# Patient Record
Sex: Male | Born: 1935 | Race: White | Hispanic: No | Smoking: Former smoker
Health system: Southern US, Community
[De-identification: ages and names within clinical notes are randomized; demographics above are authoritative.]

## PROBLEM LIST (undated history)

## (undated) DIAGNOSIS — N138 Other obstructive and reflux uropathy: Secondary | ICD-10-CM

## (undated) DIAGNOSIS — J310 Chronic rhinitis: Secondary | ICD-10-CM

## (undated) DIAGNOSIS — I8312 Varicose veins of left lower extremity with inflammation: Secondary | ICD-10-CM

## (undated) DIAGNOSIS — M5136 Other intervertebral disc degeneration, lumbar region: Secondary | ICD-10-CM

## (undated) DIAGNOSIS — K625 Hemorrhage of anus and rectum: Secondary | ICD-10-CM

## (undated) DIAGNOSIS — Z862 Personal history of diseases of the blood and blood-forming organs and certain disorders involving the immune mechanism: Secondary | ICD-10-CM

## (undated) DIAGNOSIS — M129 Arthropathy, unspecified: Secondary | ICD-10-CM

## (undated) DIAGNOSIS — Z872 Personal history of diseases of the skin and subcutaneous tissue: Secondary | ICD-10-CM

## (undated) DIAGNOSIS — R413 Other amnesia: Secondary | ICD-10-CM

## (undated) DIAGNOSIS — H9193 Unspecified hearing loss, bilateral: Secondary | ICD-10-CM

## (undated) DIAGNOSIS — H269 Unspecified cataract: Secondary | ICD-10-CM

## (undated) DIAGNOSIS — M1990 Unspecified osteoarthritis, unspecified site: Secondary | ICD-10-CM

## (undated) DIAGNOSIS — F329 Major depressive disorder, single episode, unspecified: Secondary | ICD-10-CM

## (undated) DIAGNOSIS — I679 Cerebrovascular disease, unspecified: Secondary | ICD-10-CM

## (undated) DIAGNOSIS — Z8679 Personal history of other diseases of the circulatory system: Secondary | ICD-10-CM

## (undated) DIAGNOSIS — N401 Enlarged prostate with lower urinary tract symptoms: Secondary | ICD-10-CM

## (undated) DIAGNOSIS — E785 Hyperlipidemia, unspecified: Secondary | ICD-10-CM

## (undated) DIAGNOSIS — N182 Chronic kidney disease, stage 2 (mild): Secondary | ICD-10-CM

## (undated) DIAGNOSIS — L280 Lichen simplex chronicus: Secondary | ICD-10-CM

## (undated) DIAGNOSIS — I2510 Atherosclerotic heart disease of native coronary artery without angina pectoris: Secondary | ICD-10-CM

## (undated) DIAGNOSIS — L509 Urticaria, unspecified: Secondary | ICD-10-CM

## (undated) HISTORY — DX: Chronic rhinitis: J31.0

## (undated) HISTORY — DX: Varicose veins of left lower extremity with inflammation: I83.12

## (undated) HISTORY — DX: Cerebrovascular disease, unspecified: I67.9

## (undated) HISTORY — DX: Other obstructive and reflux uropathy: N40.1

## (undated) HISTORY — PX: CORONARY ANGIOPLASTY WITH STENT PLACEMENT: SHX49

## (undated) HISTORY — PX: APPENDECTOMY: SHX54

## (undated) HISTORY — DX: Lichen simplex chronicus: L28.0

## (undated) HISTORY — DX: Other intervertebral disc degeneration, lumbar region: M51.36

## (undated) HISTORY — PX: TONSILLECTOMY: SUR1361

## (undated) HISTORY — DX: Benign prostatic hyperplasia with lower urinary tract symptoms: N13.8

## (undated) HISTORY — DX: Chronic kidney disease, stage 2 (mild): N18.2

## (undated) HISTORY — DX: Personal history of diseases of the skin and subcutaneous tissue: Z87.2

## (undated) HISTORY — DX: Personal history of diseases of the blood and blood-forming organs and certain disorders involving the immune mechanism: Z86.2

## (undated) HISTORY — DX: Unspecified hearing loss, bilateral: H91.93

## (undated) HISTORY — DX: Major depressive disorder, single episode, unspecified: F32.9

## (undated) HISTORY — DX: Unspecified osteoarthritis, unspecified site: M19.90

## (undated) HISTORY — DX: Urticaria, unspecified: L50.9

## (undated) HISTORY — DX: Unspecified cataract: H26.9

## (undated) HISTORY — DX: Hyperlipidemia, unspecified: E78.5

## (undated) HISTORY — DX: Other amnesia: R41.3

## (undated) HISTORY — DX: Atherosclerotic heart disease of native coronary artery without angina pectoris: I25.10

## (undated) HISTORY — DX: Hemorrhage of anus and rectum: K62.5

## (undated) HISTORY — DX: Personal history of other diseases of the circulatory system: Z86.79

## (undated) HISTORY — DX: Arthropathy, unspecified: M12.9

---

## 2001-12-23 ENCOUNTER — Emergency Department (HOSPITAL_COMMUNITY): Admission: EM | Admit: 2001-12-23 | Discharge: 2001-12-23 | Payer: Self-pay

## 2002-04-20 ENCOUNTER — Ambulatory Visit (HOSPITAL_COMMUNITY): Admission: RE | Admit: 2002-04-20 | Discharge: 2002-04-20 | Payer: Self-pay | Admitting: Internal Medicine

## 2002-04-20 ENCOUNTER — Encounter: Payer: Self-pay | Admitting: Internal Medicine

## 2003-12-04 ENCOUNTER — Ambulatory Visit: Payer: Self-pay | Admitting: Internal Medicine

## 2004-01-17 ENCOUNTER — Ambulatory Visit: Payer: Self-pay | Admitting: Internal Medicine

## 2004-05-23 ENCOUNTER — Ambulatory Visit: Payer: Self-pay | Admitting: Internal Medicine

## 2004-06-06 ENCOUNTER — Ambulatory Visit: Payer: Self-pay | Admitting: Internal Medicine

## 2004-06-27 ENCOUNTER — Ambulatory Visit: Payer: Self-pay | Admitting: Internal Medicine

## 2004-07-28 ENCOUNTER — Ambulatory Visit: Payer: Self-pay | Admitting: Internal Medicine

## 2004-07-29 ENCOUNTER — Ambulatory Visit: Payer: Self-pay | Admitting: Psychiatry

## 2004-07-29 ENCOUNTER — Other Ambulatory Visit (HOSPITAL_COMMUNITY): Admission: RE | Admit: 2004-07-29 | Discharge: 2004-08-18 | Payer: Self-pay | Admitting: Psychiatry

## 2005-04-22 ENCOUNTER — Ambulatory Visit: Payer: Self-pay | Admitting: Internal Medicine

## 2005-05-01 ENCOUNTER — Ambulatory Visit: Payer: Self-pay | Admitting: Internal Medicine

## 2005-05-07 ENCOUNTER — Encounter: Admission: RE | Admit: 2005-05-07 | Discharge: 2005-05-07 | Payer: Self-pay | Admitting: Internal Medicine

## 2005-07-29 ENCOUNTER — Ambulatory Visit: Payer: Self-pay | Admitting: Internal Medicine

## 2006-05-14 ENCOUNTER — Ambulatory Visit: Payer: Self-pay | Admitting: Internal Medicine

## 2006-05-14 LAB — CONVERTED CEMR LAB
CO2: 30 meq/L (ref 19–32)
GFR calc Af Amer: 120 mL/min
Glucose, Bld: 108 mg/dL — ABNORMAL HIGH (ref 70–99)
HDL: 39.3 mg/dL (ref >39.0)
LDL Cholesterol: 93 mg/dL (ref 0–99)
PSA: 0.25 ng/mL (ref 0.10–4.00)
Sodium: 143 meq/L (ref 135–145)
Total CHOL/HDL Ratio: 3.7

## 2006-12-06 ENCOUNTER — Encounter: Payer: Self-pay | Admitting: Internal Medicine

## 2006-12-06 ENCOUNTER — Ambulatory Visit: Payer: Self-pay | Admitting: Internal Medicine

## 2006-12-06 DIAGNOSIS — I2510 Atherosclerotic heart disease of native coronary artery without angina pectoris: Secondary | ICD-10-CM | POA: Insufficient documentation

## 2006-12-06 DIAGNOSIS — M129 Arthropathy, unspecified: Secondary | ICD-10-CM | POA: Insufficient documentation

## 2006-12-06 DIAGNOSIS — Z8679 Personal history of other diseases of the circulatory system: Secondary | ICD-10-CM

## 2006-12-06 DIAGNOSIS — F329 Major depressive disorder, single episode, unspecified: Secondary | ICD-10-CM

## 2006-12-06 DIAGNOSIS — E785 Hyperlipidemia, unspecified: Secondary | ICD-10-CM

## 2006-12-06 DIAGNOSIS — N400 Enlarged prostate without lower urinary tract symptoms: Secondary | ICD-10-CM | POA: Insufficient documentation

## 2006-12-06 DIAGNOSIS — F3289 Other specified depressive episodes: Secondary | ICD-10-CM | POA: Insufficient documentation

## 2006-12-06 DIAGNOSIS — I679 Cerebrovascular disease, unspecified: Secondary | ICD-10-CM

## 2006-12-06 DIAGNOSIS — I2583 Coronary atherosclerosis due to lipid rich plaque: Secondary | ICD-10-CM

## 2006-12-06 HISTORY — DX: Personal history of other diseases of the circulatory system: Z86.79

## 2006-12-06 HISTORY — DX: Other specified depressive episodes: F32.89

## 2006-12-06 HISTORY — DX: Major depressive disorder, single episode, unspecified: F32.9

## 2007-01-31 ENCOUNTER — Telehealth: Payer: Self-pay | Admitting: Internal Medicine

## 2007-06-17 ENCOUNTER — Encounter (INDEPENDENT_AMBULATORY_CARE_PROVIDER_SITE_OTHER): Payer: Self-pay | Admitting: Emergency Medicine

## 2007-06-17 ENCOUNTER — Ambulatory Visit: Payer: Self-pay | Admitting: Vascular Surgery

## 2007-06-17 ENCOUNTER — Emergency Department (HOSPITAL_COMMUNITY): Admission: EM | Admit: 2007-06-17 | Discharge: 2007-06-17 | Payer: Self-pay | Admitting: Emergency Medicine

## 2007-06-17 ENCOUNTER — Telehealth: Payer: Self-pay | Admitting: Internal Medicine

## 2007-06-20 ENCOUNTER — Telehealth (INDEPENDENT_AMBULATORY_CARE_PROVIDER_SITE_OTHER): Payer: Self-pay | Admitting: *Deleted

## 2007-07-06 DIAGNOSIS — I8312 Varicose veins of left lower extremity with inflammation: Secondary | ICD-10-CM

## 2007-07-06 HISTORY — DX: Varicose veins of left lower extremity with inflammation: I83.12

## 2007-07-07 ENCOUNTER — Ambulatory Visit: Payer: Self-pay | Admitting: Internal Medicine

## 2007-07-07 DIAGNOSIS — L02619 Cutaneous abscess of unspecified foot: Secondary | ICD-10-CM

## 2007-07-07 DIAGNOSIS — L03119 Cellulitis of unspecified part of limb: Secondary | ICD-10-CM

## 2007-07-18 ENCOUNTER — Ambulatory Visit: Payer: Self-pay | Admitting: Internal Medicine

## 2007-07-26 ENCOUNTER — Telehealth: Payer: Self-pay | Admitting: Internal Medicine

## 2007-08-01 ENCOUNTER — Ambulatory Visit: Payer: Self-pay | Admitting: Infectious Disease

## 2007-08-01 ENCOUNTER — Encounter: Payer: Self-pay | Admitting: Infectious Disease

## 2007-08-01 ENCOUNTER — Ambulatory Visit: Payer: Self-pay | Admitting: Vascular Surgery

## 2007-08-01 ENCOUNTER — Ambulatory Visit (HOSPITAL_COMMUNITY): Admission: RE | Admit: 2007-08-01 | Discharge: 2007-08-01 | Payer: Self-pay | Admitting: Infectious Disease

## 2007-08-01 DIAGNOSIS — H25019 Cortical age-related cataract, unspecified eye: Secondary | ICD-10-CM

## 2007-08-01 DIAGNOSIS — R413 Other amnesia: Secondary | ICD-10-CM

## 2007-08-01 LAB — CONVERTED CEMR LAB
Basophils Absolute: 0 10*3/uL (ref 0.0–0.1)
Basophils Relative: 0 % (ref 0–1)
CO2: 25 meq/L (ref 19–32)
Chloride: 104 meq/L (ref 96–112)
Eosinophils Absolute: 0.3 10*3/uL (ref 0.0–0.7)
Eosinophils Relative: 6 % — ABNORMAL HIGH (ref 0–5)
Glucose, Bld: 99 mg/dL (ref 70–99)
Hemoglobin: 14.3 g/dL (ref 13.0–17.0)
Lymphocytes Relative: 22 % (ref 12–46)
Lymphs Abs: 1.2 10*3/uL (ref 0.7–4.0)
MCV: 94.1 fL (ref 78.0–100.0)
Monocytes Absolute: 0.6 10*3/uL (ref 0.1–1.0)
Monocytes Relative: 12 % (ref 3–12)
Platelets: 226 10*3/uL (ref 150–400)
RBC: 4.76 M/uL (ref 4.22–5.81)
RDW: 12.9 % (ref 11.5–15.5)

## 2007-08-09 ENCOUNTER — Ambulatory Visit: Payer: Self-pay | Admitting: Infectious Disease

## 2007-08-09 DIAGNOSIS — B351 Tinea unguium: Secondary | ICD-10-CM

## 2007-08-09 DIAGNOSIS — I8310 Varicose veins of unspecified lower extremity with inflammation: Secondary | ICD-10-CM

## 2007-08-10 ENCOUNTER — Telehealth: Payer: Self-pay | Admitting: Internal Medicine

## 2007-08-25 ENCOUNTER — Encounter: Payer: Self-pay | Admitting: Internal Medicine

## 2007-08-30 ENCOUNTER — Encounter: Payer: Self-pay | Admitting: Internal Medicine

## 2007-08-30 ENCOUNTER — Telehealth: Payer: Self-pay | Admitting: Internal Medicine

## 2007-10-24 HISTORY — PX: CATARACT EXTRACTION: SUR2

## 2007-11-07 HISTORY — PX: CATARACT EXTRACTION: SUR2

## 2008-02-28 ENCOUNTER — Telehealth: Payer: Self-pay | Admitting: Internal Medicine

## 2008-03-19 ENCOUNTER — Ambulatory Visit: Payer: Self-pay | Admitting: Internal Medicine

## 2008-03-19 LAB — CONVERTED CEMR LAB
Albumin: 3.7 g/dL (ref 3.5–5.2)
Alkaline Phosphatase: 59 units/L (ref 39–117)
BUN: 28 mg/dL — ABNORMAL HIGH (ref 6–23)
Bilirubin, Direct: 0.1 mg/dL (ref 0.0–0.3)
GFR calc non Af Amer: 99 mL/min
HDL: 33.9 mg/dL — ABNORMAL LOW (ref >39.0)
LDL Cholesterol: 71 mg/dL (ref 0–99)
MCV: 91.4 fL (ref 78.0–100.0)
Monocytes Absolute: 0.5 10*3/uL (ref 0.1–1.0)
Monocytes Relative: 8.3 % (ref 3.0–12.0)
Neutro Abs: 3.6 10*3/uL (ref 1.4–7.7)
Platelets: 183 10*3/uL (ref 150–400)
Sodium: 143 meq/L (ref 135–145)
Total Bilirubin: 0.6 mg/dL (ref 0.3–1.2)
Total Protein: 6.6 g/dL (ref 6.0–8.3)
Vitamin B-12: 375 pg/mL (ref 211–911)
WBC: 5.5 10*3/uL (ref 4.5–10.5)

## 2008-06-12 ENCOUNTER — Telehealth: Payer: Self-pay | Admitting: Internal Medicine

## 2008-12-06 ENCOUNTER — Ambulatory Visit: Payer: Self-pay | Admitting: Internal Medicine

## 2008-12-06 DIAGNOSIS — R310 Gross hematuria: Secondary | ICD-10-CM

## 2008-12-06 DIAGNOSIS — R05 Cough: Secondary | ICD-10-CM

## 2008-12-06 DIAGNOSIS — K921 Melena: Secondary | ICD-10-CM

## 2009-01-29 ENCOUNTER — Telehealth: Payer: Self-pay | Admitting: Internal Medicine

## 2009-06-05 DIAGNOSIS — M5136 Other intervertebral disc degeneration, lumbar region: Secondary | ICD-10-CM

## 2009-06-05 DIAGNOSIS — M51369 Other intervertebral disc degeneration, lumbar region without mention of lumbar back pain or lower extremity pain: Secondary | ICD-10-CM

## 2009-06-05 DIAGNOSIS — M1990 Unspecified osteoarthritis, unspecified site: Secondary | ICD-10-CM

## 2009-06-05 HISTORY — DX: Other intervertebral disc degeneration, lumbar region without mention of lumbar back pain or lower extremity pain: M51.369

## 2009-06-05 HISTORY — DX: Unspecified osteoarthritis, unspecified site: M19.90

## 2009-06-05 HISTORY — DX: Other intervertebral disc degeneration, lumbar region: M51.36

## 2009-06-14 ENCOUNTER — Ambulatory Visit: Payer: Self-pay | Admitting: Internal Medicine

## 2009-06-14 DIAGNOSIS — I872 Venous insufficiency (chronic) (peripheral): Secondary | ICD-10-CM | POA: Insufficient documentation

## 2009-06-25 ENCOUNTER — Encounter: Admission: RE | Admit: 2009-06-25 | Discharge: 2009-06-25 | Payer: Self-pay | Admitting: Chiropractic Medicine

## 2009-10-11 ENCOUNTER — Telehealth: Payer: Self-pay | Admitting: Internal Medicine

## 2009-12-03 ENCOUNTER — Ambulatory Visit: Payer: Self-pay | Admitting: Internal Medicine

## 2009-12-03 DIAGNOSIS — R609 Edema, unspecified: Secondary | ICD-10-CM

## 2009-12-03 DIAGNOSIS — M79609 Pain in unspecified limb: Secondary | ICD-10-CM

## 2009-12-06 ENCOUNTER — Encounter: Payer: Self-pay | Admitting: Internal Medicine

## 2009-12-10 ENCOUNTER — Ambulatory Visit: Payer: Self-pay

## 2009-12-10 ENCOUNTER — Telehealth: Payer: Self-pay | Admitting: Internal Medicine

## 2009-12-10 ENCOUNTER — Encounter: Payer: Self-pay | Admitting: Internal Medicine

## 2009-12-18 ENCOUNTER — Ambulatory Visit: Payer: Self-pay | Admitting: Internal Medicine

## 2009-12-18 DIAGNOSIS — L989 Disorder of the skin and subcutaneous tissue, unspecified: Secondary | ICD-10-CM | POA: Insufficient documentation

## 2010-01-03 ENCOUNTER — Telehealth: Payer: Self-pay | Admitting: Internal Medicine

## 2010-02-04 NOTE — Progress Notes (Signed)
  Phone Note Refill Request Message from:  Fax from Pharmacy on October 11, 2009 11:58 AM  Refills Requested: Medication #1:  MELOXICAM 15 MG  TABS 1 by mouth once daily for arthiritic pain Please Advise refill for pt  Initial call taken by: Ami Bullins CMA,  October 11, 2009 11:58 AM  Follow-up for Phone Call        OK to refill as needed  Follow-up by: Jacques Navy MD,  October 11, 2009 1:29 PM    Prescriptions: MELOXICAM 15 MG  TABS (MELOXICAM) 1 by mouth once daily for arthiritic pain  #30 Each x 2   Entered by:   Bill Salinas CMA   Authorized by:   Jacques Navy MD   Signed by:   Bill Salinas CMA on 10/11/2009   Method used:   Electronically to        [facility]. [address] [phone]* (retail)       [address]       [place], [place]  [address]       Ph: [phone] or [phone]       Fax: [phone]   RxID:   [id number]

## 2010-02-04 NOTE — Assessment & Plan Note (Signed)
Summary: bruised,twisted r leg/men/cd   Vital Signs:  Patient profile:   77 year old male Height:      72 inches Weight:      261 pounds BMI:     35.53 Temp:     98.0 degrees F oral Pulse rate:   80 / minute Pulse rhythm:   regular Resp:     16 per minute BP sitting:   120 / 68  (left arm) Cuff size:   large  Vitals Entered By: Lanier Prude, CMA(AAMA) (December 03, 2009 2:57 PM) CC: Rt leg edema/pain/bruised.chair scooted out from under him he fell 1 wk ago Is Patient Diabetic? No   Primary Care Provider:  Norins  CC:  Rt leg edema/pain/bruised.chair scooted out from under him he fell 1 wk ago.  History of Present Illness: C/o R foot/leg pain and swelling after he fell off his chair (it scooted from underneath him); he hit his head too.  The pain and swelling got worse over past 2 dDenies HA, n/v. He has been active walking etc  Current Medications (verified): 1)  Plavix 75 Mg Tabs (Clopidogrel Bisulfate) .Marland Kitchen.. 1 Once Daily 2)  Simvastatin 80 Mg  Tabs (Simvastatin) .... Once Daily 3)  Adult Aspirin Low Strength 81 Mg  Tbdp (Aspirin) .... Once Daily 4)  Multivitamins   Tabs (Multiple Vitamin) .... Once Daily 5)  Effexor 75 Mg  Tabs (Venlafaxine Hcl) .... Take 1 Tablet By Mouth Two Times A Day 6)  Aspercreme 10 %  Lotn (Trolamine Salicylate) .... As Directed As Needed 7)  Xanax 0.5 Mg Tabs (Alprazolam) .... Take 1 Tablet By Mouth Once A Day 8)  Meloxicam 15 Mg  Tabs (Meloxicam) .Marland Kitchen.. 1 By Mouth Once Daily For Arthiritic Pain 9)  Lasix 20 Mg  Tabs (Furosemide) .... Take 1 Tablet By Mouth Once A Day 10)  Cialis 20 Mg Tabs (Tadalafil) .Marland Kitchen.. 1 Tab Several Times Per Week 11)  Tramadol Hcl 50 Mg Tabs (Tramadol Hcl) .Marland Kitchen.. 1 By Mouth Q8 Hours As Needed For Pain 12)  Metoprolol Succinate 25 Mg Xr24h-Tab (Metoprolol Succinate) .Marland Kitchen.. 1 By Mouth Once Daily For Blood Pressure 13)  Temazepam 15 Mg Caps (Temazepam) .Marland Kitchen.. 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 [place] married 31- divorced, married 9 years-divorce, married 10-divorce.  Had a companion who died. 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  Review of Systems       The patient complains of dyspnea on exertion.  The patient denies fever, chest pain, syncope, abdominal pain, and melena.    Physical Exam  General:  overwight white male in no distress Lungs:  normal respiratory effort and normal breath sounds.   Heart:  normal rate, regular rhythm, no murmur, and no JVD.   Abdomen:  soft, non-tender, normal bowel sounds, no distention, no masses, and no hepatomegaly.   Msk:  see below Extremities:  L ankle is with trace edema R distal 1/2 of his R shin and ankle with 2+ edema with pain and tenderness R anterior ankle is  bruised Neurologic:  Very hard of hearing   Impression & Recommendations:  Problem # 1:  EDEMA (ICD-782.3) R leg - contusion Assessment Deteriorated  His updated medication list for this problem includes:    Lasix 20 Mg Tabs (Furosemide) .Marland Kitchen... Take 1 tablet by mouth once a day Ultram prn Orders: T-Ankle Comp Right (73610TC) to r/o fracture  Doppler Referral (Doppler) to r/o DVT Ace Wraps 3-5 in/yard  (Z6109)  Problem # 2:  LEG PAIN (ICD-729.5) R Assessment: New See "Patient Instructions".  Orders: T-Ankle Comp Right (73610TC) Doppler  Referral (Doppler) Ace Wraps 3-5 in/yard  ([id number])  Problem # 3:  VENOUS INSUFFICIENCY, LEGS (ICD-459.81) Assessment: Deteriorated as above  Complete Medication List: 1)  Plavix 75 Mg Tabs (Clopidogrel bisulfate) .Marland Kitchen.. 1 once daily 2)  Simvastatin 80 Mg Tabs (Simvastatin) .... Once daily 3)  Adult Aspirin Low Strength 81 Mg Tbdp (Aspirin) .... Once daily 4)  Multivitamins Tabs (Multiple vitamin) .... Once daily 5)  Effexor 75 Mg Tabs (Venlafaxine hcl) .... Take 1 tablet by mouth two times a day 6)  Aspercreme 10 % Lotn (Trolamine salicylate) .... As directed as needed 7)  Xanax 0.5 Mg Tabs (Alprazolam) .... Take 1 tablet by mouth once a day 8)  Meloxicam 15 Mg Tabs (Meloxicam) .Marland Kitchen.. 1 by mouth once daily for arthiritic pain 9)  Lasix 20 Mg Tabs (Furosemide) .... Take 1 tablet by mouth once a day 10)  Cialis 20 Mg Tabs (Tadalafil) .Marland Kitchen.. 1 tab several times per week 11)  Tramadol Hcl 50 Mg Tabs (Tramadol hcl) .Marland Kitchen.. 1 by mouth q8 hours as needed for pain 12)  Metoprolol Succinate 25 Mg Xr24h-tab (Metoprolol succinate) .Marland Kitchen.. 1 by mouth once daily for blood pressure 13)  Temazepam 15 Mg Caps (Temazepam) .Marland Kitchen.. 1 by mouth at bedtime  Patient Instructions: 1)  Elevate L leg  2)  Please schedule a follow-up appointment in 1-2 weeks w/Dr Norins. 3)  Call if you are not better in a reasonable amount of time or if worse. Go to ER if feeling really bad!    Orders Added: 1)  T-Ankle Comp Right [73610TC] 2)  Doppler Referral [Doppler] 3)  Ace Wraps 3-5 in/yard  [A6449] 4)  Est. Patient Level IV [09811]

## 2010-02-04 NOTE — Progress Notes (Signed)
  Phone Note Refill Request Message from:  Fax from Pharmacy on January 29, 2009 1:47 PM  Refills Requested: Medication #1:  MELOXICAM 15 MG  TABS 1 by mouth once daily for arthiritic pain   Last Refilled: 12/31/2008 please Advise refill  Initial call taken by: Ami Bullins CMA,  January 29, 2009 1:47 PM  Follow-up for Phone Call        OK for as needed refills Follow-up by: Jacques Navy MD,  January 29, 2009 5:27 PM    Prescriptions: MELOXICAM 15 MG  TABS (MELOXICAM) 1 by mouth once daily for arthiritic pain  #30.0 Each x 0   Entered by:   Bill Salinas CMA   Authorized by:   Jacques Navy MD   Signed by:   Bill Salinas CMA on 01/30/2009   Method used:   Electronically to        [facility]. [address] [phone]* (retail)       [address]       [place], [place]  [address]       Ph: [phone] or [phone]       Fax: [phone]   RxID:   [id number]

## 2010-02-04 NOTE — Progress Notes (Signed)
  Phone Note Other Incoming   Caller: Harriet w/ vascular department Summary of Call: Patient there for study and it is neg for blood clot but look like cellulitis. Per pt he is currently being treated for L leg fungal infection w/ ampicillian ( given by Dr Okey Dupre w/ dermatology).  Per Dr AP, ok to pt to go home until futher advisment. Initial call taken by: Rock Nephew CMA,  December 10, 2009 9:43 AM  Follow-up for Phone Call        Pls ask him to take Lasix 2 a day  Stop Anoxicillin Start Doxy Keep return office visit Dr Debby Bud Follow-up by: Tresa Garter MD,  December 10, 2009 9:46 AM  Additional Follow-up for Phone Call Additional follow up Details #1::        Called pt no ansew [facility] RTC Additional Follow-up by: Orlan Leavens RMA,  December 10, 2009 12:38 PM    Additional Follow-up for Phone Call Additional follow up Details #2::    Pt informed, he is scheduled for f/u wednesday.  Follow-up by: Lamar Sprinkles, CMA,  December 11, 2009 10:15 AM  New/Updated Medications: DOXYCYCLINE HYCLATE 100 MG CAPS (DOXYCYCLINE HYCLATE) 1 by mouth two times a day with a glass of water Prescriptions: DOXYCYCLINE HYCLATE 100 MG CAPS (DOXYCYCLINE HYCLATE) 1 by mouth two times a day with a glass of water  #20 x 0   Entered and Authorized by:   Tresa Garter MD   Signed by:   Rock Nephew CMA on 12/10/2009   Method used:   Electronically to        [facility]. [address] [phone]* (retail)       [address]       [place], [place]  [address]       Ph: [phone] or [phone]       Fax: [phone]   RxID:   [id number]

## 2010-02-04 NOTE — Assessment & Plan Note (Signed)
Summary: swelling in legs and ankles-lb   Vital Signs:  Patient profile:   77 year old male Height:      72 inches Weight:      266 pounds BMI:     36.21 O2 Sat:      95 % on Room air Temp:     97.6 degrees F oral Pulse rate:   98 / minute BP sitting:   150 / 72  (left arm) Cuff size:   large  Vitals Entered By: Lucious Groves (June 14, 2009 5:14 PM)  O2 Flow:  Room air CC: C/O increased swelling in legs and akles./kb Comments Patient also notes that he has bleeding sores./kb   Primary Care Provider:  Davontay Watlington  CC:  C/O increased swelling in legs and akles./kb.  History of Present Illness: Preents for peripheral edema with some weepong of fluid but no ulcers or skin breakdown. He does not have active heart or renal failure. He has been feeling OK. His companion verifies that he has been doing OK. He does take furosemide 20mg  daily for BP management.   Current Medications (verified): 1)  Plavix 75 Mg Tabs (Clopidogrel Bisulfate) .Marland Kitchen.. 1 Once Daily 2)  Simvastatin 80 Mg  Tabs (Simvastatin) .... Once Daily 3)  Adult Aspirin Low Strength 81 Mg  Tbdp (Aspirin) .... Once Daily 4)  Multivitamins   Tabs (Multiple Vitamin) .... Once Daily 5)  Effexor 75 Mg  Tabs (Venlafaxine Hcl) .... Take 1 Tablet By Mouth Two Times A Day 6)  Aspercreme 10 %  Lotn (Trolamine Salicylate) .... As Directed As Needed 7)  Xanax 0.5 Mg Tabs (Alprazolam) .... Take 1 Tablet By Mouth Once A Day 8)  Meloxicam 15 Mg  Tabs (Meloxicam) .Marland Kitchen.. 1 By Mouth Once Daily For Arthiritic Pain 9)  Lasix 20 Mg  Tabs (Furosemide) .... Take 1 Tablet By Mouth Once A Day 10)  Cialis 20 Mg Tabs (Tadalafil) .Marland Kitchen.. 1 Tab Several Times Per Week 11)  Rybix Odt 50 Mg Tbso (Tramadol Hcl) .Marland Kitchen.. 1 By Mouth As Needed For Pain  Allergies (verified): No Known Drug Allergies  Past History:  Past Medical History: Last updated: 03/19/2008 ONYCHOMYCOSIS, TOENAILS (ICD-110.1) DERMATITIS, STASIS (ICD-454.1) MEMORY LOSS (ICD-780.93) CORTICAL  SENILE CATARACT (ICD-366.15) CELLULITIS AND ABSCESS OF FOOT EXCEPT TOES (ICD-682.7) BENIGN PROSTATIC HYPERTROPHY (ICD-600.00) ARTHRITIS (ICD-716.90) CEREBROVASCULAR DISEASE (ICD-437.9) HYPERLIPIDEMIA (ICD-272.4) DEPRESSION (ICD-311) TRANSIENT ISCHEMIC ATTACK, HX OF (ICD-V12.50) CORONARY ARTERY DISEASE (ICD-414.00)  Past Surgical History: Last updated: 03/19/2008 Appendectomy PTCA/stent Tonsillectomy Cataract extraction (10/24/2007)-right Cataract extraction (11/07/2007)-left  Family History: Last updated: Jan 03, 2007 father- died 88, CAD/MI, CVa mother- died 46, HTN sister- mild dementia Neg - colon, prostate cancer, DM  Social History: Last updated: 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  Had a companion who died. 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  Risk Factors: Caffeine Use: 2 (08/09/2007) Exercise: no (2007/01/03)  Risk Factors: Smoking Status: quit (01/03/07) Passive Smoke Exposure: no (08/09/2007)  Review of Systems  The patient denies anorexia, fever, chest pain, dyspnea on exertion, and abdominal pain.    Physical Exam  General:  overwight white male in no distress Head:  normocephalic and atraumatic.   Lungs:  normal respiratory effort and normal breath sounds.   Heart:  normal rate, regular rhythm, no murmur, and no JVD.   Pulses:  2+ radial pulse Extremities:  2+ LE edema to below the knee.  Neurologic:  Very hard of hearing Skin:  No ulceration. Distal left leg with erythematous stiff skin change but no open wound. There is serosanguinous fluid from the leg to touch. Psych:  Oriented X3, memory intact for recent and remote, and good eye contact.     Impression & Recommendations:  Problem # 1:  VENOUS INSUFFICIENCY, LEGS (ICD-459.81) Explained mechanism of disease. Reviewed chart - no evidence to support anasarce,  biventricular failure or renal failure.  Plan - continue BP meds           elevate legs           knee high support hose - otc men's stockings as first step.   Complete Medication List: 1)  Plavix 75 Mg Tabs (Clopidogrel bisulfate) .Marland Kitchen.. 1 once daily 2)  Simvastatin 80 Mg Tabs (Simvastatin) .... Once daily 3)  Adult Aspirin Low Strength 81 Mg Tbdp (Aspirin) .... Once daily 4)  Multivitamins Tabs (Multiple vitamin) .... Once daily 5)  Effexor 75 Mg Tabs (Venlafaxine hcl) .... Take 1 tablet by mouth two times a day 6)  Aspercreme 10 % Lotn (Trolamine salicylate) .... As directed as needed 7)  Xanax 0.5 Mg Tabs (Alprazolam) .... Take 1 tablet by mouth once a day 8)  Meloxicam 15 Mg Tabs (Meloxicam) .Marland Kitchen.. 1 by mouth once daily for arthiritic pain 9)  Lasix 20 Mg Tabs (Furosemide) .... Take 1 tablet by mouth once a day 10)  Cialis 20 Mg Tabs (Tadalafil) .Marland Kitchen.. 1 tab several times per week 11)  Tramadol Hcl 50 Mg Tabs (Tramadol hcl) .Marland Kitchen.. 1 by mouth q8 hours as needed for pain 12)  Metoprolol Succinate 25 Mg Xr24h-tab (Metoprolol succinate) .Marland Kitchen.. 1 by mouth once daily for blood pressure  Patient Instructions: 1)  Leg swelling - most likely venous insufficiency = a problem with the venous circulation. Treatment is mechanical: elevate your legs higher than your heart for every 3 hrs; knee high support stockings (over the counter) that you put on first thing in the morning. 2)  Blood pressure - 150/72 too high, especially for a man with known coronary heart disease. Plan - continue lasix 20mg  daily (for blood pressure not swelling), add  metoprolol xr 25 mg once a day for control and also cardiac benefit. 3)  For arthritis pain - DO NOT take any supplemental anti-inflammatory drugs, e.g. naproxen, ibuprofen in addition to the meloxicam. OK to take tylenol up to 1000mg  three times a day as needed. OK for tramadol 50mg  every 8 hours as needed. Prescriptions: TRAMADOL HCL 50 MG TABS (TRAMADOL HCL) 1 by  mouth q8 hours as needed for pain  #30 x 5   Entered and Authorized by:   Jacques Navy MD   Signed by:   Jacques Navy MD on 06/14/2009   Method used:   Electronically to        [facility] [address] [phone]* (retail)       [address]       [place], [place]  [address]       Ph: [phone] or [phone]       Fax: [phone]   RxID:   [id number] METOPROLOL SUCCINATE 25 MG XR24H-TAB (METOPROLOL SUCCINATE) 1 by mouth once daily for blood pressure  #30 x 12   Entered and Authorized by:   Jacques Navy MD   Signed by:   Jacques Navy MD on 06/14/2009   Method used:  Electronically to        [facility]. [address] [phone]* (retail)       [address]       [place], [place]  [address]       Ph: [phone] or [phone]       Fax: [phone]   RxID:   [id number]

## 2010-02-04 NOTE — Miscellaneous (Signed)
Summary: Orders Update  Clinical Lists Changes  Orders: Added new Test order of Venous Duplex Lower Extremity (Venous Duplex Lower) - Signed 

## 2010-02-06 NOTE — Progress Notes (Signed)
  Phone Note Other Incoming   Caller: Pharm Summary of Call: Pharm called to get clarification on Furosemide 20 mg. We had filled it for 20mg  once a day and the pt states he takes 20 mg twice a day. I spoke with Dr Debby Bud and he advise ok for Furosemide 20mg  two times a day . Informed pharmacist. Initial call taken by: Ami Bullins CMA,  January 03, 2010 5:00 PM

## 2010-02-06 NOTE — Assessment & Plan Note (Signed)
Summary: 1-2 WK ROV /NWS   Vital Signs:  Patient profile:   77 year old male Height:      72 inches Weight:      259 pounds BMI:     35.25 O2 Sat:      98 % on Room air Temp:     98.0 degrees F oral Pulse rate:   70 / minute BP sitting:   132 / 72  (left arm) Cuff size:   large  Vitals Entered By: Bill Salinas CMA (December 18, 2009 10:49 AM)  O2 Flow:  Room air CC: pt here for f/u from fall brusing right leg. Pt had lower venous examination  12/10/2009 with no evidence of right lower extremity deep or superficial venous thrombus or incompetence. Pt was given ampicillin by Dr Okey Dupre for cellulitis and Dr Macario Golds changed that to doxycycline, pt has also been taking 2 furosemides instead of 1 / ab   Primary Care Provider:  Norins  CC:  pt here for f/u from fall brusing right leg. Pt had lower venous examination  12/10/2009 with no evidence of right lower extremity deep or superficial venous thrombus or incompetence. Pt was given ampicillin by Dr Okey Dupre for cellulitis and Dr Macario Golds changed that to doxycycline and pt has also been taking 2 furosemides instead of 1 / ab.  History of Present Illness: Patient presents for follow-up of cellulitis. He was seen by Dr. AVP a bout 1 week ago. He had x-ray right ankle - reveiwed report - negative. He had lower extremity venous doppler study - report reviewed- negative. He was started on doxycycline for cellulitis of the right leg . His legs feel  better. The bruising from his fall are resolved (resolveing). He has had no fever or chills or significant leg pain.   Current Medications (verified): 1)  Plavix 75 Mg Tabs (Clopidogrel Bisulfate) .Marland Kitchen.. 1 Once Daily 2)  Simvastatin 80 Mg  Tabs (Simvastatin) .... Once Daily 3)  Adult Aspirin Low Strength 81 Mg  Tbdp (Aspirin) .... Once Daily 4)  Multivitamins   Tabs (Multiple Vitamin) .... Once Daily 5)  Effexor 75 Mg  Tabs (Venlafaxine Hcl) .... Take 1 Tablet By Mouth Two Times A Day 6)  Aspercreme 10 %  Lotn  (Trolamine Salicylate) .... As Directed As Needed 7)  Xanax 0.5 Mg Tabs (Alprazolam) .... Take 1 Tablet By Mouth Once A Day 8)  Meloxicam 15 Mg  Tabs (Meloxicam) .Marland Kitchen.. 1 By Mouth Once Daily For Arthiritic Pain 9)  Lasix 20 Mg  Tabs (Furosemide) .... Take 1 Tablet By Mouth Once A Day 10)  Cialis 20 Mg Tabs (Tadalafil) .Marland Kitchen.. 1 Tab Several Times Per Week 11)  Tramadol Hcl 50 Mg Tabs (Tramadol Hcl) .Marland Kitchen.. 1 By Mouth Q8 Hours As Needed For Pain 12)  Metoprolol Succinate 25 Mg Xr24h-Tab (Metoprolol Succinate) .Marland Kitchen.. 1 By Mouth Once Daily For Blood Pressure 13)  Temazepam 15 Mg Caps (Temazepam) .Marland Kitchen.. 1 By Mouth At Bedtime 14)  Doxycycline Hyclate 100 Mg Caps (Doxycycline Hyclate) .Marland Kitchen.. 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) CELLULITIS AND ABSCESS OF FOOT EXCEPT TOES (ICD-682.7) BENIGN PROSTATIC HYPERTROPHY (ICD-600.00) ARTHRITIS (ICD-716.90) CEREBROVASCULAR DISEASE (ICD-437.9) HYPERLIPIDEMIA (ICD-272.4) DEPRESSION (ICD-311) TRANSIENT ISCHEMIC ATTACK, HX OF (ICD-V12.50) CORONARY ARTERY DISEASE (ICD-414.00)  Past Surgical History: Last updated: 03/19/2008 Appendectomy  PTCA/stent Tonsillectomy Cataract extraction (10/24/2007)-right Cataract extraction (11/07/2007)-left FH reviewed for relevance, SH/Risk Factors reviewed for relevance  Review of Systems       The patient complains of decreased hearing and peripheral edema.  The patient denies anorexia, fever, weight loss, weight gain, chest pain, dyspnea on exertion, prolonged cough, abdominal pain, muscle weakness, difficulty walking, enlarged lymph nodes, and angioedema.    Physical Exam  General:  heavy-set white male in no distress Head:  normocephalic and atraumatic.   Eyes:  vision  grossly intact, pupils equal, and pupils round.   Neck:  supple.   Lungs:  normal respiratory effort.   Heart:  normal rate and regular rhythm.   Extremities:  1-2 + LE edema Neurologic:  alert & oriented X3 and cranial nerves II-XII intact.   Skin:  area of hyperemia on the shin of both legs with minimal warmth.  Several erythematous lesions on the face withou signs of obvious secondary infection.  Psych:  Oriented X3, memory intact for recent and remote, normally interactive, and good eye contact.     Impression & Recommendations:  Problem # 1:  LEG PAIN (ICD-729.5) Infection appears to have resolved.  No further antibiotics for cellulitis is needed.   To prevent further problems and swelling - continue to use knee high support hose.  Problem # 2:  DERMATITIS, STASIS (ICD-454.1) see #1  Problem # 3:  SKIN LESIONS, MULTIPLE (ICD-709.9) IN regard to the continued outbreak of lesions on the face and back best to stick with "one cook" in this case the dermatologist. They can decide if you need chronic antibiotics and if so which one.   Complete Medication List: 1)  Plavix 75 Mg Tabs (Clopidogrel bisulfate) .Marland Kitchen.. 1 once daily 2)  Simvastatin 80 Mg Tabs (Simvastatin) .... Once daily 3)  Adult Aspirin Low Strength 81 Mg Tbdp (Aspirin) .... Once daily 4)  Multivitamins Tabs (Multiple vitamin) .... Once daily 5)  Effexor 75 Mg Tabs (Venlafaxine hcl) .... Take 1 tablet by mouth two times a day 6)  Aspercreme 10 % Lotn (Trolamine salicylate) .... As directed as needed 7)  Xanax 0.5 Mg Tabs (Alprazolam) .... Take 1 tablet by mouth once a day 8)  Meloxicam 15 Mg Tabs (Meloxicam) .Marland Kitchen.. 1 by mouth once daily for arthiritic pain 9)  Lasix 20 Mg Tabs (Furosemide) .... Take 1 tablet by mouth once a day 10)  Cialis 20 Mg Tabs (Tadalafil) .Marland Kitchen.. 1 tab several times per week 11)  Tramadol Hcl 50 Mg Tabs (Tramadol hcl) .Marland Kitchen.. 1 by mouth q8 hours as needed for pain 12)  Metoprolol Succinate 25 Mg Xr24h-tab  (Metoprolol succinate) .Marland Kitchen.. 1 by mouth once daily for blood pressure 13)  Temazepam 15 Mg Caps (Temazepam) .Marland Kitchen.. 1 by mouth at bedtime   Orders Added: 1)  Est. Patient Level III [27253]

## 2010-04-10 ENCOUNTER — Other Ambulatory Visit: Payer: Self-pay | Admitting: Internal Medicine

## 2010-05-02 IMAGING — CR DG FOOT COMPLETE 3+V*L*
3 series · 3 of 3 positions shown · non-contrast
Comparison: None available.

CLINICAL DATA: Fall.  Pain.

LEFT FOOT - COMPLETE 3+ VIEW

[view not recorded (1 of 3)]
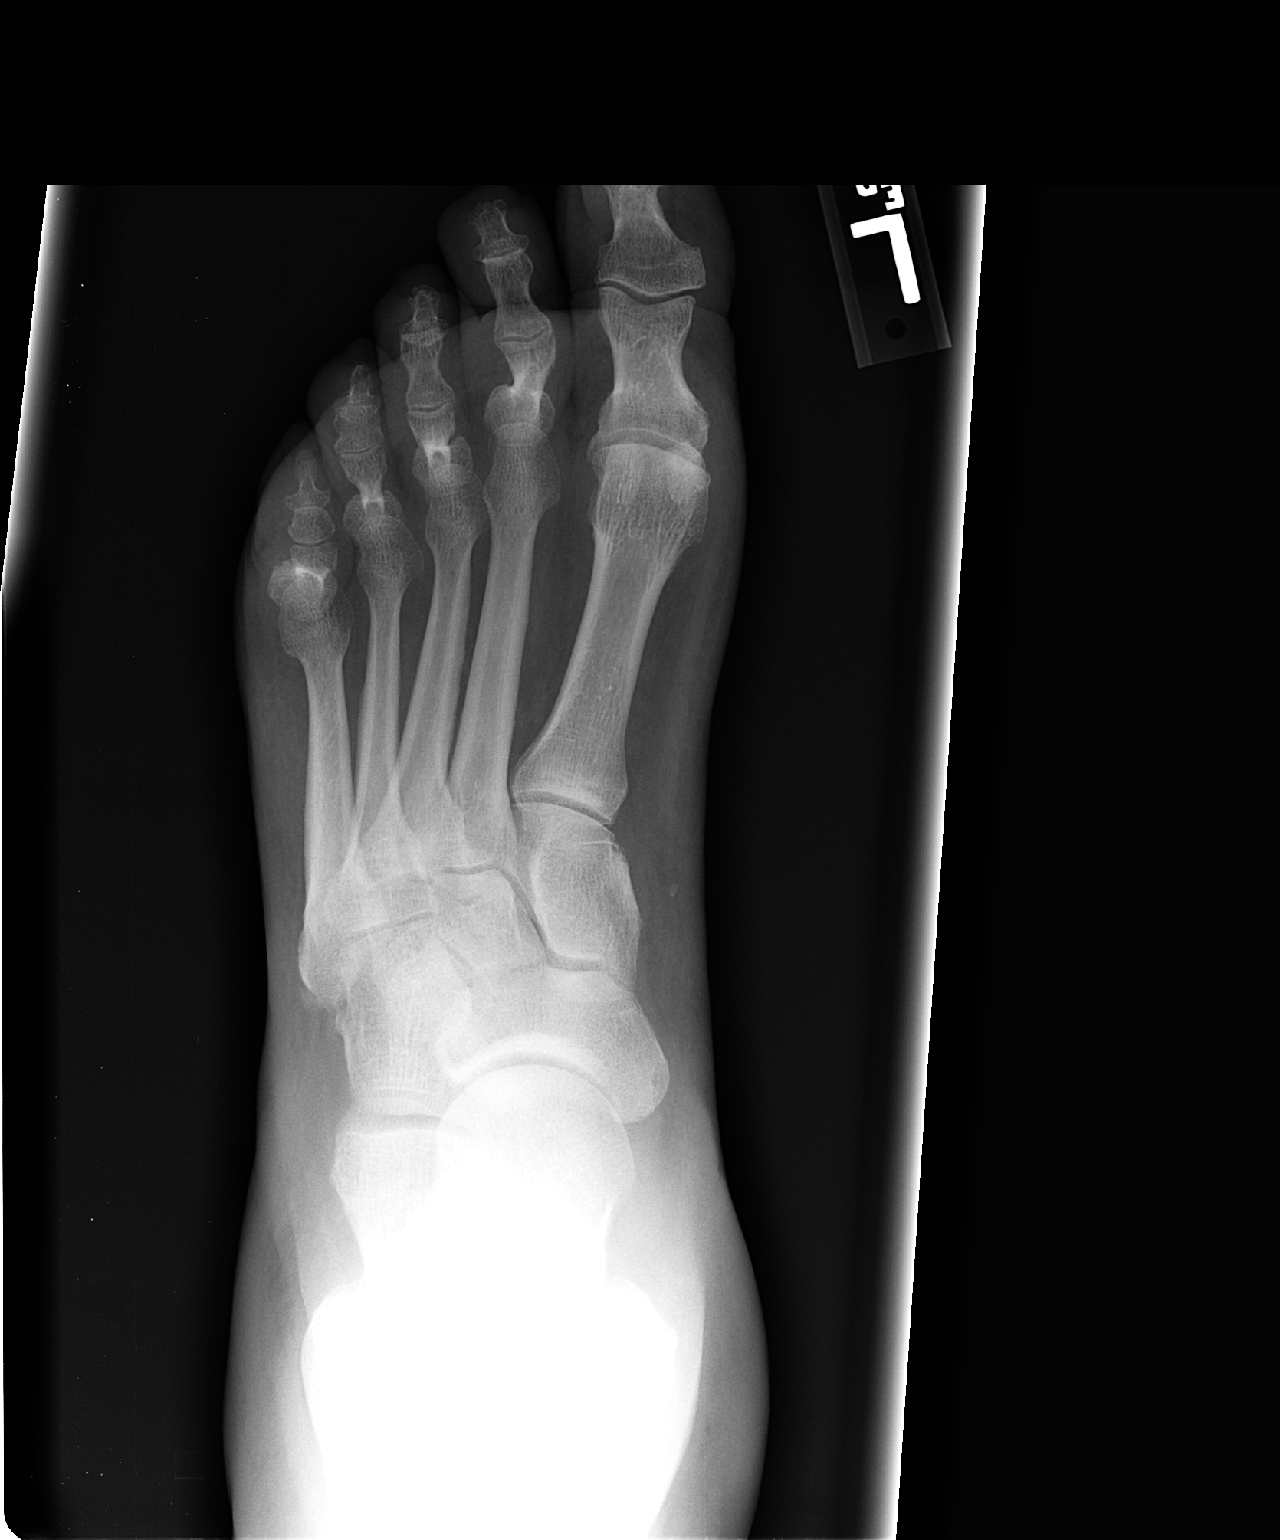

[view not recorded (2 of 3)]
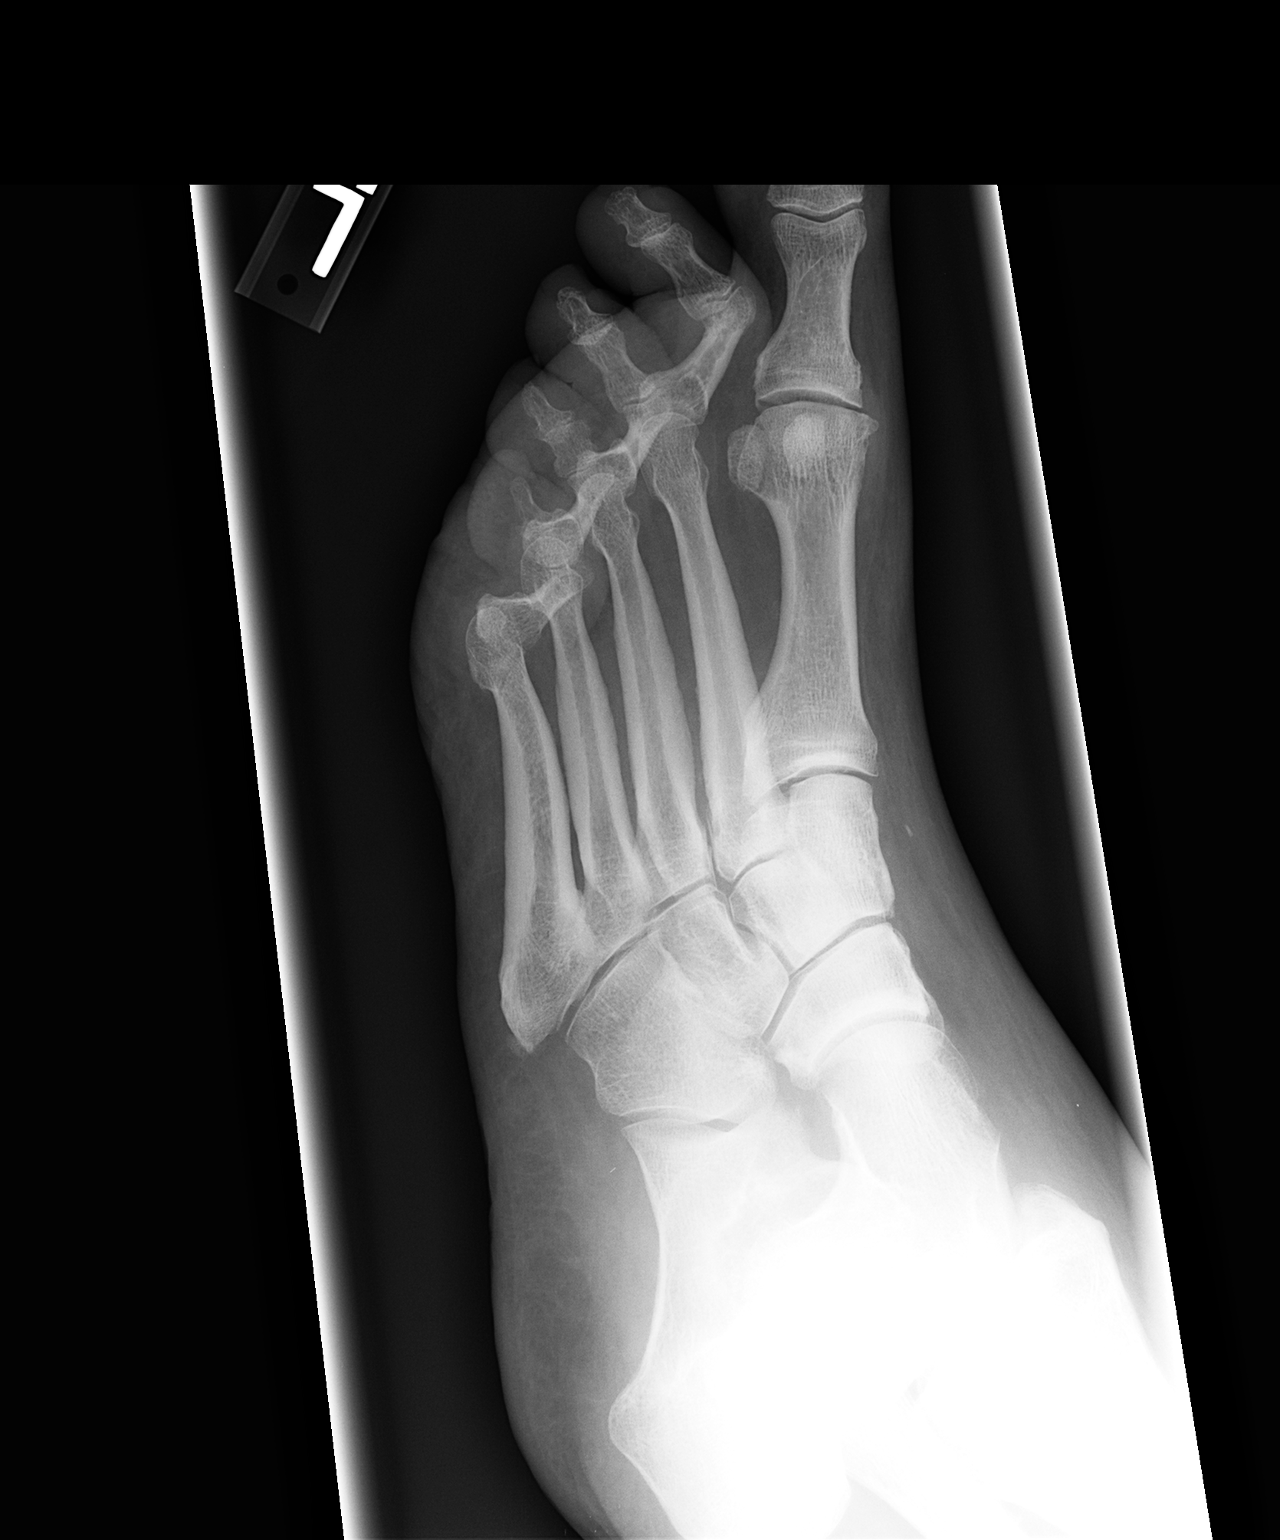

[view not recorded (3 of 3)]
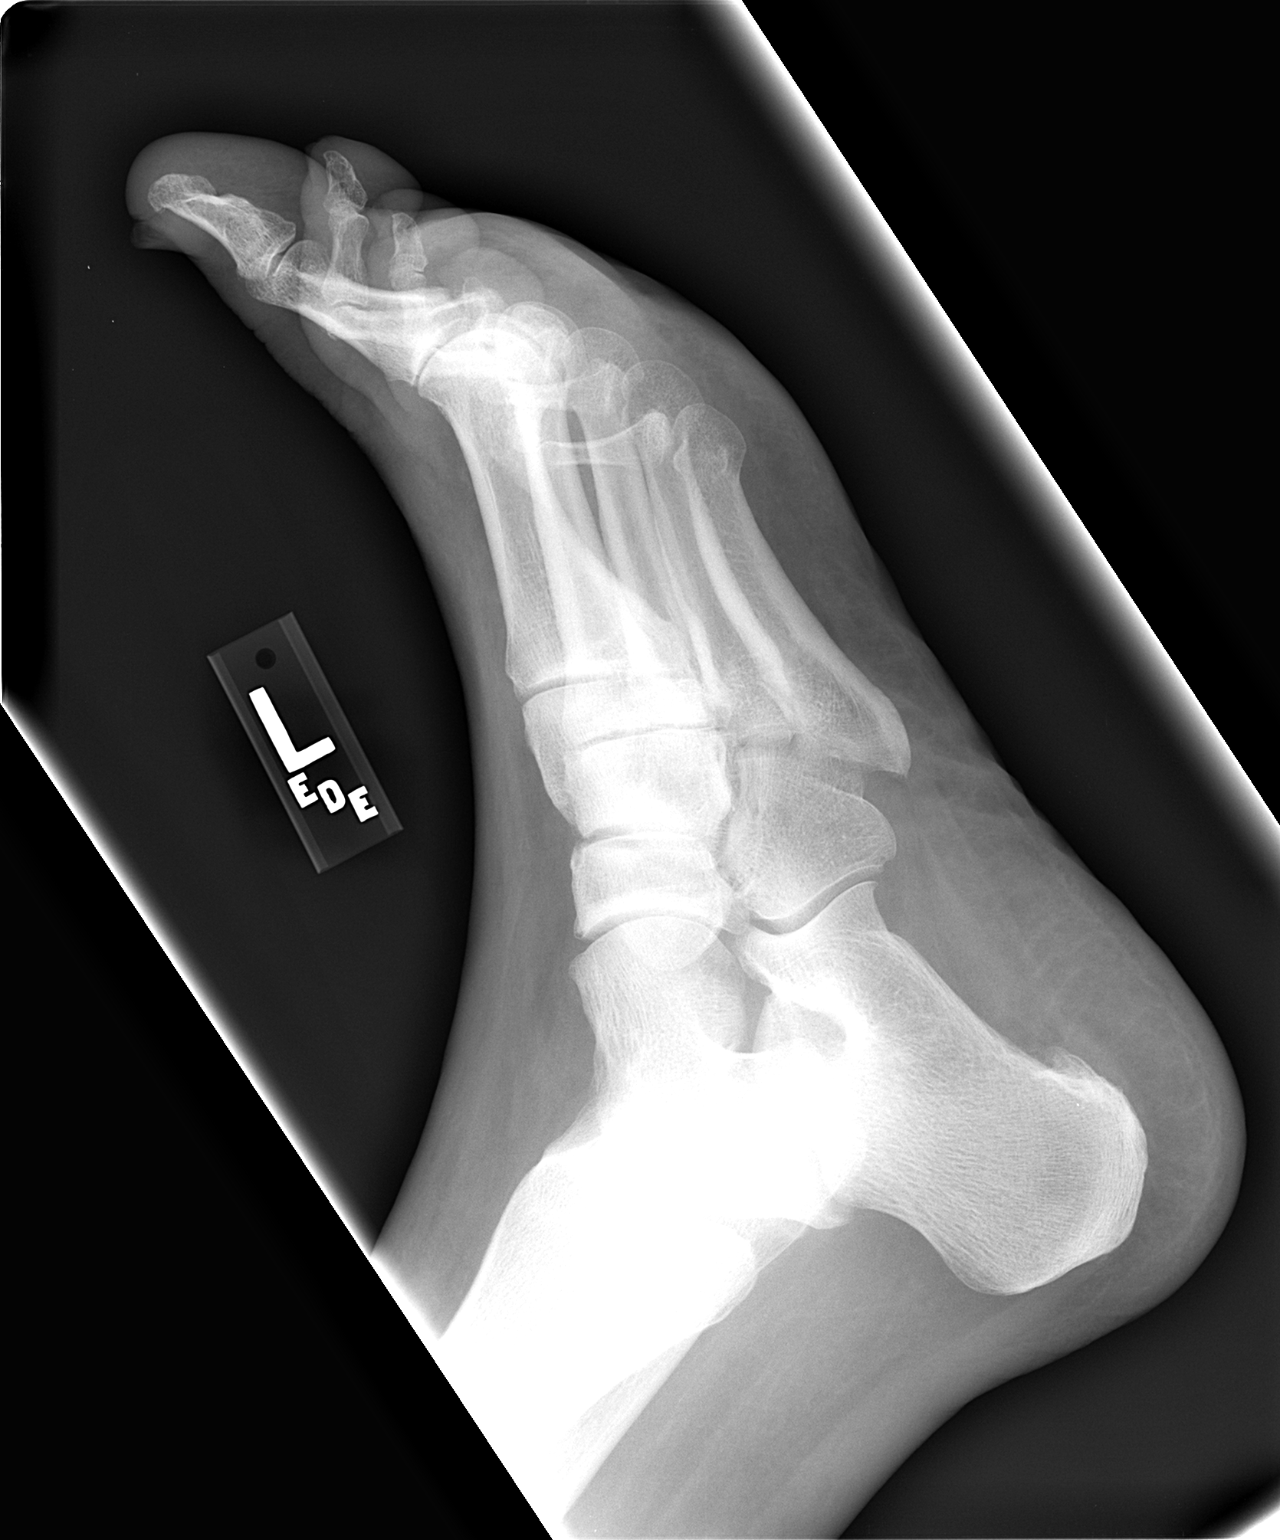

[3 of 3 positions shown; findings below may reference images not displayed]

FINDINGS: There is no acute bony or joint abnormality.  The patient
has mild first MTP degenerative change.  There is some soft tissue
swelling about the foot.
IMPRESSION: No acute abnormality.

## 2010-05-20 NOTE — Assessment & Plan Note (Signed)
[facility]                           PRIMARY CARE OFFICE NOTE   GEARLD, KERSTEIN                    MRN:          [id number]  DATE:05/14/2006                            DOB:          Aug 12, 1925    Jeff Hunt is a 77 year old gentleman followed for CAD, history of  TIAs with known cerebrovascular disease, arthritis, BPH, ED, and  significant depression.  He was last seen in the office July 29, 2005.  Please see that dictation.   CHIEF COMPLAINT:  1. Bladder incontinence.  The patient has what sounds like overflow      incontinence secondary to BPH.  2. Fecal soilage.  The patient complains of what he initially called      fecal incontinence, which actually sounds like fecal soilage with a      small stain on his underwear with no frank bowel movement.  3. Hemorrhoids.  The patient reports he has noted frank blood with      bowel movements.  He has had colonoscopy July 26, 2002, which      demonstrated significant internal hemorrhoids.  4. Intermittent urticaria, which is minor, requiring no medications.   PAST MEDICAL HISTORY:  Please see my note of May 01, 2005 for a  complete outline of the patient's past medical history, family history,  and social history.   CURRENT MEDICATIONS:  1. Simvastatin 80 mg daily.  2. Plavix 75 mg daily.  3. Aspirin 81 mg daily.  4. Multivitamin daily.  5. Effexor 150 mg daily.  6. Lorazepam 1 mg p.r.n.  7. Astelin nasal spray.  8. Viagra p.r.n.  9. Ambien 10 mg nightly.   PHYSICIAN ROSTER:  Remains the same with Dr. Donell Beers for psychiatry, Dr.  Arlyce Dice for GI.   REVIEW OF SYSTEMS:  Negative for any constructional problems.  The  patient has not had an eye exam for 24 months or more.  No ENT,  cardiovascular, or respiratory problems.  Occasional heartburn treated  with OTC antacids.  No GU complaints.  The patient does have ongoing  left knee and right hip pain.  No dermatologic, neurologic  problems.  Psychiatric is stable.   EXAMINATION:  Temperature is 97.6, blood pressure 154/84, pulse 78,  weight 238.  GENERAL APPEARANCE:  This is a pleasant gentleman in no acute distress.  HEENT:  Normocephalic, atraumatic.  EACs were occluded by cerumen.  Following irrigation, the patient had irrigation and inflammation of the  ear canal.  TMs normal.  Oropharynx with no buccal or palatal lesions  were noted.  Posterior pharynx was clear.  Conjunctivae, sclerae clear.  Pupils are equal, round, and reactive to light and accommodation.  NECK:  Supple without thyromegaly.  NODES:  No adenopathy was noted in the cervical or supraclavicular  regions.  CHEST:  No CVA tenderness.  LUNGS:  Clear to auscultation and percussion.  CARDIOVASCULAR:  2+ radial pulse.  No JVD.  No carotid bruits.  He had a  quiet precordium with regular rate and rhythm without murmurs, rubs, or  gallops.  ABDOMEN:  Obese, soft, no guarding or rebound.  Palpation  was hindered  by his girth with no frank organo-splenomegaly or masses noted.  RECTAL:  Normal sphincter tone was noted.  Prostate was generous in  size, but no nodules.  No internal palpable hemorrhoids were noted.  EXTREMITIES:  Without cyanosis, clubbing, or edema, deformity.  NEUROLOGIC:  Nonfocal.   LABORATORY:  Basic metabolic panel was unremarkable with a glucose of  108, creatinine 0.8.  Electrolytes were normal.  Liver function tests  normal.  Lipid profile with a cholesterol of 144, triglycerides 58, HDL  39.3, LDL 93.  PSA was normal at 0.25.   ASSESSMENT AND PLAN:  1. Hyperlipidemia, stable and well controlled.  The patient will      continue his present dose of simvastatin.  2. Cardiovascular.  The patient with no cardiovascular complaints and      a normal exam.  He is mildly hypertensive at today's visit.  Would      encourage the patient to have blood pressure checks out of the      office and if they continue to run with systolic  greater than 140,      would need to consider the addition of antihypertensive      medications.  3. Gastrointestinal.  The patient seems to be stable with no frank      bleeding.  He does seem to have hemorrhoidal bleeding by history.      Plan:  Anusol HC 2.5% suppositories to be used b.i.d. whenever he      has a flare of hemorrhoids, and I would recommend at least a 6-day      course at this time.  Would not pursue further evaluation for fecal      soilage.  4. Genitourinary.  The patient with what sounds like mild benign      prostatic hypertrophy with bladder overflow incontinence.  Plan:      No medications are recommended at this time.  Will have the patient      have a regular toilet protocol to avoid overfilling of the bladder.  5. Cerumen impaction, resolved with irrigation.  However, there was      significant irritation of the EAC bilaterally.  Plan:  Use Ciprodex      drops to each ear 4 times a day for 5 days.  I have asked him to      leave his hearing aids out for at least 2 days to allow for      adequate healing.  6. Psychiatric.  The patient is stable on Effexor and lorazepam.  7. Health maintenance.  Last colonoscopy was 2004 with the patient due      for followup in 2009.  Last cardiac stress testing from 2004 with      no need for followup studies, unless the patient is symptomatic.   SUMMARY:  He is a pleasant gentleman with problems as outlined above.  He does seem overall medically stable.     Rosalyn Gess Norins, MD  Electronically Signed    MEN/MedQ  DD: 05/15/2006  DT: 05/15/2006  Job #: [id number]   cc:   Gurney Maxin

## 2010-05-23 NOTE — Assessment & Plan Note (Signed)
[facility]                             PRIMARY CARE OFFICE NOTE   Jeff, Hunt                    MRN:          [id number]  DATE:07/29/2005                            DOB:          10/28/1925    Jeff Hunt is a 77 year old, Caucasian gentleman known to me followed for  known CAD, history of TIA and BPH.  He was last seen in the office May 01, 2005.  Please see that complete dictation.   The patient called at approximately 4 p.m. or 4:15 p.m. today to report that  he had been having episodes of chest pain or discomfort and also flushing  and heavy sweating with minimal exertion.  He reported the chest pain was  not related to his exertion.  The patient was asked to report to the office  immediately to evaluate him for acute cardiac disease.   The patient did present to the office and complaint was verified.  The  patient does get relief with antacids, by his report.   Chart was reviewed with last Cardiolite study in 2004, and was negative.  Last cardiology evaluation was in May 2005, and the patient was thought to  be cardiac stable with no active issues.   CURRENT MEDICATIONS:  1.  Simvastatin 80 mg daily.  2.  Plavix 75 mg daily.  3.  Aspirin 81 mg daily.  4.  Multivitamin.  5.  Effexor XR 150 mg daily.  6.  Lorazepam 1 mg p.r.n.  7.  Astelin.  8.  Viagra.  9.  Alprazolam.  10. Diazepam.  11. Ambien p.r.n.   REVIEW OF SYSTEMS:  CONSTITUTIONAL:  The patient has had no fevers, sweats  or chills.  HEENT:  No ENT or dental problems.  RESPIRATORY:  No complaints.   PHYSICAL EXAMINATION:  VITAL SIGNS:  Temperature 99.2, blood pressure  137/75, pulse 87, weight 223.  GENERAL:  A heavy-set, Caucasian male in no acute distress.  CHEST:  Clear with no rales, wheezes or rhonchi.  CARDIAC:  2+ radial pulses.  Regular rate and rhythm.  Split S1.  No other  murmurs were noted.  ABDOMEN:  Positive bowel sounds.  Abdomen nontender  to deep palpation.   A 12-lead electrocardiogram revealed the patient to have a sinus rhythm with  premature atrial complexes, right bundle branch block with SR prime, most  notable in V1.  No sign of old injury or acute injury.  Comparison to old  EKG showed no change.   ASSESSMENT/PLAN:  1.  Cardiovascular.  The patient with no acute EKG changes, longstanding,      right bundle branch block and a negative Cardiolite study in the past.      Discomfort his atypical.  The patient has not taken any nitroglycerin      for this discomfort.  No acute cardiac disease.  Plan is for followup      with Dr. Myrtis Ser to be scheduled.  The patient to take sublingual      nitroglycerin on a p.r.n. basis and seek immediate attention for      recurrent  chest pain or discomfort.  2.  Gastrointestinal.  The patient has what sounds like significant      dyspepsia.  It is relieved with liquid antacids.  I have recommended      using either antacids, H2 blockers or proton pump inhibitor.  3.  Sweats.  The patient with exertional sweats disproportionate to      exertion.  The patient does not appear acutely ill and stays dizzy.  I      asked him to schedule a follow-up appointment for more thorough      evaluation for this latter problem.                                   Rosalyn Gess Norins, MD   MEN/MedQ  DD:  07/29/2005  DT:  07/30/2005  Job #:  [id number]

## 2010-06-09 ENCOUNTER — Other Ambulatory Visit: Payer: Self-pay | Admitting: Internal Medicine

## 2010-06-11 ENCOUNTER — Other Ambulatory Visit: Payer: Self-pay | Admitting: Internal Medicine

## 2010-07-10 ENCOUNTER — Other Ambulatory Visit: Payer: Self-pay | Admitting: Internal Medicine

## 2010-08-20 ENCOUNTER — Other Ambulatory Visit: Payer: Self-pay | Admitting: Internal Medicine

## 2010-09-07 ENCOUNTER — Other Ambulatory Visit: Payer: Self-pay | Admitting: Internal Medicine

## 2010-09-10 ENCOUNTER — Other Ambulatory Visit: Payer: Self-pay | Admitting: Internal Medicine

## 2010-10-02 LAB — DIFFERENTIAL
Basophils Absolute: 0
Monocytes Absolute: 0.7
Neutro Abs: 4.4
Neutrophils Relative %: 69

## 2010-10-02 LAB — CBC
Hemoglobin: 13.2
MCV: 91.5
RBC: 4.26
WBC: 6.5

## 2010-10-04 ENCOUNTER — Other Ambulatory Visit: Payer: Self-pay | Admitting: Internal Medicine

## 2010-10-10 ENCOUNTER — Other Ambulatory Visit: Payer: Self-pay | Admitting: Internal Medicine

## 2010-10-22 ENCOUNTER — Other Ambulatory Visit: Payer: Self-pay | Admitting: Internal Medicine

## 2010-11-04 ENCOUNTER — Other Ambulatory Visit: Payer: Self-pay | Admitting: Internal Medicine

## 2010-11-05 NOTE — Telephone Encounter (Signed)
Please Advise refills 

## 2010-11-07 NOTE — Telephone Encounter (Signed)
Ok for refill x 11. Due for ov in december

## 2010-11-10 MED ORDER — MELOXICAM 15 MG PO TABS: 15.0000 mg | ORAL_TABLET | Freq: Every day | ORAL | Status: DC

## 2010-11-10 NOTE — Telephone Encounter (Signed)
Ok x 11 

## 2010-11-22 ENCOUNTER — Other Ambulatory Visit: Payer: Self-pay | Admitting: Internal Medicine

## 2010-12-09 ENCOUNTER — Other Ambulatory Visit: Payer: Self-pay | Admitting: Internal Medicine

## 2010-12-22 ENCOUNTER — Other Ambulatory Visit: Payer: Self-pay | Admitting: Internal Medicine

## 2011-01-06 DIAGNOSIS — Z862 Personal history of diseases of the blood and blood-forming organs and certain disorders involving the immune mechanism: Secondary | ICD-10-CM

## 2011-01-06 HISTORY — DX: Personal history of diseases of the blood and blood-forming organs and certain disorders involving the immune mechanism: Z86.2

## 2011-01-23 ENCOUNTER — Telehealth: Payer: Self-pay | Admitting: *Deleted

## 2011-01-23 MED ORDER — FUROSEMIDE 20 MG PO TABS: 20.0000 mg | ORAL_TABLET | Freq: Two times a day (BID) | ORAL | Status: DC

## 2011-01-23 NOTE — Telephone Encounter (Signed)
Refill request  furosemide 20 mg

## 2011-02-03 ENCOUNTER — Other Ambulatory Visit (INDEPENDENT_AMBULATORY_CARE_PROVIDER_SITE_OTHER): Payer: Medicare Other

## 2011-02-03 ENCOUNTER — Encounter: Payer: Self-pay | Admitting: Internal Medicine

## 2011-02-03 ENCOUNTER — Ambulatory Visit (INDEPENDENT_AMBULATORY_CARE_PROVIDER_SITE_OTHER): Payer: Medicare Other | Admitting: Internal Medicine

## 2011-02-03 DIAGNOSIS — R413 Other amnesia: Secondary | ICD-10-CM

## 2011-02-03 DIAGNOSIS — L989 Disorder of the skin and subcutaneous tissue, unspecified: Secondary | ICD-10-CM

## 2011-02-03 DIAGNOSIS — D509 Iron deficiency anemia, unspecified: Secondary | ICD-10-CM | POA: Insufficient documentation

## 2011-02-03 LAB — CBC WITH DIFFERENTIAL/PLATELET
Lymphs Abs: 1.1 10*3/uL (ref 0.7–4.0)
MCV: 77.3 fl — ABNORMAL LOW (ref 78.0–100.0)
Neutro Abs: 3.4 10*3/uL (ref 1.4–7.7)
Platelets: 222 10*3/uL (ref 150.0–400.0)
RBC: 4.37 Mil/uL (ref 4.22–5.81)

## 2011-02-03 LAB — TSH: TSH: 1.59 u[IU]/mL (ref 0.35–5.50)

## 2011-02-03 LAB — IBC PANEL: Iron: 31 ug/dL — ABNORMAL LOW (ref 42–165)

## 2011-02-03 NOTE — Progress Notes (Signed)
Subjective:    Patient ID: Jeff Hunt, male    DOB: 1925-05-18, 78 y.o.   MRN: [id number]  HPI Mr. Belling presents for follow-up. In the interval since he was last seen he has been followed by Dr. Orson Aloe, dermatologist [place]. His biopsy was read out as florid urticarial reaction - no infection or fungus. [facility] lab reveals a Hgb 10.7, MCV 73. CMet was normal with glucose of 99, Cr. 1.04, LFTsa normal. He was told that he may have a thyroid condition.   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 Marital Status: Single    Spouse Name: N/A    Number of Children: N/A  . Years of Education: N/A   Occupational History  . Not on file.   Social History Main Topics  . Smoking status: Former Smoker    Types: Pipe  . Smokeless tobacco: Never Used   Comment: Quit > 23 yrs ago.  . Alcohol Use: 10.5 oz/week    21 drink(s) per week  . Drug Use: No  . Sexually  Active: No   Other Topics Concern  . 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 Married 06-09-47- 29- divorced, married 1978-06-09- 9 years-divorce, married June 09, 1990-  10-divorce. Had a companion who died.1 son - May 09, 2056 dtrs - Jun 09, 2051, 2058/06/09, '62, '64; 6 grandchildren. 7 great-grands. 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 ACP - he thinks he has a living will. For now he wishes to have CPR, short-term mechanical ventilation  POA dtr - Mahalia Longest  (c) [phone]       Review of Systems System review is negative for any constitutional, cardiac, pulmonary, GI or neuro symptoms or complaints other than as described in the HPI.     Objective:   Physical Exam Filed Vitals:   02/03/11 0959  BP: 130/72  Pulse: 88  Temp:  97.8 F (36.6 C)  Resp: 18   Gen'l- heavy set white man in no distress and looking younger than his stated age. HEENT - C&S clear Pulm - normal respirations Cor - 2+ radial pulse, RRR Neuro - A&O x 3, memory not tested, normal gait Derm- several visible excoriations on face and neck.  Lab Results  Component Value Date   WBC 5.6 02/03/2011   HGB 10.8* 02/03/2011   HCT 33.8* 02/03/2011   PLT 222.0 02/03/2011   GLUCOSE 104* 03/19/2008   CHOL 134 03/19/2008   TRIG 146 03/19/2008   HDL 33.9* 03/19/2008   LDLCALC 71 03/19/2008   ALT 21 03/19/2008   AST 25 03/19/2008   NA 143 03/19/2008   K 4.5 03/19/2008   CL 108 03/19/2008   CREATININE 0.8 03/19/2008   BUN 28* 03/19/2008   CO2 31 03/19/2008   TSH 1.59 02/03/2011   PSA 0.37 03/19/2008          Assessment & Plan:

## 2011-02-05 NOTE — Assessment & Plan Note (Signed)
Patient with iron deficiency anemia confirmed: Fe % sat 5.9%, total iron 31, B12 326.  Plan - candidate for colorectal screening - will refer to GI.           Start iron replacement - 325 mg bid

## 2011-02-05 NOTE — Assessment & Plan Note (Signed)
Neurogenic lesions per Dr. Orson Aloe, derm in [place].

## 2011-02-06 ENCOUNTER — Encounter: Payer: Self-pay | Admitting: Internal Medicine

## 2011-02-06 DIAGNOSIS — D509 Iron deficiency anemia, unspecified: Secondary | ICD-10-CM

## 2011-02-11 ENCOUNTER — Ambulatory Visit: Payer: Medicare Other | Admitting: Family Medicine

## 2011-02-12 ENCOUNTER — Ambulatory Visit: Payer: Medicare Other | Admitting: Family Medicine

## 2011-02-13 ENCOUNTER — Encounter: Payer: Self-pay | Admitting: Gastroenterology

## 2011-02-13 ENCOUNTER — Ambulatory Visit (INDEPENDENT_AMBULATORY_CARE_PROVIDER_SITE_OTHER): Payer: Medicare Other | Admitting: Family Medicine

## 2011-02-13 ENCOUNTER — Encounter: Payer: Self-pay | Admitting: Family Medicine

## 2011-02-13 VITALS — BP 146/82 | HR 44 | Temp 98.4°F | Ht 72.0 in | Wt 270.0 lb

## 2011-02-13 DIAGNOSIS — D509 Iron deficiency anemia, unspecified: Secondary | ICD-10-CM

## 2011-02-13 DIAGNOSIS — R609 Edema, unspecified: Secondary | ICD-10-CM

## 2011-02-13 DIAGNOSIS — I499 Cardiac arrhythmia, unspecified: Secondary | ICD-10-CM

## 2011-02-13 LAB — COMPREHENSIVE METABOLIC PANEL WITH GFR
ALT: 14 U/L (ref 0–53)
AST: 27 U/L (ref 0–37)
Albumin: 4 g/dL (ref 3.5–5.2)
Alkaline Phosphatase: 86 U/L (ref 39–117)
BUN: 23 mg/dL (ref 6–23)
CO2: 28 meq/L (ref 19–32)
Calcium: 9.1 mg/dL (ref 8.4–10.5)
Chloride: 105 meq/L (ref 96–112)
Creat: 1.14 mg/dL (ref 0.50–1.35)
Glucose, Bld: 85 mg/dL (ref 70–99)
Potassium: 4.4 meq/L (ref 3.5–5.3)
Sodium: 142 meq/L (ref 135–145)
Total Bilirubin: 0.3 mg/dL (ref 0.3–1.2)
Total Protein: 6.7 g/dL (ref 6.0–8.3)

## 2011-02-13 MED ORDER — METOPROLOL SUCCINATE ER 25 MG PO TB24: 25.0000 mg | ORAL_TABLET | Freq: Every day | ORAL | Status: DC

## 2011-02-13 NOTE — Progress Notes (Signed)
OFFICE VISIT  02/18/2011   CC:  Chief Complaint  Patient presents with  . Establish Care     HPI:    Patient is a 78 y.o. Caucasian male who presents to establish care with me.  He last saw Dr. Debby Bud at [facility] on [address].  He sites some communication issues with [address] office as the reason for transferring care.  He wanted to meet me today, denies any acute issues.  He does say he has been out of metoprolol for "a while" and asks if he needs to RF it and continue it.   He has been found to have iron deficiency anemia recently and GI referral has been made.  He has had a colonoscopy but doesn't really recall when it was or who did it, although he thinks it was on [address] in [place].   Says his stool is light brown in color and he has only rare episodes of BRBPR associated with external hemorrhoids.  He is on dual platelet therapy for his hx of CVD and CAD. He takes lasix daily for peripheral edema/venous insufficiency.  It looks like it has been a while since electrolytes have been monitored.     Past Medical History  Diagnosis Date  . Memory loss   . Hypertrophy of prostate without urinary obstruction and other lower urinary tract symptoms (LUTS)   . Arthropathy, unspecified, site unspecified   . Cerebrovascular disease, unspecified   . Other and unspecified hyperlipidemia   . Depressive disorder, not elsewhere classified   . Personal history of unspecified circulatory disease   . Coronary atherosclerosis of unspecified type of vessel, native or graft     Past Surgical History  Procedure Date  . Appendectomy   . Coronary angioplasty with stent placement   . Tonsillectomy   . Cataract extraction 10/24/2007    right  . Cataract extraction 11/07/2007    left    Outpatient Prescriptions Prior to Visit  Medication Sig Dispense Refill  . aspirin 81 MG tablet Take 160 mg by mouth daily.      . clopidogrel (PLAVIX) 75 MG tablet TAKE 1 TABLET BY MOUTH EVERY DAY  90  tablet  0  . furosemide (LASIX) 20 MG tablet Take 1 tablet (20 mg total) by mouth 2 (two) times daily.  60 tablet  0  . levocetirizine (XYZAL) 5 MG tablet Take 5 mg by mouth every evening.      . meloxicam (MOBIC) 15 MG tablet Take 1 tablet (15 mg total) by mouth daily.  30 tablet  11  . psyllium (METAMUCIL) 58.6 % packet Take 1 packet by mouth as needed.      . Salicylic Acid 3 % CREA Apply topically daily.      . simvastatin (ZOCOR) 80 MG tablet TAKE 1 TABLET BY MOUTH EVERY DAY  90 tablet  0  . traMADol (ULTRAM) 50 MG tablet Take 50 mg by mouth as needed.      . venlafaxine (EFFEXOR) 75 MG tablet Take 75 mg by mouth every morning.      . metoprolol succinate (TOPROL-XL) 25 MG 24 hr tablet TAKE 1 TABLET BY MOUTH EVERY DAY FOR BLOOD PRESSURE  30 tablet  0    No Known Allergies  ROS As per HPI  PE: Blood pressure 146/82, pulse 44, temperature 98.4 F (36.9 C), temperature source Temporal, height 6' (1.829 m), weight 270 lb (122.471 kg), SpO2 96.00%. Gen: Alert, well appearing.  Patient is oriented to person,  place, time, and situation. CV: irregular rhythm, question of ectopy interrupting sinus rhythm vs atrial fibrillation. Chest with symmetric expansion, good aeration, nonlabored respirations.  Clear and equal breath sounds in all lung fields.  No clubbing or cyanosis.   LABS:  12 lead EKG today showed NSR, rate 85, PACs, RBBB.  No old EKG for comparison.  IMPRESSION AND PLAN:  Transfer pt  Anemia, iron deficiency GI referral in process.   Start ferrous sulfate 325mg  bid.  Irregular cardiac rhythm EKG today reassuring: NSR with occasional PACs. Restart metoprolol, check electrolytes/cr today.       FOLLOW UP: Return in about 6 months (around 08/13/2011) for f/u periph edema, HTN, iron defi.

## 2011-02-16 ENCOUNTER — Encounter: Payer: Self-pay | Admitting: *Deleted

## 2011-02-17 ENCOUNTER — Ambulatory Visit: Payer: Medicare Other | Admitting: Gastroenterology

## 2011-02-18 ENCOUNTER — Encounter: Payer: Self-pay | Admitting: Family Medicine

## 2011-02-18 DIAGNOSIS — R609 Edema, unspecified: Secondary | ICD-10-CM | POA: Insufficient documentation

## 2011-02-18 DIAGNOSIS — I499 Cardiac arrhythmia, unspecified: Secondary | ICD-10-CM | POA: Insufficient documentation

## 2011-02-18 NOTE — Assessment & Plan Note (Signed)
EKG today reassuring: NSR with occasional PACs. Restart metoprolol, check electrolytes/cr today.

## 2011-02-18 NOTE — Assessment & Plan Note (Signed)
GI referral in process.   Start ferrous sulfate 325mg  bid.

## 2011-03-02 ENCOUNTER — Ambulatory Visit: Payer: Self-pay | Admitting: Internal Medicine

## 2011-03-04 ENCOUNTER — Ambulatory Visit: Payer: Medicare Other | Admitting: Gastroenterology

## 2011-03-04 ENCOUNTER — Other Ambulatory Visit: Payer: Self-pay | Admitting: *Deleted

## 2011-03-04 MED ORDER — FUROSEMIDE 20 MG PO TABS: 20.0000 mg | ORAL_TABLET | Freq: Two times a day (BID) | ORAL | Status: DC

## 2011-03-04 NOTE — Telephone Encounter (Signed)
Done

## 2011-03-05 ENCOUNTER — Encounter: Payer: Self-pay | Admitting: Family Medicine

## 2011-03-05 ENCOUNTER — Ambulatory Visit (INDEPENDENT_AMBULATORY_CARE_PROVIDER_SITE_OTHER): Payer: Medicare Other | Admitting: Family Medicine

## 2011-03-05 VITALS — BP 167/79 | HR 69 | Ht 72.0 in | Wt 271.0 lb

## 2011-03-05 DIAGNOSIS — D509 Iron deficiency anemia, unspecified: Secondary | ICD-10-CM

## 2011-03-05 DIAGNOSIS — R21 Rash and other nonspecific skin eruption: Secondary | ICD-10-CM

## 2011-03-05 MED ORDER — METOLAZONE 5 MG PO TABS: ORAL_TABLET | ORAL | Status: DC

## 2011-03-05 NOTE — Progress Notes (Signed)
OFFICE NOTE  03/05/2011  CC:  Chief Complaint  Patient presents with  . medication issue     HPI: Patient is a 78 y.o. Caucasian male who is here for medication questions regarding potential skin reaction. I last saw him 3 wks ago at which time I started him on iron tabs for iron def anemia and also restarted metoprolol.  No problems with either of these meds.  He apparently has had a rash consisting of multiple distinct, scattered papular (sometimes vesicular?) lesions on upper torso and face that itch, onset somewhere around 3-4 yrs ago, has seen a few dermatologists for it, multiple topical steroids have been unhelpful, biopsy by most recently seen dermatologist in [place], Dr. Orson Aloe, showed "allergy" per pt report today.  He was put on xyzal and this has made no difference.  He denies any potential new or existing topical contacts (soaps, lotions, laundry detergents, fabrics, etc) that are potential allergens.   We reviewed his medication history to see if there were any culprits and it appears furosemide was started 08/10/07, which puts this in the vicinity of the timing of onset of rash.    Also, he has iron def anemia and has been set up to see GI for consideration of colonoscopy to r/o colon malignancy but he now says that after doing some research of his own he wants to d/c referral b/c the risk of the procedure (and potential treatments, etc, if malignancy is detected) are higher than the potential benefits for him.  He does wish to continue taking the iron supplement I rx'd recently.  Pertinent PMH:  Past Medical History  Diagnosis Date  . Memory loss   . Hypertrophy of prostate without urinary obstruction and other lower urinary tract symptoms (LUTS)   . Arthropathy, unspecified, site unspecified   . Cerebrovascular disease, unspecified   . Other and unspecified hyperlipidemia   . Depressive disorder, not elsewhere classified   . Personal history of unspecified  circulatory disease   . Coronary atherosclerosis of unspecified type of vessel, native or graft   . Hearing loss of both ears    Past surgical, social, and family history reviewed and no changes noted since last office visit.  MEDS:  Outpatient Prescriptions Prior to Visit  Medication Sig Dispense Refill  . aspirin 81 MG tablet Take 160 mg by mouth daily.      . clopidogrel (PLAVIX) 75 MG tablet TAKE 1 TABLET BY MOUTH EVERY DAY  90 tablet  0  . ferrous sulfate 325 (65 FE) MG tablet Take 325 mg by mouth 2 (two) times daily.      . furosemide (LASIX) 20 MG tablet Take 1 tablet (20 mg total) by mouth 2 (two) times daily.  60 tablet  5  . levocetirizine (XYZAL) 5 MG tablet Take 5 mg by mouth every evening.      . meloxicam (MOBIC) 15 MG tablet Take 1 tablet (15 mg total) by mouth daily.  30 tablet  11  . metoprolol succinate (TOPROL-XL) 25 MG 24 hr tablet Take 1 tablet (25 mg total) by mouth daily.  90 tablet  1  . psyllium (METAMUCIL) 58.6 % packet Take 1 packet by mouth as needed.      . Salicylic Acid 3 % CREA Apply topically daily.      . simvastatin (ZOCOR) 80 MG tablet TAKE 1 TABLET BY MOUTH EVERY DAY  90 tablet  0  . temazepam (RESTORIL) 30 MG capsule Take 30 mg by mouth at  bedtime as needed.      . traMADol (ULTRAM) 50 MG tablet Take 50 mg by mouth as needed.      . venlafaxine (EFFEXOR) 75 MG tablet Take 75 mg by mouth every morning.        PE: Blood pressure 167/79, pulse 69, height 6' (1.829 m), weight 271 lb (122.925 kg). Gen: Alert, well appearing.  Patient is oriented to person, place, time, and situation. SKIN: upper abdomen, chest, shoulders, neck, face, and upper back all with a smattering of focal erythematous papular lesions that have all been excoriated (2 mm in size approx).  No hives, no vesicles, no nodules.  His upper back mainly consists of subtly palpable hyperpigmented areas about the size of a dime or a bit smaller--scars where former lesions were per pt report.     IMPRESSION AND PLAN:  Rash This rash of multiple scattered lesions on his upper torso and face has been dx'd by a dermatologist as an atypical allergic rash per pt report. In review of chart, he started furosemide 08/10/07 and this is around the time the rash started, although pt's recollection of onset of rash is admittedly "spotty".  We decided to d/c lasix for a period of at least 10d and see if rash improves any. In the meantime if diuretic is needed for his peripheral edema I have rx'd zaroxolyn 5mg , 1/2-1 tab po qd prn. If rash is unchanged in 10 days' time then will go ahead and refer him to an allergist for further e/m.  Anemia, iron deficiency Pt reports today that he has decided to NOT go through with GI referral/colonoscopy for this.  He has decided that due to his age the risks of the procedure outweigh the potential benefit.  I reviewed potential benefits with him today to make sure he was fully informed (#1 being potentially detected a colon malignancy) and he still chooses to forego this procedure and cancel the referral to GI. He'll continue iron bid that we started within the last couple of weeks.     FOLLOW UP: 10d

## 2011-03-06 DIAGNOSIS — R21 Rash and other nonspecific skin eruption: Secondary | ICD-10-CM | POA: Insufficient documentation

## 2011-03-06 NOTE — Assessment & Plan Note (Signed)
Pt reports today that he has decided to NOT go through with GI referral/colonoscopy for this.  He has decided that due to his age the risks of the procedure outweigh the potential benefit.  I reviewed potential benefits with him today to make sure he was fully informed (#1 being potentially detected a colon malignancy) and he still chooses to forego this procedure and cancel the referral to GI. He'll continue iron bid that we started within the last couple of weeks.

## 2011-03-06 NOTE — Assessment & Plan Note (Signed)
This rash of multiple scattered lesions on his upper torso and face has been dx'd by a dermatologist as an atypical allergic rash per pt report. In review of chart, he started furosemide 08/10/07 and this is around the time the rash started, although pt's recollection of onset of rash is admittedly "spotty".  We decided to d/c lasix for a period of at least 10d and see if rash improves any. In the meantime if diuretic is needed for his peripheral edema I have rx'd zaroxolyn 5mg , 1/2-1 tab po qd prn. If rash is unchanged in 10 days' time then will go ahead and refer him to an allergist for further e/m.

## 2011-03-16 ENCOUNTER — Telehealth: Payer: Self-pay | Admitting: Family Medicine

## 2011-03-16 ENCOUNTER — Ambulatory Visit (INDEPENDENT_AMBULATORY_CARE_PROVIDER_SITE_OTHER): Payer: Medicare Other | Admitting: Family Medicine

## 2011-03-16 ENCOUNTER — Encounter: Payer: Self-pay | Admitting: Family Medicine

## 2011-03-16 DIAGNOSIS — I10 Essential (primary) hypertension: Secondary | ICD-10-CM

## 2011-03-16 DIAGNOSIS — L989 Disorder of the skin and subcutaneous tissue, unspecified: Secondary | ICD-10-CM

## 2011-03-16 DIAGNOSIS — R609 Edema, unspecified: Secondary | ICD-10-CM

## 2011-03-16 DIAGNOSIS — R21 Rash and other nonspecific skin eruption: Secondary | ICD-10-CM

## 2011-03-16 DIAGNOSIS — E785 Hyperlipidemia, unspecified: Secondary | ICD-10-CM

## 2011-03-16 NOTE — Progress Notes (Signed)
OFFICE NOTE  03/16/2011  CC:  Chief Complaint  Patient presents with  . Follow-up    rash     HPI: Patient is a 78 y.o. Caucasian male who is here for f/u itchy rash that derm eval determined to be "allergic".  We stopped his lasix last visit 10d/a to see if this med was the culprit.  His skin is pretty much unchanged except he thinks there are fewer lesions now on his cheeks/temples area since stopping lasix. He has not started his zaroxylin that I rx'd last visit and has recently started noting more LE swelling/edema.   Pertinent PMH:  Past Medical History  Diagnosis Date  . Memory loss   . Hypertrophy of prostate without urinary obstruction and other lower urinary tract symptoms (LUTS)   . Arthropathy, unspecified, site unspecified   . Cerebrovascular disease, unspecified   . Other and unspecified hyperlipidemia   . Depressive disorder, not elsewhere classified   . Personal history of unspecified circulatory disease   . Coronary atherosclerosis of unspecified type of vessel, native or graft   . Hearing loss of both ears    Past surgical, social, and family history reviewed and no changes noted since last office visit.  MEDS:  Outpatient Prescriptions Prior to Visit  Medication Sig Dispense Refill  . aspirin 81 MG tablet Take 160 mg by mouth daily.      . clopidogrel (PLAVIX) 75 MG tablet TAKE 1 TABLET BY MOUTH EVERY DAY  90 tablet  0  . ferrous sulfate 325 (65 FE) MG tablet Take 325 mg by mouth 2 (two) times daily.      Marland Kitchen levocetirizine (XYZAL) 5 MG tablet Take 5 mg by mouth every evening.      . meloxicam (MOBIC) 15 MG tablet Take 1 tablet (15 mg total) by mouth daily.  30 tablet  11  . metoprolol succinate (TOPROL-XL) 25 MG 24 hr tablet Take 1 tablet (25 mg total) by mouth daily.  90 tablet  1  . psyllium (METAMUCIL) 58.6 % packet Take 1 packet by mouth as needed.      . Salicylic Acid 3 % CREA Apply topically daily.      . simvastatin (ZOCOR) 80 MG tablet TAKE 1 TABLET  BY MOUTH EVERY DAY  90 tablet  0  . temazepam (RESTORIL) 30 MG capsule Take 30 mg by mouth at bedtime as needed.      . traMADol (ULTRAM) 50 MG tablet Take 50 mg by mouth as needed.      . venlafaxine (EFFEXOR) 75 MG tablet Take 75 mg by mouth every morning.      . metolazone (ZAROXOLYN) 5 MG tablet 1/2 -1 tab po qd prn lower extremity swelling  10 tablet  1    PE: Blood pressure 171/90, pulse 59, temperature 98 F (36.7 C), temperature source Temporal, height 6' (1.829 m), weight 273 lb (123.832 kg). Gen: Alert, well appearing.  Patient is oriented to person, place, time, and situation. SKIN: multiple scattered excoriated erythematous papular lesions on upper chest, upper arms/shoulders, neck, and face.  There are perhaps a few less lesions on his cheeks that when I saw him last.  Upper back with oval hyperpigmented macules, nonpalpable.  IMPRESSION AND PLAN: Rash Trace improvement in face but not trunk--? Of improvement as per natural course vs in response to d/c of lasix 10d ago. Need longer trial off med.  Recheck 3 wks.  If not much improved at that time, will refer to allergist.  EDEMA A bit increased off lasix, possible a bit of noncompliance with Na intake lately. Discussed low Na diet/handout and he'll start the zaroxylin I rx'd for him last visit.   HYPERLIPIDEMIA Encouraged more regular home monitoring, preferably with upper arm cuff, bring numbers to office at f/u for review, adjust/add med as necessary.  SKIN LESIONS, MULTIPLE Minimal change since d/c of lasix. Will give this a bit more time off lasix and recheck again. If not appreciably improved at that time, will refer to allergist.  Peripheral edema Venous insufficiency.  Worsening some since off lasix. Needs to start the zaroxylin I rx'd last visit.  Low Na diet discussed and handout given/reviewed.  HTN (hypertension), benign Needs to monitor home bp regularly for the next couple of weeks and we'll review  these numbers at f/u and adjust/add meds as necessary.      FOLLOW UP: 3-4 wks

## 2011-03-16 NOTE — Assessment & Plan Note (Signed)
Trace improvement in face but not trunk--? Of improvement as per natural course vs in response to d/c of lasix 10d ago. Need longer trial off med.  Recheck 3 wks.  If not much improved at that time, will refer to allergist.

## 2011-03-16 NOTE — Telephone Encounter (Signed)
[facility]. Pharmacy told patient it wasn't approved to pickup. Please call Joan Flores when Rx is ready

## 2011-03-16 NOTE — Assessment & Plan Note (Signed)
Encouraged more regular home monitoring, preferably with upper arm cuff, bring numbers to office at f/u for review, adjust/add med as necessary.

## 2011-03-16 NOTE — Assessment & Plan Note (Signed)
A bit increased off lasix, possible a bit of noncompliance with Na intake lately. Discussed low Na diet/handout and he'll start the zaroxylin I rx'd for him last visit.

## 2011-03-17 NOTE — Assessment & Plan Note (Addendum)
Venous insufficiency.  Worsening some since off lasix. Needs to start the zaroxylin I rx'd last visit.  Low Na diet discussed and handout given/reviewed.

## 2011-03-17 NOTE — Assessment & Plan Note (Signed)
Needs to monitor home bp regularly for the next couple of weeks and we'll review these numbers at f/u and adjust/add meds as necessary.

## 2011-03-17 NOTE — Assessment & Plan Note (Signed)
Minimal change since d/c of lasix. Will give this a bit more time off lasix and recheck again. If not appreciably improved at that time, will refer to allergist.

## 2011-03-18 MED ORDER — SIMVASTATIN 80 MG PO TABS: 80.0000 mg | ORAL_TABLET | Freq: Every day | ORAL | Status: DC

## 2011-03-18 NOTE — Telephone Encounter (Signed)
Simvastatin sent.

## 2011-03-24 ENCOUNTER — Encounter: Payer: Self-pay | Admitting: Family Medicine

## 2011-04-06 ENCOUNTER — Other Ambulatory Visit: Payer: Self-pay | Admitting: *Deleted

## 2011-04-06 MED ORDER — METOLAZONE 5 MG PO TABS: ORAL_TABLET | ORAL | Status: DC

## 2011-04-06 NOTE — Telephone Encounter (Signed)
Faxed refill request received from pharmacy for metolazone Last filled by MD on 03/05/11 Last seen on 03/16/11 Follow up on 04/10/11 Please advise refills.

## 2011-04-10 ENCOUNTER — Ambulatory Visit: Payer: Medicare Other | Admitting: Family Medicine

## 2011-04-10 ENCOUNTER — Ambulatory Visit (INDEPENDENT_AMBULATORY_CARE_PROVIDER_SITE_OTHER): Payer: Medicare Other | Admitting: Family Medicine

## 2011-04-10 ENCOUNTER — Encounter: Payer: Self-pay | Admitting: Family Medicine

## 2011-04-10 VITALS — BP 166/83 | HR 84 | Ht 72.0 in | Wt 259.0 lb

## 2011-04-10 DIAGNOSIS — R21 Rash and other nonspecific skin eruption: Secondary | ICD-10-CM

## 2011-04-10 DIAGNOSIS — R609 Edema, unspecified: Secondary | ICD-10-CM

## 2011-04-10 DIAGNOSIS — I10 Essential (primary) hypertension: Secondary | ICD-10-CM

## 2011-04-10 DIAGNOSIS — R600 Localized edema: Secondary | ICD-10-CM

## 2011-04-10 LAB — BASIC METABOLIC PANEL
CO2: 34 mEq/L — ABNORMAL HIGH (ref 19–32)
Calcium: 9 mg/dL (ref 8.4–10.5)
Creat: 1.65 mg/dL — ABNORMAL HIGH (ref 0.50–1.35)
Glucose, Bld: 111 mg/dL — ABNORMAL HIGH (ref 70–99)

## 2011-04-10 MED ORDER — CLOPIDOGREL BISULFATE 75 MG PO TABS: 75.0000 mg | ORAL_TABLET | Freq: Every day | ORAL | Status: DC

## 2011-04-10 MED ORDER — TRAMADOL HCL 50 MG PO TABS: ORAL_TABLET | ORAL | Status: DC

## 2011-04-10 NOTE — Assessment & Plan Note (Signed)
Office visit bp checks from Feb, and Mar x 2 have had high systolics.  He has supposed to have been monitoring this at home but has not.  He will try to do this. No changes in meds at this time.

## 2011-04-10 NOTE — Patient Instructions (Signed)
STAY OFF OF LASIX (FUROSEMIDE). Take metolazone (zaroxolyn) as your daily diuretic.

## 2011-04-10 NOTE — Assessment & Plan Note (Signed)
Unchanged.  He has not stayed off furosemide long enough to see if this med is the possible cultprit. He'll stay off lasix and I'll see him again in 53mo.   If rash unchanged at that time will refer to allergist.

## 2011-04-10 NOTE — Assessment & Plan Note (Signed)
Improved, but he has over-diuresed and we need to check a BMET today. He is back on just zaroxolyn 5mg  qd now for the last few days (no lasix), and this is what I want him to stick with for now.

## 2011-04-10 NOTE — Progress Notes (Signed)
OFFICE NOTE  04/10/2011  CC:  Chief Complaint  Patient presents with  . Follow-up    rash; looks better     HPI: Patient is a 78 y.o. Caucasian male who is here for 1 mo f/u rash, htn, and peripheral edema.  He has interpreter with him due to his hearing impairment.   He was actually only off the lasix for 3d, b/c he accidentally RF'd the lasix and took it bid with the zaroxolyn. This has resulted in 15 lb wt loss since last visit.  Taking OTC potassium and OTC iron.  He stopped the lasix bid about 3 days ago. No acute complaints.  Pertinent PMH:  Past Medical History  Diagnosis Date  . Memory loss   . Hypertrophy of prostate without urinary obstruction and other lower urinary tract symptoms (LUTS)   . Arthropathy, unspecified, site unspecified   . Cerebrovascular disease, unspecified   . Other and unspecified hyperlipidemia   . Depressive disorder, not elsewhere classified   . Personal history of unspecified circulatory disease   . Coronary atherosclerosis of unspecified type of vessel, native or graft   . Hearing loss of both ears   . Urticarial dermatitis     Skin biopsy 11/2010 favored "urticarial allergic rxn related to localized hypersensitivity such as insect bite or arthropod assault"    MEDS:  Outpatient Prescriptions Prior to Visit  Medication Sig Dispense Refill  . aspirin 81 MG tablet Take 160 mg by mouth daily.      . clopidogrel (PLAVIX) 75 MG tablet TAKE 1 TABLET BY MOUTH EVERY DAY  90 tablet  0  . ferrous sulfate 325 (65 FE) MG tablet Take 325 mg by mouth 2 (two) times daily.      Marland Kitchen levocetirizine (XYZAL) 5 MG tablet Take 5 mg by mouth every evening.      . meloxicam (MOBIC) 15 MG tablet Take 1 tablet (15 mg total) by mouth daily.  30 tablet  11  . metolazone (ZAROXOLYN) 5 MG tablet 1/2 -1 tab po qd prn lower extremity swelling  30 tablet  1  . metoprolol succinate (TOPROL-XL) 25 MG 24 hr tablet Take 1 tablet (25 mg total) by mouth daily.  90 tablet  1  .  psyllium (METAMUCIL) 58.6 % packet Take 1 packet by mouth as needed.      . simvastatin (ZOCOR) 80 MG tablet Take 1 tablet (80 mg total) by mouth at bedtime.  90 tablet  1  . temazepam (RESTORIL) 30 MG capsule Take 30 mg by mouth at bedtime as needed.      . traMADol (ULTRAM) 50 MG tablet Take 50 mg by mouth as needed.      . venlafaxine (EFFEXOR) 75 MG tablet Take 75 mg by mouth every morning.      . Salicylic Acid 3 % CREA Apply topically daily.        PE: Blood pressure 166/83, pulse 84, height 6' (1.829 m), weight 259 lb (117.482 kg). Gen: Alert, well appearing.  Patient is oriented to person, place, time, and situation. SKIN: upper chest, shoulders, upper back, and neck/face with scattered erythematous, excoriated papules.  No vesicles or pustules.  No sign of bacterial superinfection.  IMPRESSION AND PLAN:  Rash Unchanged.  He has not stayed off furosemide long enough to see if this med is the possible cultprit. He'll stay off lasix and I'll see him again in 91mo.   If rash unchanged at that time will refer to allergist.  Peripheral  edema Improved, but he has over-diuresed and we need to check a BMET today. He is back on just zaroxolyn 5mg  qd now for the last few days (no lasix), and this is what I want him to stick with for now.  HTN (hypertension), benign Office visit bp checks from Feb, and Mar x 2 have had high systolics.  He has supposed to have been monitoring this at home but has not.  He will try to do this. No changes in meds at this time.      FOLLOW UP: 1 mo

## 2011-04-10 NOTE — Progress Notes (Signed)
Addended by: Andrew Au on: 04/10/2011 11:52 AM   Modules accepted: Orders

## 2011-04-23 ENCOUNTER — Other Ambulatory Visit (INDEPENDENT_AMBULATORY_CARE_PROVIDER_SITE_OTHER): Payer: Medicare Other

## 2011-04-23 DIAGNOSIS — I10 Essential (primary) hypertension: Secondary | ICD-10-CM

## 2011-04-23 LAB — BASIC METABOLIC PANEL
Calcium: 8.9 mg/dL (ref 8.4–10.5)
Creatinine, Ser: 1.2 mg/dL (ref 0.4–1.5)

## 2011-05-08 ENCOUNTER — Encounter: Payer: Self-pay | Admitting: Family Medicine

## 2011-05-08 ENCOUNTER — Ambulatory Visit (INDEPENDENT_AMBULATORY_CARE_PROVIDER_SITE_OTHER): Payer: Medicare Other | Admitting: Family Medicine

## 2011-05-08 VITALS — BP 125/60 | HR 83 | Temp 97.5°F | Ht 72.0 in | Wt 256.4 lb

## 2011-05-08 DIAGNOSIS — G4710 Hypersomnia, unspecified: Secondary | ICD-10-CM

## 2011-05-08 DIAGNOSIS — R0683 Snoring: Secondary | ICD-10-CM | POA: Insufficient documentation

## 2011-05-08 DIAGNOSIS — R0989 Other specified symptoms and signs involving the circulatory and respiratory systems: Secondary | ICD-10-CM

## 2011-05-08 DIAGNOSIS — R0902 Hypoxemia: Secondary | ICD-10-CM

## 2011-05-08 DIAGNOSIS — T7840XA Allergy, unspecified, initial encounter: Secondary | ICD-10-CM

## 2011-05-08 DIAGNOSIS — R21 Rash and other nonspecific skin eruption: Secondary | ICD-10-CM

## 2011-05-08 DIAGNOSIS — Z888 Allergy status to other drugs, medicaments and biological substances status: Secondary | ICD-10-CM

## 2011-05-08 NOTE — Progress Notes (Signed)
OFFICE VISIT  05/08/2011   CC:  Chief Complaint  Patient presents with  . Follow-up    on rash, itchy, burns, hurts     HPI:    Patient is a 78 y.o. Caucasian male who presents for f/u rash.  He is here with a male friend as well as a person (male) to help with interpretation due to his severe hearing impairment.  Even with interpreter and friend's help, our communication was very difficult. He has remained off lasix now for 1 month and says he thinks the skin lesions have improved some.  He feels like he has less coming up in axillary regions and face, specifically.  He does, however, still have a significant number of these lesions remaining on his upper trunk and face.     He has had an intercurrent medical issue that complicates his skin issue a bit: he was rx'd amoxil and took it about 5d prior to having a dental procedure.  On day 2 of taking it he broke out in red splotchy rash, got achy all over, felt generalized weakness, intermittent blurry vision and dizziness.  No swelling of face/tongue/throat and no joint swelling.  He kept taking the med several more days before stopping it (stopped it about 2 days ago).  He does feel significantly improved since stopping the amoxil and has just a bit of his splotchy rash remaining.        It appears that he has been on meloxicam since 07/2007, when Dr. Debby Bud started him on it for arthritic pain.  I asked him if he took this med today b/c it came up on med rec as an outside med and he states that he doesn't know what the med is and he does not take it.  However, I checked with his pharmacy after his visit today and he has been picking it up every month since at least 10/2009 according to their records. This is pertinent for two reasons: it is another potential med that could be causing his current dermatitis skin lesions, plus he is taking this med combined with ASA and plavix and is at increased risk of GI hemorrhage on this combo (also, he has a  documented iron deficiency anemia that he is on supplemental iron for currently but has declined the colonoscopy we recommended.    I noted his oxygen sat in office today was 86-88% on RA when nurse checked it at check-in.  He declines any known prior hx of hypoxia.  Denies cough, SOB, wheezing, chest pain, or chest tightness.  Has very distant hx of smoking a pipe, no cigarettes.  No documented or reported hx of chronic lung disease. He denies any change in his chronic lower extremity edema, denies calf or thigh pain. No hx of DVT or PE.   He does snore, unknown whether or not he has had apneic spells in sleep.  +Excessive daytime somnolence.  Has never had a sleep study.    Past Medical History  Diagnosis Date  . Memory loss   . Hypertrophy of prostate without urinary obstruction and other lower urinary tract symptoms (LUTS)   . Arthropathy, unspecified, site unspecified   . Cerebrovascular disease, unspecified   . Other and unspecified hyperlipidemia   . Depressive disorder, not elsewhere classified   . Personal history of unspecified circulatory disease   . Coronary atherosclerosis of unspecified type of vessel, native or graft   . Hearing loss of both ears   . Urticarial dermatitis  Skin biopsy 11/2010 favored "urticarial allergic rxn related to localized hypersensitivity such as insect bite or arthropod assault"  . Venous stasis dermatitis, left 07/2007    Biopsy showed angiodermatitis, with all tests for infection NEG (fungal stain, AFB stain, and gram stain).  . Chronic venous insufficiency     Past Surgical History  Procedure Date  . Appendectomy   . Coronary angioplasty with stent placement   . Tonsillectomy   . Cataract extraction 10/24/2007    right  . Cataract extraction 11/07/2007    left    Outpatient Prescriptions Prior to Visit  Medication Sig Dispense Refill  . aspirin 81 MG tablet Take 160 mg by mouth daily.      . clopidogrel (PLAVIX) 75 MG tablet Take 1  tablet (75 mg total) by mouth daily.  90 tablet  0  . ferrous sulfate 325 (65 FE) MG tablet Take 325 mg by mouth 2 (two) times daily.      . metolazone (ZAROXOLYN) 5 MG tablet 1/2 -1 tab po qd prn lower extremity swelling  30 tablet  1  . metoprolol succinate (TOPROL-XL) 25 MG 24 hr tablet Take 1 tablet (25 mg total) by mouth daily.  90 tablet  1  . psyllium (METAMUCIL) 58.6 % packet Take 1 packet by mouth as needed.      . simvastatin (ZOCOR) 80 MG tablet Take 1 tablet (80 mg total) by mouth at bedtime.  90 tablet  1  . temazepam (RESTORIL) 30 MG capsule Take 30 mg by mouth at bedtime as needed.      . traMADol (ULTRAM) 50 MG tablet 1-2 tabs po q6h prn pain  30 tablet  3  . venlafaxine (EFFEXOR) 75 MG tablet Take 75 mg by mouth every morning.      Marland Kitchen levocetirizine (XYZAL) 5 MG tablet Take 5 mg by mouth every evening.      . salicylic acid 17 % gel Apply topically daily.        Allergies  Allergen Reactions  . Amoxicillin Rash    ROS As per HPI  PE: Blood pressure 125/60, pulse 83, temperature 97.5 F (36.4 C), temperature source Temporal, height 6' (1.829 m), weight 256 lb 6.4 oz (116.302 kg), SpO2 88.00%.  Recheck of this in exam room showed initial sat 90%, dropped to 87%, and with slow deep breathing for 10-15 seconds his sat came up to 98%.  His HR on the pulse oximeter did correlate with his palpable HR. Gen: Alert, well appearing.  Patient is oriented to person, place, time, and situation. Face without swelling, lips without swelling.  TOngue and oropharynx show normal mucosa. CV: RRR LUNGS: CTA bilat, symmetric expansion, nonlabored resps. SKIN: approx 20 erythematous papular lesions on face, chest, shoulders.  A few splotches of erythematous rash are noted scattered over his trunk--nonpalpable.  +blanchable.  No petechiae, no pustules, no desquamation.  A few of his focal papular lesions are excoriated.   Joints without erythema, warmth, swelling, or tenderness.   LABS:  None  today  IMPRESSION AND PLAN:  Rash His focal dermatitis lesions are mildly improved since being off furosemide x 64mo. We'll give this 6 more weeks and if all lesions are not resolved then will ask allergist to see him. Will need to try to verify with him again whether he is taking meloxicam or not.  If he is, I will recommend d/c this due to too much risk of GI bleed when combined with his ASA and plavix.  Additionally, this med could also potentially cause the skin lesions/allergic rash that we are currently attributing to furosemide (maybe).   Unfortunately, communication with him in office is very difficult, and communication with him on the phone is nearly impossible.  Allergic reaction caused by a drug His recent rash and systemic-type sx's on amoxil are clearly a separate issue from his chronic focal dermatitis lesions.  This is resolving appropriately since d/c of the med.  Hypoxia Unclear etiology.  He is asymptomatic.  Could simply be atelectasis related to mild chronic hypoventilation. Will check CXR and ABG as initial w/u. Additionally, will further eval for OSA with a sleep study, as people with untreated OSA could develop obesity hypoventilation syndrome with hypoxia. I feel cardiac cause or PE are much less likely and will not purse w/u for these at this time.      FOLLOW UP: Return in about 6 weeks (around 06/19/2011) for f/u rash and hypoxia.

## 2011-05-08 NOTE — Assessment & Plan Note (Signed)
His recent rash and systemic-type sx's on amoxil are clearly a separate issue from his chronic focal dermatitis lesions.  This is resolving appropriately since d/c of the med.

## 2011-05-08 NOTE — Assessment & Plan Note (Signed)
Unclear etiology.  He is asymptomatic.  Could simply be atelectasis related to mild chronic hypoventilation. Will check CXR and ABG as initial w/u. Additionally, will further eval for OSA with a sleep study, as people with untreated OSA could develop obesity hypoventilation syndrome with hypoxia. I feel cardiac cause or PE are much less likely and will not purse w/u for these at this time.

## 2011-05-08 NOTE — Assessment & Plan Note (Signed)
His focal dermatitis lesions are mildly improved since being off furosemide x 51mo. We'll give this 6 more weeks and if all lesions are not resolved then will ask allergist to see him. Will need to try to verify with him again whether he is taking meloxicam or not.  If he is, I will recommend d/c this due to too much risk of GI bleed when combined with his ASA and plavix.  Additionally, this med could also potentially cause the skin lesions/allergic rash that we are currently attributing to furosemide (maybe).   Unfortunately, communication with him in office is very difficult, and communication with him on the phone is nearly impossible.

## 2011-05-11 ENCOUNTER — Other Ambulatory Visit (HOSPITAL_COMMUNITY): Payer: Self-pay | Admitting: Respiratory Therapy

## 2011-05-11 ENCOUNTER — Ambulatory Visit (HOSPITAL_COMMUNITY)
Admission: RE | Admit: 2011-05-11 | Discharge: 2011-05-11 | Disposition: A | Discharge status: Home / Self Care | Payer: Medicare Other | Source: Ambulatory Visit | Attending: Family Medicine | Admitting: Family Medicine

## 2011-05-11 DIAGNOSIS — R0902 Hypoxemia: Secondary | ICD-10-CM | POA: Insufficient documentation

## 2011-05-11 LAB — BLOOD GAS, ARTERIAL
Bicarbonate: 30.4 mEq/L — ABNORMAL HIGH (ref 20.0–24.0)
FIO2: 0.21 %
Patient temperature: 98.6
pCO2 arterial: 41.7 mmHg (ref 35.0–45.0)
pH, Arterial: 7.475 — ABNORMAL HIGH (ref 7.350–7.450)

## 2011-05-13 ENCOUNTER — Telehealth: Payer: Self-pay | Admitting: Family Medicine

## 2011-05-13 MED ORDER — ACETAZOLAMIDE 250 MG PO TABS: ORAL_TABLET | ORAL | Status: DC

## 2011-05-13 NOTE — Telephone Encounter (Signed)
Pt is notified new diuretic has been called to pharmacy.  He knows to stop metolazone.  He will pick up new med at [facility]  Pt states he went to [facility] for xray and they stated there was no order for xray and he did not have appt.  They did not do xray.  PC to Tiffany at scheduling, she can see order.  She will call [facility] to see if there is a problem with the order.  Per Elmarie Shiley, she spoke to [facility], [facility], [facility] in Radiology and there appears to be no problem with order.  I will communicate this the patient. Pt notified to go to [facility] as a walk-in and if any problems to have them call me while he is there.  He voices understanding.

## 2011-05-13 NOTE — Telephone Encounter (Signed)
Please call pt and tell him his arterial blood gas results gave me new information to go on, and I want to change his diuretic ("water pill") again.  He currently takes zaroxolyn (metolazone is the generic name) daily, and I want him to stop taking this and start taking acetazolamide EVERY OTHER DAY.  I sent rx to his pharmacy and the directions will be clear on the bottle.  This med will hopefully help his, edema/fluid, his abnormal blood gas, PLUS it is a better diuretic to be on when we are concerned about allergy to lasix, like we are in his case.  Keep f/u with me as scheduled.  If problems or questions come up that are too much to discuss over the phone, then encourage him to come back in for o/v.  --thx

## 2011-05-22 ENCOUNTER — Telehealth: Payer: Self-pay | Admitting: Family Medicine

## 2011-05-25 ENCOUNTER — Ambulatory Visit (INDEPENDENT_AMBULATORY_CARE_PROVIDER_SITE_OTHER)
Admission: RE | Admit: 2011-05-25 | Discharge: 2011-05-25 | Disposition: A | Discharge status: Home / Self Care | Payer: Medicare Other | Source: Ambulatory Visit | Attending: Family Medicine | Admitting: Family Medicine

## 2011-05-25 DIAGNOSIS — G4710 Hypersomnia, unspecified: Secondary | ICD-10-CM

## 2011-05-25 DIAGNOSIS — R0683 Snoring: Secondary | ICD-10-CM

## 2011-05-25 DIAGNOSIS — R0989 Other specified symptoms and signs involving the circulatory and respiratory systems: Secondary | ICD-10-CM

## 2011-05-25 DIAGNOSIS — R0902 Hypoxemia: Secondary | ICD-10-CM

## 2011-05-25 NOTE — Telephone Encounter (Signed)
Noted-PM 

## 2011-05-28 NOTE — Progress Notes (Signed)
Noted-PM 

## 2011-06-02 ENCOUNTER — Encounter: Payer: Self-pay | Admitting: Family Medicine

## 2011-06-02 ENCOUNTER — Encounter (HOSPITAL_BASED_OUTPATIENT_CLINIC_OR_DEPARTMENT_OTHER): Payer: Medicare Other

## 2011-06-02 ENCOUNTER — Ambulatory Visit (INDEPENDENT_AMBULATORY_CARE_PROVIDER_SITE_OTHER): Payer: Medicare Other | Admitting: Family Medicine

## 2011-06-02 VITALS — BP 157/75 | HR 73 | Ht 72.0 in | Wt 252.0 lb

## 2011-06-02 DIAGNOSIS — E873 Alkalosis: Secondary | ICD-10-CM

## 2011-06-02 DIAGNOSIS — R21 Rash and other nonspecific skin eruption: Secondary | ICD-10-CM

## 2011-06-02 DIAGNOSIS — I10 Essential (primary) hypertension: Secondary | ICD-10-CM

## 2011-06-02 DIAGNOSIS — I872 Venous insufficiency (chronic) (peripheral): Secondary | ICD-10-CM

## 2011-06-02 DIAGNOSIS — R0902 Hypoxemia: Secondary | ICD-10-CM

## 2011-06-02 DIAGNOSIS — L989 Disorder of the skin and subcutaneous tissue, unspecified: Secondary | ICD-10-CM

## 2011-06-02 LAB — BASIC METABOLIC PANEL
CO2: 24 mEq/L (ref 19–32)
Calcium: 8.6 mg/dL (ref 8.4–10.5)
GFR: 74.76 mL/min (ref >60.00)
Potassium: 4 mEq/L (ref 3.5–5.1)
Sodium: 140 mEq/L (ref 135–145)

## 2011-06-02 NOTE — Assessment & Plan Note (Signed)
Bx has suggested allergic drug rxn. He's gradually improving off of lasix (also off of mobic).  New lesions are stopping, and the old ones are less plentiful and don't bother him. Will continue with plan for avoiding loop diuretics in the future, and if acetazolamide helps as he is currently using it we'll avoid thiazides as well.

## 2011-06-02 NOTE — Progress Notes (Signed)
OFFICE VISIT  06/02/2011   CC:  Chief Complaint  Patient presents with  . Results    discuss xray results     HPI:    Patient is a 78 y.o. Caucasian male who presents for 1 mo f/u rash, metabolic alkalosis believed to be secondary to loop/thiazide diuresis, and also for the hypoxia that resulted from respiratory compensation for his met alk. His wt is down 4 lb over the last mo, 7 lb over the last 80mo. He had a CXR last week that showed mild emphysematous changes and I have ordered some PFTs but he was checking with his insurer to see if they will cover this.  I'm also interested in getting him to do a sleep study. His skin continues to gradually improve regarding little red papules w/ excoriations.  No new lesions.  Remaining ones are on face.  Past Medical History  Diagnosis Date  . Memory loss   . Hypertrophy of prostate without urinary obstruction and other lower urinary tract symptoms (LUTS)   . Arthropathy, unspecified, site unspecified   . Cerebrovascular disease, unspecified   . Other and unspecified hyperlipidemia   . Depressive disorder, not elsewhere classified   . Personal history of unspecified circulatory disease   . Coronary atherosclerosis of unspecified type of vessel, native or graft   . Hearing loss of both ears   . Urticarial dermatitis     Skin biopsy 11/2010 favored "urticarial allergic rxn related to localized hypersensitivity such as insect bite or arthropod assault"  . Venous stasis dermatitis, left 07/2007    Biopsy showed angiodermatitis, with all tests for infection NEG (fungal stain, AFB stain, and gram stain).  . Chronic venous insufficiency     Past Surgical History  Procedure Date  . Appendectomy   . Coronary angioplasty with stent placement   . Tonsillectomy   . Cataract extraction 10/24/2007    right  . Cataract extraction 11/07/2007    left    Outpatient Prescriptions Prior to Visit  Medication Sig Dispense Refill  . acetaZOLAMIDE  (DIAMOX) 250 MG tablet 1-2 tabs every other day for lower extremity edema/swelling  30 tablet  1  . aspirin 81 MG tablet Take 160 mg by mouth daily.      . clopidogrel (PLAVIX) 75 MG tablet Take 1 tablet (75 mg total) by mouth daily.  90 tablet  0  . ferrous sulfate 325 (65 FE) MG tablet Take 325 mg by mouth 2 (two) times daily.      . metoprolol succinate (TOPROL-XL) 25 MG 24 hr tablet Take 1 tablet (25 mg total) by mouth daily.  90 tablet  1  . psyllium (METAMUCIL) 58.6 % packet Take 1 packet by mouth as needed.      . simvastatin (ZOCOR) 80 MG tablet Take 1 tablet (80 mg total) by mouth at bedtime.  90 tablet  1  . temazepam (RESTORIL) 30 MG capsule Take 30 mg by mouth at bedtime as needed.      . traMADol (ULTRAM) 50 MG tablet 1-2 tabs po q6h prn pain  30 tablet  3  . venlafaxine (EFFEXOR) 75 MG tablet Take 75 mg by mouth every morning.      Marland Kitchen levocetirizine (XYZAL) 5 MG tablet Take 5 mg by mouth every evening.      . meloxicam (MOBIC) 15 MG tablet Take 15 mg by mouth.      . salicylic acid 17 % gel Apply topically daily.  Allergies  Allergen Reactions  . Amoxicillin Rash    ROS As per HPI  PE: Blood pressure 157/75, pulse 73, height 6' (1.829 m), weight 252 lb (114.306 kg). Gen: Alert, well appearing.  Patient is oriented to person, place, time, and situation. CV: RRR, no m/r/g.   LUNGS: CTA bilat, nonlabored resps, good aeration in all lung fields. EXT: 3+ pitting edema from knees down into tops of feet bilaterally.  No stasis dermatitis changes at this time.  LABS:  None here today  IMPRESSION AND PLAN:  SKIN LESIONS, MULTIPLE Bx has suggested allergic drug rxn. He's gradually improving off of lasix (also off of mobic).  New lesions are stopping, and the old ones are less plentiful and don't bother him. Will continue with plan for avoiding loop diuretics in the future, and if acetazolamide helps as he is currently using it we'll avoid thiazides as  well.   Hypoxia Resolved currently. My Nelva Bush is that this resulted from compensatory hypoventilation response to his chonic metabolic alkalosis induced by his loop diuretics. He's been off the loop and thiazeds for about a week now.  Will check BMET. O2 sat today 96-97% on RA. Will forego plans for PFTs (cxr showed mild copd but he is essentially asymptomatic) but will go ahead and proceed with our plan for sleep study to further eval for OSA, as I think he is at high risk (+EDS, overweight).  HTN (hypertension), benign BP up a little on and off at visits.  Will make no changes today but if remains persistently up then we'll add additional bp med.     FOLLOW UP: No Follow-up on file.

## 2011-06-02 NOTE — Assessment & Plan Note (Signed)
Resolved currently. My Jeff Hunt is that this resulted from compensatory hypoventilation response to his chonic metabolic alkalosis induced by his loop diuretics. He's been off the loop and thiazeds for about a week now.  Will check BMET. O2 sat today 96-97% on RA. Will forego plans for PFTs (cxr showed mild copd but he is essentially asymptomatic) but will go ahead and proceed with our plan for sleep study to further eval for OSA, as I think he is at high risk (+EDS, overweight).

## 2011-06-02 NOTE — Assessment & Plan Note (Signed)
BP up a little on and off at visits.  Will make no changes today but if remains persistently up then we'll add additional bp med.

## 2011-06-02 NOTE — Progress Notes (Signed)
Addended by: Andrew Au on: 06/02/2011 11:36 PM   Modules accepted: Orders

## 2011-06-04 ENCOUNTER — Encounter: Payer: Self-pay | Admitting: Family Medicine

## 2011-06-04 ENCOUNTER — Telehealth: Payer: Self-pay | Admitting: Family Medicine

## 2011-06-04 NOTE — Telephone Encounter (Signed)
Pt received word from his insurer that only 20-30 % of a sleep study (in sleep lab) cost will be covered, and the remainder of the 3500$ will be out of pocket expense. He is not a candidate for home sleep study.  As a result, we will not be getting a sleep study for this pt.--PM

## 2011-06-19 ENCOUNTER — Encounter: Payer: Self-pay | Admitting: Family Medicine

## 2011-06-19 ENCOUNTER — Ambulatory Visit (INDEPENDENT_AMBULATORY_CARE_PROVIDER_SITE_OTHER): Payer: Medicare Other | Admitting: Family Medicine

## 2011-06-19 VITALS — BP 162/85 | HR 70 | Ht 72.0 in | Wt 243.0 lb

## 2011-06-19 DIAGNOSIS — N400 Enlarged prostate without lower urinary tract symptoms: Secondary | ICD-10-CM

## 2011-06-19 DIAGNOSIS — R609 Edema, unspecified: Secondary | ICD-10-CM

## 2011-06-19 DIAGNOSIS — I10 Essential (primary) hypertension: Secondary | ICD-10-CM

## 2011-06-19 DIAGNOSIS — R600 Localized edema: Secondary | ICD-10-CM

## 2011-06-19 DIAGNOSIS — I872 Venous insufficiency (chronic) (peripheral): Secondary | ICD-10-CM

## 2011-06-19 DIAGNOSIS — L989 Disorder of the skin and subcutaneous tissue, unspecified: Secondary | ICD-10-CM

## 2011-06-19 DIAGNOSIS — R21 Rash and other nonspecific skin eruption: Secondary | ICD-10-CM

## 2011-06-19 LAB — BASIC METABOLIC PANEL
BUN: 27 mg/dL — ABNORMAL HIGH (ref 6–23)
CO2: 24 mEq/L (ref 19–32)
Chloride: 111 mEq/L (ref 96–112)
Potassium: 4.6 mEq/L (ref 3.5–5.1)

## 2011-06-19 MED ORDER — TAMSULOSIN HCL 0.4 MG PO CAPS: 0.4000 mg | ORAL_CAPSULE | Freq: Every day | ORAL | Status: DC

## 2011-06-19 NOTE — Progress Notes (Signed)
OFFICE NOTE  06/19/2011  CC:  Chief Complaint  Jeff Hunt presents with  . Follow-up    rash-looks better, is using benadryl lotion; hypoxia     HPI: Jeff Hunt is a 78 y.o. Caucasian male who is here for 2 wk follow up for rash, LE edema, HTN.  He is here with his interpreter, who assisted with history-taking, etc. Rash continues to improve, he's using benadryl cream, plus he has discontinued lasix and meloxicam. LE edema stable, continues to take acetazolamide qod.   Unfortunately, he has quite a bit of nocturia which is slightly worsened by his diuretic and this is leading to poor sleep. Has a skin lesion on back of neck in hairline, says it gets caught on clothes and bothers him and he would like it off.  Pertinent PMH:  Past Medical History  Diagnosis Date  . Memory loss   . Hypertrophy of prostate without urinary obstruction and other lower urinary tract symptoms (LUTS)   . Arthropathy, unspecified, site unspecified   . Cerebrovascular disease, unspecified   . Other and unspecified hyperlipidemia   . Depressive disorder, not elsewhere classified   . Personal history of unspecified circulatory disease   . Coronary atherosclerosis of unspecified type of vessel, native or graft   . Hearing loss of both ears   . Urticarial dermatitis     Skin biopsy 11/2010 favored "urticarial allergic rxn related to localized hypersensitivity such as insect bite or arthropod assault"  . Venous stasis dermatitis, left 07/2007    Biopsy showed angiodermatitis, with all tests for infection NEG (fungal stain, AFB stain, and gram stain).  . Chronic venous insufficiency     MEDS:  Outpatient Prescriptions Prior to Visit  Medication Sig Dispense Refill  . acetaZOLAMIDE (DIAMOX) 250 MG tablet 1-2 tabs every other day for lower extremity edema/swelling  30 tablet  1  . aspirin 81 MG tablet Take 160 mg by mouth daily.      . clopidogrel (PLAVIX) 75 MG tablet Take 1 tablet (75 mg total) by mouth daily.  90  tablet  0  . ferrous sulfate 325 (65 FE) MG tablet Take 325 mg by mouth 2 (two) times daily.      . metoprolol succinate (TOPROL-XL) 25 MG 24 hr tablet Take 1 tablet (25 mg total) by mouth daily.  90 tablet  1  . Potassium Gluconate 550 MG TABS Take 1 tablet by mouth daily.      . psyllium (METAMUCIL) 58.6 % packet Take 1 packet by mouth as needed.      . simvastatin (ZOCOR) 80 MG tablet Take 1 tablet (80 mg total) by mouth at bedtime.  90 tablet  1  . temazepam (RESTORIL) 30 MG capsule Take 30 mg by mouth at bedtime as needed.      . traMADol (ULTRAM) 50 MG tablet 1-2 tabs po q6h prn pain  30 tablet  3  . venlafaxine (EFFEXOR) 75 MG tablet Take 75 mg by mouth every morning.      Marland Kitchen levocetirizine (XYZAL) 5 MG tablet Take 5 mg by mouth every evening.      . salicylic acid 17 % gel Apply topically daily.        PE: Blood pressure 162/85, pulse 70, height 6' (1.829 m), weight 243 lb (110.224 kg). Down 9 lb in the last 2 wks. Gen: Alert, well appearing.  Jeff Hunt is oriented to person, place, time, and situation.   Scalp: left occipital hairline has a flesh colored mole that is  3-4 mm size, pedunculated. CV: RRR, no m/r/g.   LUNGS: CTA bilat, nonlabored resps, good aeration in all lung fields. Ext: 2-3 + edema in ankles, without skin breakdown.   IMPRESSION AND PLAN:  Rash Allergic dermatitis per skin bx: improving gradually with cessation of lasix and mobic.  Peripheral edema Chronic venous insufficiency, stable. Continue acetazolamide qod, check BMET today.  BENIGN PROSTATIC HYPERTROPHY Start tamsuloson 0.4mg  qhs.  Therapeutic expectations and side effect profile of medication discussed today.  Jeff Hunt's questions answered.   HTN (hypertension), benign Not well controlled.  For now we'll see how tamsulosin 0.4mg  for his bph helps with his bp.  Benign skin lesion of neck Refer to skin surgery center--ordered today.     FOLLOW UP: 1 mo

## 2011-06-21 ENCOUNTER — Encounter: Payer: Self-pay | Admitting: Family Medicine

## 2011-06-21 DIAGNOSIS — L989 Disorder of the skin and subcutaneous tissue, unspecified: Secondary | ICD-10-CM | POA: Insufficient documentation

## 2011-06-21 NOTE — Assessment & Plan Note (Signed)
Allergic dermatitis per skin bx: improving gradually with cessation of lasix and mobic.

## 2011-06-21 NOTE — Assessment & Plan Note (Signed)
Chronic venous insufficiency, stable. Continue acetazolamide qod, check BMET today.

## 2011-06-21 NOTE — Assessment & Plan Note (Signed)
Start tamsuloson 0.4mg  qhs.  Therapeutic expectations and side effect profile of medication discussed today.  Patient's questions answered.

## 2011-06-21 NOTE — Assessment & Plan Note (Signed)
Refer to skin surgery center--ordered today.

## 2011-06-21 NOTE — Assessment & Plan Note (Signed)
Not well controlled.  For now we'll see how tamsulosin 0.4mg  for his bph helps with his bp.

## 2011-06-22 ENCOUNTER — Encounter: Payer: Self-pay | Admitting: Family Medicine

## 2011-07-06 ENCOUNTER — Other Ambulatory Visit: Payer: Self-pay | Admitting: *Deleted

## 2011-07-06 MED ORDER — ACETAZOLAMIDE 250 MG PO TABS: ORAL_TABLET | ORAL | Status: DC

## 2011-07-06 NOTE — Telephone Encounter (Signed)
Faxed refill request received from pharmacy for ACETAZOLAMIDE Last filled by MD on 5/8, #30 X 1 Last seen on 06/19/11 Follow up 07/17/11 RX SENT.

## 2011-07-17 ENCOUNTER — Ambulatory Visit: Payer: Medicare Other | Admitting: Family Medicine

## 2011-07-20 ENCOUNTER — Ambulatory Visit (INDEPENDENT_AMBULATORY_CARE_PROVIDER_SITE_OTHER): Payer: Medicare Other | Admitting: Family Medicine

## 2011-07-20 ENCOUNTER — Encounter: Payer: Self-pay | Admitting: Family Medicine

## 2011-07-20 VITALS — BP 144/62 | HR 70 | Ht 72.0 in | Wt 252.0 lb

## 2011-07-20 DIAGNOSIS — M719 Bursopathy, unspecified: Secondary | ICD-10-CM

## 2011-07-20 DIAGNOSIS — M7540 Impingement syndrome of unspecified shoulder: Secondary | ICD-10-CM

## 2011-07-20 DIAGNOSIS — J310 Chronic rhinitis: Secondary | ICD-10-CM

## 2011-07-20 DIAGNOSIS — N400 Enlarged prostate without lower urinary tract symptoms: Secondary | ICD-10-CM

## 2011-07-20 DIAGNOSIS — I872 Venous insufficiency (chronic) (peripheral): Secondary | ICD-10-CM

## 2011-07-20 DIAGNOSIS — M67919 Unspecified disorder of synovium and tendon, unspecified shoulder: Secondary | ICD-10-CM

## 2011-07-20 DIAGNOSIS — R609 Edema, unspecified: Secondary | ICD-10-CM

## 2011-07-20 DIAGNOSIS — M7580 Other shoulder lesions, unspecified shoulder: Secondary | ICD-10-CM

## 2011-07-20 MED ORDER — FLUTICASONE PROPIONATE 50 MCG/ACT NA SUSP: NASAL | Status: DC

## 2011-07-20 MED ORDER — ACETAZOLAMIDE 250 MG PO TABS: ORAL_TABLET | ORAL | Status: DC

## 2011-07-20 NOTE — Progress Notes (Signed)
OFFICE NOTE  07/22/2011  CC:  Chief Complaint  Patient presents with  . Follow-up    edema-better; HTN; iron def anemia     HPI: Patient is a 78 y.o. Caucasian male who is here with his interpreter Connye Burkitt for 1 month f/u for nocturia/BPH, started 0.4mg  tamsulosin qhs last visit.  Says he feels better: gets up q4 hours to urinate as opposed to q1h prior to flomax. LE edema still prominent, goes down a lot at night, then builds back up pretty quick once he gets up and starts ambulating.  He feels like his skin lesions/rash is stable, maybe a few new ones on his scalp felt by pt recently?.   C/o nasal congestion chronically but it bothers him most at night b/c he must do mouth breathing during sleep.  OTC nonmedicated products not helping--asks for rx nasal spray. Says for the last several weeks he's noted left shoulder pain with raising arm and reaching out for things.  Upper arm achy some with this.  No neck pain, no arm paresthesias or weakness.  No preceding trauma known.  He feels like this is gradually worsening.  OTC "sports creams" no help.  ROS: no SOB, no CP, no cough  Pertinent PMH:  Past Medical History  Diagnosis Date  . Memory loss   . Hypertrophy of prostate without urinary obstruction and other lower urinary tract symptoms (LUTS)   . Arthropathy, unspecified, site unspecified   . Cerebrovascular disease, unspecified   . Other and unspecified hyperlipidemia   . Depressive disorder, not elsewhere classified   . Personal history of unspecified circulatory disease   . Coronary atherosclerosis of unspecified type of vessel, native or graft   . Hearing loss of both ears   . Urticarial dermatitis     Skin biopsy 11/2010 favored "urticarial allergic rxn related to localized hypersensitivity such as insect bite or arthropod assault"  . Venous stasis dermatitis, left 07/2007    Biopsy showed angiodermatitis, with all tests for infection NEG (fungal stain, AFB stain, and gram stain).   . Chronic venous insufficiency   . BPH (benign prostatic hypertrophy)     MEDS:  Outpatient Prescriptions Prior to Visit  Medication Sig Dispense Refill  . aspirin 81 MG tablet Take 160 mg by mouth daily.      . clopidogrel (PLAVIX) 75 MG tablet Take 1 tablet (75 mg total) by mouth daily.  90 tablet  0  . ferrous sulfate 325 (65 FE) MG tablet Take 325 mg by mouth 2 (two) times daily.      . metoprolol succinate (TOPROL-XL) 25 MG 24 hr tablet Take 1 tablet (25 mg total) by mouth daily.  90 tablet  1  . Potassium Gluconate 550 MG TABS Take 1 tablet by mouth daily.      . psyllium (METAMUCIL) 58.6 % packet Take 1 packet by mouth as needed.      . simvastatin (ZOCOR) 80 MG tablet Take 1 tablet (80 mg total) by mouth at bedtime.  90 tablet  1  . Tamsulosin HCl (FLOMAX) 0.4 MG CAPS Take 1 capsule (0.4 mg total) by mouth daily.  30 capsule  3  . temazepam (RESTORIL) 30 MG capsule Take 30 mg by mouth at bedtime as needed.      . traMADol (ULTRAM) 50 MG tablet 1-2 tabs po q6h prn pain  30 tablet  3  . venlafaxine (EFFEXOR) 75 MG tablet Take 75 mg by mouth every morning.      Marland Kitchen  acetaZOLAMIDE (DIAMOX) 250 MG tablet 1-2 tabs every other day for lower extremity edema/swelling  30 tablet  1    PE: Blood pressure 144/62, pulse 70, height 6' (1.829 m), weight 252 lb (114.306 kg). Gen: Alert, well appearing.  Patient is oriented to person, place, time, and situation. Scalp: no lesions CV: RRR, no m/r/g LUNGS: trace bibasilar soft insp crackles, nonlabored resps, good aeration. EXT: no clubbing or cyanosis.  From knees down into feet he has 3+ pitting edema Left shoulder: Nontender, no deformity.  He has limited abduction, pain with resisted IR and abduction of left shoulder.  Strength in LUE 5/5 prox and dist.  No shoulder instability. SKIN: just a few sparsely scattered erythematous papules that are excoriated.  IMPRESSION AND PLAN:  Peripheral edema Needs improvement. Encouraged him to continue  current measures and I recommended he increase his acetazolamide to 2 tabs alt with 1 tab qd.  Chronic rhinitis Start flonase qd.  Rotator cuff impingement syndrome I don't think he has active tendonitis. I did recommend he do PT for this and he preferred [facility] PT referral so I ordered this today.  BENIGN PROSTATIC HYPERTROPHY Lab Results  Component Value Date   PSA 0.37 03/19/2008   PSA 0.25 05/14/2006   Recent trial of 0.4 mg flomax has helped significantly.  Will hold off on increasing dose due to a bit more orthostatic sx's since starting the med.  However, he says this is tolerable and he has had no falls.  He is learning to adjust.   SKIN LESIONS/RASH: stable/much improved since d/c of lasix and meloxicam. He sees the dermatologist 08/06/11 for a skin lesion of different type on left posterior neck.  I agreed that pt has enough limitation of mobility to qualify for permanent handicap placard for his licence plate.  He'll bring form in for me to sign.   FOLLOW UP: 45mo

## 2011-07-22 DIAGNOSIS — M7540 Impingement syndrome of unspecified shoulder: Secondary | ICD-10-CM | POA: Insufficient documentation

## 2011-07-22 DIAGNOSIS — J310 Chronic rhinitis: Secondary | ICD-10-CM | POA: Insufficient documentation

## 2011-07-22 NOTE — Assessment & Plan Note (Signed)
Needs improvement. Encouraged him to continue current measures and I recommended he increase his acetazolamide to 2 tabs alt with 1 tab qd.

## 2011-07-22 NOTE — Assessment & Plan Note (Signed)
Lab Results  Component Value Date   PSA 0.37 03/19/2008   PSA 0.25 05/14/2006   Recent trial of 0.4 mg flomax has helped significantly.  Will hold off on increasing dose due to a bit more orthostatic sx's since starting the med.  However, he says this is tolerable and he has had no falls.  He is learning to adjust.

## 2011-07-22 NOTE — Assessment & Plan Note (Signed)
I don't think he has active tendonitis. I did recommend he do PT for this and he preferred [facility] PT referral so I ordered this today.

## 2011-07-22 NOTE — Assessment & Plan Note (Signed)
Start flonase qd.

## 2011-07-29 ENCOUNTER — Other Ambulatory Visit (INDEPENDENT_AMBULATORY_CARE_PROVIDER_SITE_OTHER): Payer: Medicare Other

## 2011-07-29 ENCOUNTER — Other Ambulatory Visit: Payer: Self-pay | Admitting: *Deleted

## 2011-07-29 DIAGNOSIS — I872 Venous insufficiency (chronic) (peripheral): Secondary | ICD-10-CM

## 2011-07-29 LAB — PHOSPHORUS: Phosphorus: 3 mg/dL (ref 2.3–4.6)

## 2011-07-29 LAB — BASIC METABOLIC PANEL
GFR: 70.68 mL/min (ref >60.00)
Potassium: 3.9 mEq/L (ref 3.5–5.1)
Sodium: 140 mEq/L (ref 135–145)

## 2011-07-29 LAB — MAGNESIUM: Magnesium: 2.2 mg/dL (ref 1.5–2.5)

## 2011-07-29 MED ORDER — TRAMADOL HCL 50 MG PO TABS: ORAL_TABLET | ORAL | Status: DC

## 2011-07-29 NOTE — Telephone Encounter (Signed)
Faxed refill request received from pharmacy [phone])  For TRAMADOL Last filled by MD on 04/10/11, #30 X 3 Last seen on 07/20/11  Follow up 09/21/11 Please advise refills.

## 2011-07-29 NOTE — Telephone Encounter (Signed)
RX approved by Dr. Abner Greenspan

## 2011-08-06 DIAGNOSIS — L280 Lichen simplex chronicus: Secondary | ICD-10-CM

## 2011-08-06 DIAGNOSIS — Z872 Personal history of diseases of the skin and subcutaneous tissue: Secondary | ICD-10-CM

## 2011-08-06 HISTORY — DX: Lichen simplex chronicus: L28.0

## 2011-08-06 HISTORY — DX: Personal history of diseases of the skin and subcutaneous tissue: Z87.2

## 2011-08-07 ENCOUNTER — Other Ambulatory Visit: Payer: Self-pay | Admitting: *Deleted

## 2011-08-07 MED ORDER — METOPROLOL SUCCINATE ER 25 MG PO TB24: 25.0000 mg | ORAL_TABLET | Freq: Every day | ORAL | Status: DC

## 2011-08-07 NOTE — Telephone Encounter (Signed)
Faxed refill request received from pharmacy for metoprolol  Last seen on 07/20/11 Follow up 09/21/11 RX sent.

## 2011-08-13 ENCOUNTER — Ambulatory Visit: Payer: Medicare Other | Admitting: Family Medicine

## 2011-08-17 ENCOUNTER — Encounter: Payer: Self-pay | Admitting: Family Medicine

## 2011-08-17 ENCOUNTER — Ambulatory Visit (INDEPENDENT_AMBULATORY_CARE_PROVIDER_SITE_OTHER): Payer: Medicare Other | Admitting: Family Medicine

## 2011-08-17 VITALS — BP 169/85 | HR 73 | Temp 97.2°F | Ht 71.0 in | Wt 250.0 lb

## 2011-08-17 DIAGNOSIS — E873 Alkalosis: Secondary | ICD-10-CM

## 2011-08-17 DIAGNOSIS — R42 Dizziness and giddiness: Secondary | ICD-10-CM

## 2011-08-17 LAB — BASIC METABOLIC PANEL
BUN: 15 mg/dL (ref 6–23)
GFR: 73.05 mL/min (ref >60.00)
Potassium: 4 mEq/L (ref 3.5–5.1)
Sodium: 141 mEq/L (ref 135–145)

## 2011-08-17 MED ORDER — VENLAFAXINE HCL 75 MG PO TABS: 75.0000 mg | ORAL_TABLET | ORAL | Status: DC

## 2011-08-17 MED ORDER — TEMAZEPAM 15 MG PO CAPS: ORAL_CAPSULE | ORAL | Status: DC

## 2011-08-17 NOTE — Progress Notes (Signed)
OFFICE VISIT  08/20/2011   CC:  Chief Complaint  Patient presents with  . Medication Refill    medication review - question     HPI:    Patient is a 78 y.o. Caucasian male who presents with his interpreter Connye Burkitt for dizziness.  C/o more intermittent diseaqualibrium/lightheadedness spells, brief, onset after we changed his acetazolamide dosing to 2 qd alt with 1 qd (increased his dose).  Occ has some tingling in his fingertips as well, no known trigger.  Seems to stop briefly and allow his balance to be restored and he proceeds w/out problem.  No vertigo, no new hearing deficit, no HAs, no palpitaitons, no CP, no SOB.     Past Medical History  Diagnosis Date  . Memory loss   . Hypertrophy of prostate without urinary obstruction and other lower urinary tract symptoms (LUTS)   . Arthropathy, unspecified, site unspecified   . Cerebrovascular disease, unspecified   . Other and unspecified hyperlipidemia   . Depressive disorder, not elsewhere classified   . Personal history of unspecified circulatory disease   . Coronary atherosclerosis of unspecified type of vessel, native or graft   . Hearing loss of both ears   . Urticarial dermatitis     Skin biopsy 11/2010 favored "urticarial allergic rxn related to localized hypersensitivity such as insect bite or arthropod assault"  . Venous stasis dermatitis, left 07/2007    Biopsy showed angiodermatitis, with all tests for infection NEG (fungal stain, AFB stain, and gram stain).  . Chronic venous insufficiency   . BPH (benign prostatic hypertrophy)     Past Surgical History  Procedure Date  . Appendectomy   . Coronary angioplasty with stent placement   . Tonsillectomy   . Cataract extraction 10/24/2007    right  . Cataract extraction 11/07/2007    left    Outpatient Prescriptions Prior to Visit  Medication Sig Dispense Refill  . acetaZOLAMIDE (DIAMOX) 250 MG tablet Alternate 2 tabs qd with 1 tab qd  90 tablet  1  . aspirin 81 MG  tablet Take 160 mg by mouth daily.      . clopidogrel (PLAVIX) 75 MG tablet Take 1 tablet (75 mg total) by mouth daily.  90 tablet  0  . diphenhydrAMINE (BENADRYL MAXIMUM STRENGTH) 2 % cream Apply topically 3 (three) times daily as needed.      . ferrous sulfate 325 (65 FE) MG tablet Take 325 mg by mouth 2 (two) times daily.      . fluticasone (FLONASE) 50 MCG/ACT nasal spray 2 sprays in each nostril once daily  16 g  6  . metoprolol succinate (TOPROL-XL) 25 MG 24 hr tablet Take 1 tablet (25 mg total) by mouth daily.  90 tablet  3  . Naproxen (NAPROSYN PO) Take by mouth as needed.      . Potassium Gluconate 550 MG TABS Take 1 tablet by mouth daily.      . psyllium (METAMUCIL) 58.6 % packet Take 1 packet by mouth as needed.      . simvastatin (ZOCOR) 80 MG tablet Take 1 tablet (80 mg total) by mouth at bedtime.  90 tablet  1  . Tamsulosin HCl (FLOMAX) 0.4 MG CAPS Take 1 capsule (0.4 mg total) by mouth daily.  30 capsule  3  . traMADol (ULTRAM) 50 MG tablet 1-2 tabs po q6h prn pain  30 tablet  3  . temazepam (RESTORIL) 30 MG capsule Take 30 mg by mouth at bedtime as needed.      Marland Kitchen  venlafaxine (EFFEXOR) 75 MG tablet Take 75 mg by mouth every morning.        Allergies  Allergen Reactions  . Amoxicillin Rash    ROS As per HPI  PE: Blood pressure 169/85, pulse 73, temperature 97.2 F (36.2 C), height 5\' 11"  (1.803 m), weight 250 lb (113.399 kg). Gen: Alert, well appearing.  Patient is oriented to person, place, time, and situation. ENT: Ears: EACs clear, normal epithelium.  TMs with good light reflex and landmarks bilaterally.  Eyes: no injection, icteris, swelling, or exudate.  EOMI, PERRLA. Nose: no drainage or turbinate edema/swelling.  No injection or focal lesion.  Mouth: lips without lesion/swelling.  Oral mucosa pink and moist.  Dentition intact and without obvious caries or gingival swelling.  Oropharynx without erythema, exudate, or swelling.  Neck: supple/nontender.  No LAD, mass,  or TM.  Carotid pulses 2+ bilaterally, without bruits. CV: RRR, no m/r/g.   LUNGS: CTA bilat, nonlabored resps, good aeration in all lung fields. EXT: no clubbing or cyanosis.  2+ pitting edema in both LE's from knees down.    LABS:  none  IMPRESSION AND PLAN:  Dizziness I think he may be slightly intravascularly depleted/overdiuresed. Will change acetazolamide dosing back to 2 tabs qod and will check BMET today.    FOLLOW UP: Return for pt already has appt.

## 2011-08-18 ENCOUNTER — Telehealth: Payer: Self-pay | Admitting: *Deleted

## 2011-08-18 NOTE — Telephone Encounter (Signed)
Called and the patient answered.  I advised him that his labs were normal and he repeated that back to me and I know he heard me.   Connye Burkitt was there in the background and he told her what I said.

## 2011-08-20 DIAGNOSIS — R42 Dizziness and giddiness: Secondary | ICD-10-CM | POA: Insufficient documentation

## 2011-08-20 MED ORDER — ACETAZOLAMIDE 250 MG PO TABS: ORAL_TABLET | ORAL | Status: DC

## 2011-08-20 NOTE — Assessment & Plan Note (Signed)
I think he may be slightly intravascularly depleted/overdiuresed. Will change acetazolamide dosing back to 2 tabs qod and will check BMET today.

## 2011-09-09 ENCOUNTER — Encounter: Payer: Self-pay | Admitting: Family Medicine

## 2011-09-09 ENCOUNTER — Ambulatory Visit (INDEPENDENT_AMBULATORY_CARE_PROVIDER_SITE_OTHER): Payer: Medicare Other | Admitting: Family Medicine

## 2011-09-09 ENCOUNTER — Ambulatory Visit: Payer: Medicare Other | Admitting: Family Medicine

## 2011-09-09 VITALS — BP 132/62 | HR 70 | Ht 71.0 in | Wt 253.0 lb

## 2011-09-09 DIAGNOSIS — R609 Edema, unspecified: Secondary | ICD-10-CM

## 2011-09-09 DIAGNOSIS — F329 Major depressive disorder, single episode, unspecified: Secondary | ICD-10-CM

## 2011-09-09 DIAGNOSIS — L989 Disorder of the skin and subcutaneous tissue, unspecified: Secondary | ICD-10-CM

## 2011-09-09 DIAGNOSIS — F3289 Other specified depressive episodes: Secondary | ICD-10-CM

## 2011-09-09 DIAGNOSIS — J310 Chronic rhinitis: Secondary | ICD-10-CM

## 2011-09-09 DIAGNOSIS — R42 Dizziness and giddiness: Secondary | ICD-10-CM

## 2011-09-09 NOTE — Progress Notes (Signed)
OFFICE NOTE  09/09/2011  CC:  Chief Complaint  Patient presents with  . Follow-up     HPI: Patient is a 78 y.o. Caucasian male who is here for routine (2 mo) f/u for multiple chronic medical problems.  Ally, his interpreter, is with him today assisting with history. He says his dizziness is slightly improved since last visit.  He started flonase and does says it helps. He reports occasional stabbing-like pain in medial portion of lower leg anteriorly.  Says the skin itches at these spots as well, sometimes breaks down and weeps clear fluid focally at these areas of sharp pain.  Pertinent PMH:  Past Medical History  Diagnosis Date  . Memory loss   . Hypertrophy of prostate without urinary obstruction and other lower urinary tract symptoms (LUTS)   . Arthropathy, unspecified, site unspecified   . Cerebrovascular disease, unspecified   . Other and unspecified hyperlipidemia   . Depressive disorder, not elsewhere classified   . Personal history of unspecified circulatory disease   . Coronary atherosclerosis of unspecified type of vessel, native or graft   . Hearing loss of both ears   . Urticarial dermatitis     Skin biopsy 11/2010 favored "urticarial allergic rxn related to localized hypersensitivity such as insect bite or arthropod assault"  . Venous stasis dermatitis, left 07/2007    Biopsy showed angiodermatitis, with all tests for infection NEG (fungal stain, AFB stain, and gram stain).  . Chronic venous insufficiency   . BPH (benign prostatic hypertrophy)     MEDS:  Outpatient Prescriptions Prior to Visit  Medication Sig Dispense Refill  . acetaZOLAMIDE (DIAMOX) 250 MG tablet 2 tabs po qod  90 tablet  1  . aspirin 81 MG tablet Take 160 mg by mouth daily.      . clopidogrel (PLAVIX) 75 MG tablet Take 1 tablet (75 mg total) by mouth daily.  90 tablet  0  . diphenhydrAMINE (BENADRYL MAXIMUM STRENGTH) 2 % cream Apply topically 3 (three) times daily as needed.      . ferrous  sulfate 325 (65 FE) MG tablet Take 325 mg by mouth 2 (two) times daily.      . fluticasone (FLONASE) 50 MCG/ACT nasal spray 2 sprays in each nostril once daily  16 g  6  . metoprolol succinate (TOPROL-XL) 25 MG 24 hr tablet Take 1 tablet (25 mg total) by mouth daily.  90 tablet  3  . Naproxen (NAPROSYN PO) Take by mouth as needed.      . Potassium Gluconate 550 MG TABS Take 1 tablet by mouth daily.      . psyllium (METAMUCIL) 58.6 % packet Take 1 packet by mouth as needed.      . simvastatin (ZOCOR) 80 MG tablet Take 1 tablet (80 mg total) by mouth at bedtime.  90 tablet  1  . Tamsulosin HCl (FLOMAX) 0.4 MG CAPS Take 1 capsule (0.4 mg total) by mouth daily.  30 capsule  3  . temazepam (RESTORIL) 15 MG capsule 2 caps po qhs for insomnia  60 capsule  1  . traMADol (ULTRAM) 50 MG tablet 1-2 tabs po q6h prn pain  30 tablet  3  . venlafaxine (EFFEXOR) 75 MG tablet Take 1 tablet (75 mg total) by mouth every morning.  30 tablet  1    PE: Blood pressure 132/62, pulse 70, height 5\' 11"  (1.803 m), weight 253 lb (114.76 kg). Gen: Alert, well appearing.  Patient is oriented to person, place, time, and  situation. AFFECT: pleasant, lucid thought and speech. CV: RRR LUNGS: trace bibasilar soft early insp crackles, other wise clear.  Good aeration, nonlabored resps.  IMPRESSION AND PLAN:  EDEMA Waxing and waning.  Unfortunately, he does have intermittent skin breakdown due to periods of excessive edema/skin stretching and breakdown. He is only comfortable continuing with current treatments at this time.  I encouraged the importance of being more compliant with elevation of legs, wearing his compression stockings daily, avoiding excessive Na, and walking more when he is able.  No change in diuretic regimen for now.  SKIN LESIONS, MULTIPLE Fairly quiescent last few months. Question of whether this was from lasix or meloxicam.  Chronic rhinitis Improved with daily flonase.  DEPRESSION Dr. Evelene Croon is his  psychiatrist, recently has begun weening him off of venlafaxine and getting him back on prozac 20mg  qd at the same time. Also, restoril rx'd by her for hs use.  Dizziness Stable. Likely combo of tamsulosin and acetazolamide use. He wants to continue both these meds at current doses.   Of note, no changes in his overall management were made today. He declined flu vaccine today but we'll likely give him this next f/u visit.  Spent 30 min with pt today, with >50% of this time spent in counseling and care coordination regarding the above problems.  FOLLOW UP: 1-2 mo

## 2011-09-10 ENCOUNTER — Other Ambulatory Visit: Payer: Self-pay | Admitting: *Deleted

## 2011-09-10 MED ORDER — CLOPIDOGREL BISULFATE 75 MG PO TABS: 75.0000 mg | ORAL_TABLET | Freq: Every day | ORAL | Status: DC

## 2011-09-10 NOTE — Telephone Encounter (Signed)
Faxed refill request received from pharmacy for PLAVIX Last filled by MD on 04/10/11, #90 X 0 Last seen on 09/09/11 RX SENT.

## 2011-09-13 NOTE — Assessment & Plan Note (Signed)
Dr. Evelene Croon is his psychiatrist, recently has begun weening him off of venlafaxine and getting him back on prozac 20mg  qd at the same time. Also, restoril rx'd by her for hs use.

## 2011-09-13 NOTE — Assessment & Plan Note (Signed)
Stable. Likely combo of tamsulosin and acetazolamide use. He wants to continue both these meds at current doses.

## 2011-09-13 NOTE — Assessment & Plan Note (Signed)
Fairly quiescent last few months. Question of whether this was from lasix or meloxicam.

## 2011-09-13 NOTE — Assessment & Plan Note (Signed)
Waxing and waning.  Unfortunately, he does have intermittent skin breakdown due to periods of excessive edema/skin stretching and breakdown. He is only comfortable continuing with current treatments at this time.  I encouraged the importance of being more compliant with elevation of legs, wearing his compression stockings daily, avoiding excessive Na, and walking more when he is able.  No change in diuretic regimen for now.

## 2011-09-13 NOTE — Assessment & Plan Note (Signed)
Improved with daily flonase.

## 2011-09-15 ENCOUNTER — Other Ambulatory Visit: Payer: Self-pay | Admitting: *Deleted

## 2011-09-15 MED ORDER — SIMVASTATIN 80 MG PO TABS: 80.0000 mg | ORAL_TABLET | Freq: Every day | ORAL | Status: DC

## 2011-09-15 NOTE — Telephone Encounter (Signed)
Faxed refill request received from pharmacy for Last filled by MD on 03/2011 Last seen on 09/09/11 Follow up in 1-2 months RX sent.

## 2011-09-16 ENCOUNTER — Ambulatory Visit: Payer: Medicare Other | Admitting: Family Medicine

## 2011-09-21 ENCOUNTER — Ambulatory Visit: Payer: Medicare Other | Admitting: Family Medicine

## 2011-10-08 ENCOUNTER — Encounter: Payer: Self-pay | Admitting: Family Medicine

## 2011-10-12 ENCOUNTER — Other Ambulatory Visit: Payer: Self-pay | Admitting: *Deleted

## 2011-10-12 MED ORDER — TAMSULOSIN HCL 0.4 MG PO CAPS: 0.4000 mg | ORAL_CAPSULE | Freq: Every day | ORAL | Status: DC

## 2011-10-12 NOTE — Telephone Encounter (Signed)
Faxed refill request received from pharmacy for TAMSULOSIN Last filled by MD on 06/19/11, #30 X 3 Last seen on 09/09/11 Follow up not noted. RX SENT.

## 2011-12-17 ENCOUNTER — Other Ambulatory Visit: Payer: Self-pay | Admitting: *Deleted

## 2011-12-17 MED ORDER — TRAMADOL HCL 50 MG PO TABS: 50.0000 mg | ORAL_TABLET | Freq: Four times a day (QID) | ORAL | Status: DC | PRN

## 2011-12-17 NOTE — Telephone Encounter (Signed)
Faxed refill request received from pharmacy for TRAMADOL Last filled by MD on 07/29/11, #30 X 3 Last seen on 09/09/11 Follow up 1-2 MONTHS for edema.  No appt in system. Please advise refills.

## 2012-01-01 ENCOUNTER — Ambulatory Visit (INDEPENDENT_AMBULATORY_CARE_PROVIDER_SITE_OTHER): Payer: Medicare Other | Admitting: Family Medicine

## 2012-01-01 ENCOUNTER — Encounter: Payer: Self-pay | Admitting: Family Medicine

## 2012-01-01 VITALS — BP 135/72 | HR 66 | Temp 98.4°F | Resp 16 | Wt 206.0 lb

## 2012-01-01 DIAGNOSIS — R634 Abnormal weight loss: Secondary | ICD-10-CM

## 2012-01-01 DIAGNOSIS — L03317 Cellulitis of buttock: Secondary | ICD-10-CM

## 2012-01-01 DIAGNOSIS — L0231 Cutaneous abscess of buttock: Secondary | ICD-10-CM

## 2012-01-01 DIAGNOSIS — I10 Essential (primary) hypertension: Secondary | ICD-10-CM

## 2012-01-01 MED ORDER — SULFAMETHOXAZOLE-TMP DS 800-160 MG PO TABS: ORAL_TABLET | ORAL | Status: DC

## 2012-01-01 MED ORDER — MUPIROCIN 2 % EX OINT: TOPICAL_OINTMENT | CUTANEOUS | Status: DC

## 2012-01-01 NOTE — Progress Notes (Signed)
OFFICE NOTE  01/01/2012  CC:  Chief Complaint  Patient presents with  . Back Pain  . Follow-up     HPI: Patient is a 78 y.o. Caucasian male who is here for 3 month routine chronic illness f/u plus c/o back pain. He feels great.  Changed diet--wt loss is significant and energy level is excellent. Compliant with meds but voices lots of concerns regarding insurance coverage and formulary changes starting 01/06/12.   He c/o some pain in gluteal region on the left x 3 mo, getting better.  The area started as a small pimple type lesion and enlarged and then drained and now there is a lingering "wet" spot.  He calls the area a pressure ulcer. No recent fevers or malaise.    Pertinent PMH:  Past Medical History  Diagnosis Date  . Memory loss   . Hypertrophy of prostate without urinary obstruction and other lower urinary tract symptoms (LUTS)   . Arthropathy, unspecified, site unspecified   . Cerebrovascular disease, unspecified   . Other and unspecified hyperlipidemia   . Depressive disorder, not elsewhere classified   . Personal history of unspecified circulatory disease   . Coronary atherosclerosis of unspecified type of vessel, native or graft   . Hearing loss of both ears   . Urticarial dermatitis     Skin biopsy 11/2010 favored "urticarial allergic rxn related to localized hypersensitivity such as insect bite or arthropod assault"  . Venous stasis dermatitis, left 07/2007    Biopsy showed angiodermatitis, with all tests for infection NEG (fungal stain, AFB stain, and gram stain).  . Chronic venous insufficiency   . BPH (benign prostatic hypertrophy)   . Lichen simplex chronicus 08/2011  . History of actinic keratoses 08/2011  . Osteoarthritis 06/2009    X-rays: both hips-mild  . Lumbar degenerative disc disease 06/2009    Mild osteoarthritic changes of L-spine diffusely on x-rays    MEDS:  Outpatient Prescriptions Prior to Visit  Medication Sig Dispense Refill  . acetaZOLAMIDE  (DIAMOX) 250 MG tablet 2 tabs po qod  90 tablet  1  . aspirin 81 MG tablet Take 160 mg by mouth daily.      . clopidogrel (PLAVIX) 75 MG tablet Take 1 tablet (75 mg total) by mouth daily.  90 tablet  1  . ferrous sulfate 325 (65 FE) MG tablet Take 325 mg by mouth 2 (two) times daily.      Marland Kitchen FLUoxetine (PROZAC) 20 MG capsule Take 20 mg by mouth daily.      . fluticasone (FLONASE) 50 MCG/ACT nasal spray 2 sprays in each nostril once daily  16 g  6  . metoprolol succinate (TOPROL-XL) 25 MG 24 hr tablet Take 1 tablet (25 mg total) by mouth daily.  90 tablet  3  . Naproxen (NAPROSYN PO) Take by mouth as needed.      . Potassium Gluconate 550 MG TABS Take 1 tablet by mouth daily.      . psyllium (METAMUCIL) 58.6 % packet Take 1 packet by mouth as needed.      . simvastatin (ZOCOR) 80 MG tablet Take 1 tablet (80 mg total) by mouth at bedtime.  90 tablet  1  . Tamsulosin HCl (FLOMAX) 0.4 MG CAPS Take 1 capsule (0.4 mg total) by mouth daily.  30 capsule  3  . temazepam (RESTORIL) 15 MG capsule 2 caps po qhs for insomnia  60 capsule  1  . traMADol (ULTRAM) 50 MG tablet Take 1-2 tablets (50-100  mg total) by mouth every 6 (six) hours as needed for pain.  30 tablet  3  . diphenhydrAMINE (BENADRYL MAXIMUM STRENGTH) 2 % cream Apply topically 3 (three) times daily as needed.      . venlafaxine (EFFEXOR) 75 MG tablet Take 1 tablet (75 mg total) by mouth every morning.  30 tablet  1  Last reviewed on 01/01/2012 10:53 AM by Elnora Morrison, LPN Pt not on effexor as noted above: he is on prozac 20mg  instead (per Dr. Evelene Croon)  PE: Blood pressure 135/72, pulse 66, temperature 98.4 F (36.9 C), temperature source Temporal, resp. rate 16, weight 206 lb (93.441 kg), SpO2 96.00%. Gen: Alert, well appearing.  Patient is oriented to person, place, time, and situation. GLUTEAL REGION: medial right gluteal surface with soft ball-sized area of violacious, blanchable, macular rash.  Borders ill-defined.  In the center is a  dime sized superficial ulceration-moist.  This area is not tender to palpation. I feel no subcutaneous firmness or nodularity or fullness to suggest any edema or abscess.  IMPRESSION AND PLAN:  Gluteal abscess The abscess is resolved and all that remains is a bit of surface cellulitis and a dime sized area of superficial ulceration in the center that still weeps a bit.  I'll treat this with bactroban 2% ointment tid x 10d and bactrim DS 1 tab bid x 7d.  Loss of weight This was the first thing addressed today b/c it was such a significant amount and he and his interpreter are adamant that it was done purposefully and almost entirely through prudent dietary changes.  As he began to lose wt and feel better he began to be more active as well. He has excellent energy level and seems to feel better than I've ever seen.  HTN (hypertension), benign Problem stable.  Continue current medications and diet appropriate for this condition.  We have reviewed our general long term plan for this problem and also reviewed symptoms and signs that should prompt the patient to call or return to the office.   An After Visit Summary was printed and given to the patient.  FOLLOW UP:  3 mo

## 2012-01-01 NOTE — Assessment & Plan Note (Signed)
The abscess is resolved and all that remains is a bit of surface cellulitis and a dime sized area of superficial ulceration in the center that still weeps a bit.  I'll treat this with bactroban 2% ointment tid x 10d and bactrim DS 1 tab bid x 7d.

## 2012-01-01 NOTE — Assessment & Plan Note (Signed)
This was the first thing addressed today b/c it was such a significant amount and he and his interpreter are adamant that it was done purposefully and almost entirely through prudent dietary changes.  As he began to lose wt and feel better he began to be more active as well. He has excellent energy level and seems to feel better than I've ever seen.

## 2012-01-01 NOTE — Assessment & Plan Note (Signed)
Problem stable.  Continue current medications and diet appropriate for this condition.  We have reviewed our general long term plan for this problem and also reviewed symptoms and signs that should prompt the patient to call or return to the office.  

## 2012-01-14 ENCOUNTER — Other Ambulatory Visit: Payer: Self-pay | Admitting: *Deleted

## 2012-01-14 MED ORDER — ACETAZOLAMIDE 250 MG PO TABS: ORAL_TABLET | ORAL | Status: DC

## 2012-01-14 NOTE — Telephone Encounter (Signed)
Pt has kept appropriate follow ups.  RX sent.

## 2012-01-22 ENCOUNTER — Other Ambulatory Visit: Payer: Self-pay | Admitting: *Deleted

## 2012-01-22 MED ORDER — TAMSULOSIN HCL 0.4 MG PO CAPS: 0.4000 mg | ORAL_CAPSULE | Freq: Every day | ORAL | Status: DC

## 2012-01-22 MED ORDER — CLOPIDOGREL BISULFATE 75 MG PO TABS: 75.0000 mg | ORAL_TABLET | Freq: Every day | ORAL | Status: DC

## 2012-01-22 NOTE — Telephone Encounter (Signed)
Faxed refill request received from pharmacy for TAMSULOSIN Last filled by MD on 10/12/11, #30 X 3  Faxed refill request received from pharmacy for PLAVIX Last filled by MD on 09/10/11, #90 X 1  Last seen on 01/01/12 Follow up 3 MONTHS  Please advise refills.  Thanks.

## 2012-02-01 ENCOUNTER — Encounter: Payer: Self-pay | Admitting: Family Medicine

## 2012-02-01 ENCOUNTER — Ambulatory Visit (INDEPENDENT_AMBULATORY_CARE_PROVIDER_SITE_OTHER): Payer: Medicare Other | Admitting: Family Medicine

## 2012-02-01 VITALS — BP 124/60 | HR 64 | Temp 98.2°F | Resp 16 | Ht 71.0 in | Wt 237.0 lb

## 2012-02-01 DIAGNOSIS — R21 Rash and other nonspecific skin eruption: Secondary | ICD-10-CM

## 2012-02-01 DIAGNOSIS — L0231 Cutaneous abscess of buttock: Secondary | ICD-10-CM

## 2012-02-01 DIAGNOSIS — L03317 Cellulitis of buttock: Secondary | ICD-10-CM

## 2012-02-01 NOTE — Progress Notes (Signed)
OFFICE NOTE  02/01/2012  CC:  Chief Complaint  Patient presents with  . Follow-up    gluteal abscess-did not look like it was healing, but pt states scab came off in shower this AM and it looks much better     HPI: Patient is a 79 y.o. Caucasian male who is here for question of infxn of skin/abscess in left gluteal region. Now says it is gone/healing up.  No fevers or malaise.  Made appt 1 wk ago when lesion was larger, red, hurting him some. Says his chronic allergic/inflammitory skin lesions have gradually been clearing up. Says he feels great level of energy and "disgustingly healthy".   Pertinent PMH:  Past Medical History  Diagnosis Date  . Memory loss   . Hypertrophy of prostate without urinary obstruction and other lower urinary tract symptoms (LUTS)   . Arthropathy, unspecified, site unspecified   . Cerebrovascular disease, unspecified   . Other and unspecified hyperlipidemia   . Depressive disorder, not elsewhere classified   . Personal history of unspecified circulatory disease   . Coronary atherosclerosis of unspecified type of vessel, native or graft   . Hearing loss of both ears   . Urticarial dermatitis     Skin biopsy 11/2010 favored "urticarial allergic rxn related to localized hypersensitivity such as insect bite or arthropod assault"  . Venous stasis dermatitis, left 07/2007    Biopsy showed angiodermatitis, with all tests for infection NEG (fungal stain, AFB stain, and gram stain).  . Chronic venous insufficiency   . BPH (benign prostatic hypertrophy)   . Lichen simplex chronicus 08/2011  . History of actinic keratoses 08/2011  . Osteoarthritis 06/2009    X-rays: both hips-mild  . Lumbar degenerative disc disease 06/2009    Mild osteoarthritic changes of L-spine diffusely on x-rays    MEDS:  Outpatient Prescriptions Prior to Visit  Medication Sig Dispense Refill  . acetaZOLAMIDE (DIAMOX) 250 MG tablet ALTERNATE 2 TABLETS BY MOUTH EVERY DAY WITH 1 TABLET BY  MOUTH EVERYDAY  90 tablet  1  . aspirin 81 MG tablet Take 160 mg by mouth daily.      . clopidogrel (PLAVIX) 75 MG tablet Take 1 tablet (75 mg total) by mouth daily.  90 tablet  1  . diphenhydrAMINE (BENADRYL MAXIMUM STRENGTH) 2 % cream Apply topically 3 (three) times daily as needed.      . ferrous sulfate 325 (65 FE) MG tablet Take 325 mg by mouth 2 (two) times daily.      Marland Kitchen FLUoxetine (PROZAC) 20 MG capsule Take 20 mg by mouth daily.      . fluticasone (FLONASE) 50 MCG/ACT nasal spray 2 sprays in each nostril once daily  16 g  6  . Liniments (BLUE-EMU SUPER STRENGTH) CREA Apply topically. APPLY CREAM 2 X DAY AS NEEDED      . metoprolol succinate (TOPROL-XL) 25 MG 24 hr tablet Take 1 tablet (25 mg total) by mouth daily.  90 tablet  3  . mupirocin ointment (BACTROBAN) 2 % Apply a thin film to affected area tid x 10d  15 g  0  . Naproxen (NAPROSYN PO) Take by mouth as needed.      . Potassium Gluconate 550 MG TABS Take 1 tablet by mouth daily.      . pseudoephedrine-guaifenesin (MUCINEX D) 60-600 MG per tablet Take 1 tablet by mouth every 12 (twelve) hours.      . psyllium (METAMUCIL) 58.6 % packet Take 1 packet by mouth as needed.      Marland Kitchen  simvastatin (ZOCOR) 80 MG tablet Take 1 tablet (80 mg total) by mouth at bedtime.  90 tablet  1  . sulfamethoxazole-trimethoprim (BACTRIM DS) 800-160 MG per tablet 1 tab po bid x 7d  14 tablet  0  . Tamsulosin HCl (FLOMAX) 0.4 MG CAPS Take 1 capsule (0.4 mg total) by mouth daily.  30 capsule  6  . temazepam (RESTORIL) 15 MG capsule 2 caps po qhs for insomnia  60 capsule  1  . traMADol (ULTRAM) 50 MG tablet Take 1-2 tablets (50-100 mg total) by mouth every 6 (six) hours as needed for pain.  30 tablet  3  . venlafaxine (EFFEXOR) 75 MG tablet Take 1 tablet (75 mg total) by mouth every morning.  30 tablet  1   Last reviewed on 02/01/2012 10:52 AM by [name], MD  PE: Blood pressure 124/60, pulse 64, temperature 98.2 F (36.8 C), temperature source  Temporal, resp. rate 16, height 5\' 11"  (1.803 m), weight 237 lb (107.502 kg), SpO2 97.00%. Gen: Alert, well appearing.  Patient is oriented to person, place, time, and situation. AFFECT: pleasant, lucid thought and speech. SKIN: left gluteal region with 2-3 cm blanchable violacious blotch of skin with central 1 cm scab.  No warmth, no subQ nodule/mass, no tenderness.   Face: no skin lesions. Chest and abd: 2-3 flesh colored/light pink papules-tiny, without erythema, without vesicle or pustule.  A few scattered 2-3 mm hyperpigmented macules are present at areas of previous lesions.  IMPRESSION AND PLAN:  Gluteal abscess Spontaneously drained/resolved. Discussed application of heat/warm compress at first site of any new one in the future, as well as coming in for evaluation if he wants. No meds rx'd today.  Rash Nonspecific inflammitory lesions, hx of multiple sites--?suspected rxn to his diuretic in the distant past. These are clearing up very nicely--slowly but surely.  No further/new treatments recommended today.   An After Visit Summary was printed and given to the patient.  FOLLOW UP: 36mo

## 2012-02-01 NOTE — Assessment & Plan Note (Signed)
Spontaneously drained/resolved. Discussed application of heat/warm compress at first site of any new one in the future, as well as coming in for evaluation if he wants. No meds rx'd today.

## 2012-02-01 NOTE — Assessment & Plan Note (Signed)
Nonspecific inflammitory lesions, hx of multiple sites--?suspected rxn to his diuretic in the distant past. These are clearing up very nicely--slowly but surely.  No further/new treatments recommended today.

## 2012-02-10 ENCOUNTER — Other Ambulatory Visit: Payer: Self-pay | Admitting: *Deleted

## 2012-02-10 MED ORDER — SIMVASTATIN 80 MG PO TABS: 80.0000 mg | ORAL_TABLET | Freq: Every day | ORAL | Status: DC

## 2012-02-10 NOTE — Telephone Encounter (Signed)
Faxed refill request received from pharmacy for SIMVASTATIN Last filled by MD on 09/15/11, #90 X 1 Last seen on 02/01/12 Follow up 6 MONTHS. Refill sent per [facility] refill protocol.

## 2012-02-24 ENCOUNTER — Encounter: Payer: Self-pay | Admitting: Family Medicine

## 2012-02-24 ENCOUNTER — Ambulatory Visit (INDEPENDENT_AMBULATORY_CARE_PROVIDER_SITE_OTHER): Payer: Medicare Other | Admitting: Family Medicine

## 2012-02-24 VITALS — BP 120/72 | HR 52 | Temp 98.0°F | Resp 20 | Ht 71.0 in | Wt 231.0 lb

## 2012-02-24 DIAGNOSIS — R0789 Other chest pain: Secondary | ICD-10-CM

## 2012-02-24 NOTE — Assessment & Plan Note (Signed)
In this patient with a history of CAD/stent, this story is suggestive of unstable angina. He is currently symptom-free. I recommended that he go to the ED ASAP for further evaluation and management. Patient was hesitant to do this due to inconvenience of waiting in ED in the past, but I stressed to him the importance of getting further eval now b/c his symptoms could be the harbinger of an MI. I told him I do not think he is having GER or TIAs.   He and interpreter expressed understanding of my assessment and my recommendations and at the time that they left the office their decision was to go eat dinner and then go to [facility] ED.

## 2012-02-24 NOTE — Progress Notes (Signed)
OFFICE NOTE  02/24/2012  CC:  Chief Complaint  Patient presents with  . Heartburn    daily     HPI: Patient is a 79 y.o. Caucasian male who is here with his interpreter, Connye Burkitt, for "heartburn" and "TIA".  When asked about what he means when he says heartburn, the patient describes sharp pain in left side of chest, not actually any burning pain in substernal area.  Avoidance of coffee and alcohol didn't help.  OTC H2 blockers and antacids don't help.  No palpitations.  No worse with walking (he does no exertion beyond walking).  Not really occuring in postprandial time period as expected for GER.  Often present when waking up in the morning. It is worse/more common for him to have this chest symptom when he is more anxious or irritated with something. Occurring more over the last few weeks.  Last felt a twinge of it earlier today.  A couple of weeks ago he describes a period in which he was transiently confused, weak and dizzy standing up, without focal weakness, without numbness.  He was speaking slowly but without dysarthria, without vision changes. He was feeling better after about 30 min of slowly pushing a grocery cart around the grocery store.  He felt a pressure in his chest at the time but no chest pain, no diaphoresis, no SOB.  No nausea.  No leg pains.  Edema in legs has been quite stable.  Pertinent PMH: Past Medical History  Diagnosis Date  . Memory loss   . Hypertrophy of prostate without urinary obstruction and other lower urinary tract symptoms (LUTS)   . Arthropathy, unspecified, site unspecified   . Cerebrovascular disease, unspecified   . Other and unspecified hyperlipidemia   . Depressive disorder, not elsewhere classified   . Personal history of unspecified circulatory disease   . Coronary atherosclerosis of unspecified type of vessel, native or graft     s/p stent placement "decades ago", hx of cath after this which showed patent stent per pt report.  Marland Kitchen Hearing  loss of both ears   . Urticarial dermatitis     Skin biopsy 11/2010 favored "urticarial allergic rxn related to localized hypersensitivity such as insect bite or arthropod assault"  . Venous stasis dermatitis, left 07/2007    Biopsy showed angiodermatitis, with all tests for infection NEG (fungal stain, AFB stain, and gram stain).  . Chronic venous insufficiency   . BPH (benign prostatic hypertrophy)   . Lichen simplex chronicus 08/2011  . History of actinic keratoses 08/2011  . Osteoarthritis 06/2009    X-rays: both hips-mild  . Lumbar degenerative disc disease 06/2009    Mild osteoarthritic changes of L-spine diffusely on x-rays    MEDS:  Outpatient Prescriptions Prior to Visit  Medication Sig Dispense Refill  . acetaZOLAMIDE (DIAMOX) 250 MG tablet ALTERNATE 2 TABLETS BY MOUTH EVERY DAY WITH 1 TABLET BY MOUTH EVERYDAY  90 tablet  1  . aspirin 81 MG tablet Take 160 mg by mouth daily.      . clopidogrel (PLAVIX) 75 MG tablet Take 1 tablet (75 mg total) by mouth daily.  90 tablet  1  . diphenhydrAMINE (BENADRYL MAXIMUM STRENGTH) 2 % cream Apply topically 3 (three) times daily as needed.      . ferrous sulfate 325 (65 FE) MG tablet Take 325 mg by mouth 2 (two) times daily.      Marland Kitchen FLUoxetine (PROZAC) 20 MG capsule Take 20 mg by mouth daily.      Marland Kitchen  fluticasone (FLONASE) 50 MCG/ACT nasal spray 2 sprays in each nostril once daily  16 g  6  . metoprolol succinate (TOPROL-XL) 25 MG 24 hr tablet Take 1 tablet (25 mg total) by mouth daily.  90 tablet  3  . Naproxen (NAPROSYN PO) Take by mouth as needed.      . Potassium Gluconate 550 MG TABS Take 1 tablet by mouth daily.      . pseudoephedrine-guaifenesin (MUCINEX D) 60-600 MG per tablet Take 1 tablet by mouth every 12 (twelve) hours.      . psyllium (METAMUCIL) 58.6 % packet Take 1 packet by mouth as needed.      . simvastatin (ZOCOR) 80 MG tablet Take 1 tablet (80 mg total) by mouth at bedtime.  90 tablet  1  . Tamsulosin HCl (FLOMAX) 0.4 MG CAPS  Take 1 capsule (0.4 mg total) by mouth daily.  30 capsule  6  . temazepam (RESTORIL) 15 MG capsule 2 caps po qhs for insomnia  60 capsule  1  . traMADol (ULTRAM) 50 MG tablet Take 1-2 tablets (50-100 mg total) by mouth every 6 (six) hours as needed for pain.  30 tablet  3  . Liniments (BLUE-EMU SUPER STRENGTH) CREA Apply topically. APPLY CREAM 2 X DAY AS NEEDED      . mupirocin ointment (BACTROBAN) 2 % Apply a thin film to affected area tid x 10d  15 g  0  . sulfamethoxazole-trimethoprim (BACTRIM DS) 800-160 MG per tablet 1 tab po bid x 7d  14 tablet  0  . venlafaxine (EFFEXOR) 75 MG tablet Take 1 tablet (75 mg total) by mouth every morning.  30 tablet  1   No facility-administered medications prior to visit.    PE: Blood pressure 120/72, pulse 52, temperature 98 F (36.7 C), temperature source Temporal, resp. rate 20, height 5\' 11"  (1.803 m), weight 231 lb (104.781 kg). Interpreter, Ally, in room with pt. Gen: Alert, well appearing.  Patient is oriented to person, place, time, and situation. AFFECT: pleasant, lucid thought and speech. ENT: Eyes: no injection, icteris, swelling, or exudate.  EOMI, PERRLA.  No injection or focal lesion.  Mouth: lips without lesion/swelling.  Oral mucosa pink and moist. Oropharynx without erythema, exudate, or swelling.  Neck: supple/nontender.  No LAD, mass, or TM.  Carotid pulses 2+ bilaterally, without bruits. CV: RRR (rate about 65 by me), no m/r/g.   LUNGS: CTA bilat, nonlabored resps, good aeration in all lung fields. Chest wall: no TTP ABD: soft, ND/NT. Ext: trace RLE pitting edema, 1+ LLE edema, without erythema or skin breakdown.  LAB: 12 lead EKG: sinus bradycardia, rate 51, RBBB.    IMPRESSION AND PLAN:  Atypical chest pain In this patient with a history of CAD/stent, this story is suggestive of unstable angina. He is currently symptom-free. I recommended that he go to the ED ASAP for further evaluation and management. Patient was hesitant  to do this due to inconvenience of waiting in ED in the past, but I stressed to him the importance of getting further eval now b/c his symptoms could be the harbinger of an MI. I told him I do not think he is having GER or TIAs.   He and interpreter expressed understanding of my assessment and my recommendations and at the time that they left the office their decision was to go eat dinner and then go to [facility] ED.   Spent 1 hour with pt today, with >50% of this time spent in counseling  and care coordination for the above problem.  FOLLOW UP: prn--depending on outcome of ED visit.

## 2012-02-24 NOTE — Patient Instructions (Signed)
Go to [facility] or [facility] ED for further evaluation tonight.

## 2012-02-25 ENCOUNTER — Telehealth: Payer: Self-pay | Admitting: Family Medicine

## 2012-02-25 NOTE — Telephone Encounter (Signed)
Referral has been ordered

## 2012-02-25 NOTE — Telephone Encounter (Signed)
I spoke to [place] and she states that Jeff Hunt was feeling OK last night and would not go to ER.  He is very reluctant to go if he is feeling OK.  He has told her that he will go to ER if he starts having any symptoms.  He does want referral to cardiology--[place].  Stacie Glaze we will put in request and someone will call her with appt details in a few days.  She voices understanding.

## 2012-02-25 NOTE — Telephone Encounter (Signed)
Pls call pt's interpreter and tell her that since he did not go to the ED last night I would still recommend he see a local cardiologist for further evaluation. I'll order referral if he is agreeable.  Let me know-thx

## 2012-02-26 ENCOUNTER — Encounter: Payer: Self-pay | Admitting: Family Medicine

## 2012-03-15 ENCOUNTER — Ambulatory Visit (INDEPENDENT_AMBULATORY_CARE_PROVIDER_SITE_OTHER): Payer: Medicare Other | Admitting: Cardiovascular Disease

## 2012-03-15 VITALS — BP 120/68 | HR 60 | Wt 231.0 lb

## 2012-03-15 DIAGNOSIS — E785 Hyperlipidemia, unspecified: Secondary | ICD-10-CM

## 2012-03-15 DIAGNOSIS — I10 Essential (primary) hypertension: Secondary | ICD-10-CM

## 2012-03-15 NOTE — Assessment & Plan Note (Signed)
Well controlled.  Continue current medications and low sodium Dash type diet.    

## 2012-03-15 NOTE — Progress Notes (Signed)
Patient ID: Jeff Hunt, male   DOB: 1925/01/30, 79 y.o.   MRN: [id number] 79 y.o. referred by Dr Milinda Cave for chest pain.  Patient denies any problems Says he had indigestion and it has been cured with OTC omeprazole.  NO exertional pains. Walks with cane No history of CAD  ECG done 2/18 unchanged from a year ago with RBBB and no ischemic changes.  Patient has hearing problems and interpretor present.  Has mild chronic LE edema.  Clearly indicates nothing wrong at this point in time Active cooking and planting in garden with no chest pain  ROS: Denies fever, malais, weight loss, blurry vision, decreased visual acuity, cough, sputum, SOB, hemoptysis, pleuritic pain, palpitaitons, heartburn, abdominal pain, melena, lower extremity edema, claudication, or rash.  All other systems reviewed and negative   General: Affect appropriate Healthy:  appears stated age HEENT: poor hearing Neck supple with no adenopathy JVP normal no bruits no thyromegaly Lungs clear with no wheezing and good diaphragmatic motion Heart:  S1/S2 no murmur,rub, gallop or click PMI normal Abdomen: benighn, BS positve, no tenderness, no AAA no bruit.  No HSM or HJR Distal pulses intact with no bruits Trace bilateral edema Neuro non-focal Skin warm and dry No muscular weakness  Medications Current Outpatient Prescriptions  Medication Sig Dispense Refill  . acetaZOLAMIDE (DIAMOX) 250 MG tablet ALTERNATE 2 TABLETS BY MOUTH EVERY DAY WITH 1 TABLET BY MOUTH EVERYDAY  90 tablet  1  . aspirin 81 MG tablet Take 160 mg by mouth daily.      . clopidogrel (PLAVIX) 75 MG tablet Take 1 tablet (75 mg total) by mouth daily.  90 tablet  1  . diphenhydrAMINE (BENADRYL MAXIMUM STRENGTH) 2 % cream Apply topically 3 (three) times daily as needed.      . famotidine (PEPCID) 20 MG tablet Take 20 mg by mouth as needed.       . ferrous sulfate 325 (65 FE) MG tablet Take 325 mg by mouth 2 (two) times daily.      Marland Kitchen FLUoxetine (PROZAC) 20 MG  capsule Take 20 mg by mouth daily.      . fluticasone (FLONASE) 50 MCG/ACT nasal spray 2 sprays in each nostril once daily  16 g  6  . metoprolol succinate (TOPROL-XL) 25 MG 24 hr tablet Take 1 tablet (25 mg total) by mouth daily.  90 tablet  3  . Naproxen (NAPROSYN PO) Take by mouth as needed.      Marland Kitchen omeprazole (PRILOSEC) 20 MG capsule Take 20 mg by mouth daily.      . Potassium Gluconate 550 MG TABS Take 1 tablet by mouth daily.      . pseudoephedrine-guaifenesin (MUCINEX D) 60-600 MG per tablet Take 1 tablet by mouth every 12 (twelve) hours.      . psyllium (METAMUCIL) 58.6 % packet Take 1 packet by mouth as needed.      . simvastatin (ZOCOR) 80 MG tablet Take 1 tablet (80 mg total) by mouth at bedtime.  90 tablet  1  . Tamsulosin HCl (FLOMAX) 0.4 MG CAPS Take 1 capsule (0.4 mg total) by mouth daily.  30 capsule  6  . temazepam (RESTORIL) 15 MG capsule 2 caps po qhs for insomnia  60 capsule  1  . traMADol (ULTRAM) 50 MG tablet Take 1-2 tablets (50-100 mg total) by mouth every 6 (six) hours as needed for pain.  30 tablet  3  . triamcinolone (KENALOG) 0.025 % cream Apply 2-3 times daily to  skin eruptions, hives       No current facility-administered medications for this visit.    Allergies Amoxicillin  Family History: Family History  Problem Relation Age of Onset  . Heart disease Father 39  . Hypertension Mother   . Dementia Sister   . Colon cancer Neg Hx     Social History: History   Social History  . Marital Status: Single    Spouse Name: N/A    Number of Children: N/A  . Years of Education: N/A   Occupational History  . Retired    Social History Main Topics  . Smoking status: Former Smoker    Types: Pipe  . Smokeless tobacco: Never Used     Comment: Quit > 23 yrs ago.  . Alcohol Use: 10.5 oz/week    21 drink(s) per week  . Drug Use: No  . Sexually Active: No   Other Topics Concern  . 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 [facility]. Married 2047-06-03- 29- divorced, married 06/03/78- 9 years-divorce, married 1990/06/03-  10-divorce. Had a companion who died.1 son - 2056/05/03 dtrs - 06/03/51, 06-03-58, '62, '64; 6 grandchildren. 7 great-grands. 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 ACP - he thinks he has a living will. For now he wishes to have CPR, short-term mechanical ventilation  POA dtr - [place] Longest  (c) [phone]    Electrocardiogram:  02/24/12  SR RBBB otherwise normal   Assessment and Plan

## 2012-03-15 NOTE — Assessment & Plan Note (Signed)
Cholesterol is at goal.  Continue current dose of statin and diet Rx.  No myalgias or side effects.  F/U  LFT's in 6 months. Lab Results  Component Value Date   LDLCALC 71 03/19/2008

## 2012-03-15 NOTE — Assessment & Plan Note (Signed)
Low sodium diet Elevate legs support hose

## 2012-03-15 NOTE — Assessment & Plan Note (Signed)
Resolved with prilosec Does not appear to be chest pain or anginal equivalent No ECG changes.  Patient does not want any testing at this time and I would agree to observe

## 2012-03-15 NOTE — Patient Instructions (Signed)
Your physician recommends that you schedule a follow-up appointment in: AS NEEDED  Your physician recommends that you continue on your current medications as directed. Please refer to the Current Medication list given to you today.  

## 2012-03-25 ENCOUNTER — Encounter: Payer: Self-pay | Admitting: Podiatry

## 2012-03-25 ENCOUNTER — Ambulatory Visit (INDEPENDENT_AMBULATORY_CARE_PROVIDER_SITE_OTHER): Payer: Medicare Other | Admitting: Podiatry

## 2012-03-25 DIAGNOSIS — B351 Tinea unguium: Secondary | ICD-10-CM

## 2012-03-25 DIAGNOSIS — M79609 Pain in unspecified limb: Secondary | ICD-10-CM

## 2012-03-25 DIAGNOSIS — R609 Edema, unspecified: Secondary | ICD-10-CM

## 2012-03-25 NOTE — Progress Notes (Signed)
SUBJECTIVE: 79 y.o. year old male presents for toe nails cut. He was trying to get his own nails trimmed and caused injury to skin and made it to bleed on 2nd right.  Patient has hearing difficulty and came in with an interpretor.  Patient was getting regular foot care q 3 months. Has swelling and redness on left lower leg (distal 1/2) above ankle with occasional pain , using compression socks.  History of foot pain from deformed thick toe nails and on left leg from swelling.  REVIEW OF SYSTEMS: A comprehensive review of systems was negative except for: hearing problem, pain from swollen left leg. Also has difficulty hearing.   OBJECTIVE: DERMATOLOGIC EXAMINATION: Nails: abnormal appearing nails include 1-5 bilateral. Skin Integrity is normal on both feet. There is mild case of erythema with contracted skin left lower 1/2 of leg. This gets painful at times. No skin is broken. Edema present above and below the reddened area left lower limb.  VASCULAR EXAMINATION OF LOWER LIMBS: Pedal pulses: Dorsalis pedis artery is palpable on right. Left is not palpable. Posterior Tibial artery is not palpable due to edema in akle.  Edema: positive noted in the lower limbs bilateral.  Ischemic changes are not apparent bilateral. Temperature gradient from tibial crest to dorsum of foot is warm to cool from tibial crest to dorsum of foot bilateral.  NEUROLOGIC EXAMINATION OF THE LOWER LIMBS: All epicritic sensations are grossly intact.   MUSCULOSKELETAL EXAMINATION: Positive for contracted 2nd digit bilateral. Otherwise not gross osseous deformities.   ASSESSMENT: 1. Venous stasis dermatitis left lower leg without open skin.  2. Onychomycosis x 10. 3. Edema lower limbs.  PLAN: Reviewed clinical findings and available treatment options. All toe nails debrided. Advised to continue to use compressions socks daily and inspect lower limbs daily. Palliative care q 3 months.

## 2012-03-25 NOTE — Patient Instructions (Addendum)
Seen for toe nail problems. Had broken skin on right 2nd toe trying self care.  There is excessive swelling on both lower limbs. Left foot has poor circulation.  Plan: Please continue using compression socks daily. Pay attention to left foot and careful not to get injury.  Continue with periodic nail care every 3 months.

## 2012-04-11 ENCOUNTER — Encounter: Payer: Self-pay | Admitting: Family Medicine

## 2012-04-11 ENCOUNTER — Ambulatory Visit (INDEPENDENT_AMBULATORY_CARE_PROVIDER_SITE_OTHER): Payer: Medicare Other | Admitting: Family Medicine

## 2012-04-11 VITALS — BP 118/64 | HR 56 | Ht 71.0 in | Wt 232.0 lb

## 2012-04-11 DIAGNOSIS — L259 Unspecified contact dermatitis, unspecified cause: Secondary | ICD-10-CM

## 2012-04-11 DIAGNOSIS — J309 Allergic rhinitis, unspecified: Secondary | ICD-10-CM

## 2012-04-11 DIAGNOSIS — R143 Flatulence: Secondary | ICD-10-CM

## 2012-04-11 DIAGNOSIS — R197 Diarrhea, unspecified: Secondary | ICD-10-CM

## 2012-04-11 DIAGNOSIS — L309 Dermatitis, unspecified: Secondary | ICD-10-CM

## 2012-04-11 LAB — CBC WITH DIFFERENTIAL/PLATELET
Basophils Absolute: 0 10*3/uL (ref 0.0–0.1)
Eosinophils Absolute: 0.2 10*3/uL (ref 0.0–0.7)
HCT: 38.9 % — ABNORMAL LOW (ref 39.0–52.0)
Hemoglobin: 13 g/dL (ref 13.0–17.0)
Lymphs Abs: 0.9 10*3/uL (ref 0.7–4.0)
MCHC: 33.5 g/dL (ref 30.0–36.0)
MCV: 93 fl (ref 78.0–100.0)
Neutro Abs: 3.4 10*3/uL (ref 1.4–7.7)
RDW: 13.4 % (ref 11.5–14.6)

## 2012-04-11 LAB — COMPREHENSIVE METABOLIC PANEL
ALT: 16 U/L (ref 0–53)
AST: 22 U/L (ref 0–37)
CO2: 22 mEq/L (ref 19–32)
Creatinine, Ser: 1 mg/dL (ref 0.4–1.5)
GFR: 77.25 mL/min (ref >60.00)
Total Bilirubin: 0.5 mg/dL (ref 0.3–1.2)

## 2012-04-11 NOTE — Progress Notes (Signed)
OFFICE NOTE  04/11/2012  CC:  Chief Complaint  Patient presents with  . Follow-up    seasonal allergies     HPI: Patient is a 79 y.o. Caucasian male who is here with his interpreter, Connye Burkitt, for recheck of his skin.  Wants referral to allergist for allergy testing.  Even with interpreter, getting the patient to answer a question straightforward is difficult.  In addition, pt and interpreter argue some, so history taking is undoubtedly sketchy.  About 4-6 wks of excessive flatulence and fecal urgency, happens several times per day--sometimes as often as every few hours.  No abd pain.  No new items in his diet.  Not necessarily postprandial.  He can't identify any trigger.  No fever. He has hemorrhoids, occasional BRB on toilet paper but no blood in BM.  No OTC meds have been tried. Takes metamucil a few times a day.  No recent foreign travel.  Pertinent PMH:  Past Medical History  Diagnosis Date  . Memory loss   . Hypertrophy of prostate without urinary obstruction and other lower urinary tract symptoms (LUTS)   . Arthropathy, unspecified, site unspecified   . Cerebrovascular disease, unspecified   . Other and unspecified hyperlipidemia   . Depressive disorder, not elsewhere classified   . Personal history of unspecified circulatory disease   . Coronary atherosclerosis of unspecified type of vessel, native or graft     s/p stent placement "decades ago", hx of cath after this which showed patent stent per pt report.  Marland Kitchen Hearing loss of both ears   . Urticarial dermatitis     Skin biopsy 11/2010 favored "urticarial allergic rxn related to localized hypersensitivity such as insect bite or arthropod assault"  . Venous stasis dermatitis, left 07/2007    Biopsy showed angiodermatitis, with all tests for infection NEG (fungal stain, AFB stain, and gram stain).  . Chronic venous insufficiency   . BPH (benign prostatic hypertrophy)   . Lichen simplex chronicus 08/2011  . History of actinic  keratoses 08/2011  . Osteoarthritis 06/2009    X-rays: both hips-mild  . Lumbar degenerative disc disease 06/2009    Mild osteoarthritic changes of L-spine diffusely on x-rays   Past Surgical History  Procedure Laterality Date  . Appendectomy    . Coronary angioplasty with stent placement    . Tonsillectomy    . Cataract extraction  10/24/2007    right  . Cataract extraction  11/07/2007    left    MEDS:  Outpatient Prescriptions Prior to Visit  Medication Sig Dispense Refill  . acetaZOLAMIDE (DIAMOX) 250 MG tablet ALTERNATE 2 TABLETS BY MOUTH EVERY DAY WITH 1 TABLET BY MOUTH EVERYDAY  90 tablet  1  . aspirin 81 MG tablet Take 160 mg by mouth daily.      . clopidogrel (PLAVIX) 75 MG tablet Take 1 tablet (75 mg total) by mouth daily.  90 tablet  1  . diphenhydrAMINE (BENADRYL MAXIMUM STRENGTH) 2 % cream Apply topically 3 (three) times daily as needed.      . famotidine (PEPCID) 20 MG tablet Take 20 mg by mouth as needed.       . ferrous sulfate 325 (65 FE) MG tablet Take 325 mg by mouth 2 (two) times daily.      Marland Kitchen FLUoxetine (PROZAC) 20 MG capsule Take 20 mg by mouth daily.      . fluticasone (FLONASE) 50 MCG/ACT nasal spray 2 sprays in each nostril once daily  16 g  6  .  metoprolol succinate (TOPROL-XL) 25 MG 24 hr tablet Take 1 tablet (25 mg total) by mouth daily.  90 tablet  3  . Naproxen (NAPROSYN PO) Take by mouth as needed.      Marland Kitchen omeprazole (PRILOSEC) 20 MG capsule Take 20 mg by mouth daily.      . Potassium Gluconate 550 MG TABS Take 1 tablet by mouth daily.      . pseudoephedrine-guaifenesin (MUCINEX D) 60-600 MG per tablet Take 1 tablet by mouth every 12 (twelve) hours.      . psyllium (METAMUCIL) 58.6 % packet Take 1 packet by mouth as needed.      . simvastatin (ZOCOR) 80 MG tablet Take 1 tablet (80 mg total) by mouth at bedtime.  90 tablet  1  . Tamsulosin HCl (FLOMAX) 0.4 MG CAPS Take 1 capsule (0.4 mg total) by mouth daily.  30 capsule  6  . temazepam (RESTORIL) 15 MG  capsule 2 caps po qhs for insomnia  60 capsule  1  . traMADol (ULTRAM) 50 MG tablet Take 1-2 tablets (50-100 mg total) by mouth every 6 (six) hours as needed for pain.  30 tablet  3  . triamcinolone (KENALOG) 0.025 % cream Apply 2-3 times daily to skin eruptions, hives      . omeprazole (PRILOSEC) 20 MG capsule Take 20 mg by mouth daily.       No facility-administered medications prior to visit.    PE: Blood pressure 118/64, pulse 56, height 5\' 11"  (1.803 m), weight 232 lb (105.235 kg). Gen: Alert, well appearing.  Patient is oriented to person, place, time, and situation. ENT: Ears: EACs clear, normal epithelium.  TMs with good light reflex and landmarks bilaterally.  Eyes: no injection, icteris, swelling, or exudate.  EOMI, PERRLA. Nose: no drainage or turbinate edema/swelling.  No injection or focal lesion.  Mouth: lips without lesion/swelling.  Oral mucosa pink and moist.  Dentition intact and without obvious caries or gingival swelling.  Oropharynx without erythema, exudate, or swelling.  Neck - No masses or thyromegaly or limitation in range of motion CV: RRR, no m/r/g.   LUNGS: CTA bilat, nonlabored resps, good aeration in all lung fields. ABD: soft, NT, ND, BS normal.  No hepatospenomegaly or mass.  No bruits.  IMPRESSION AND PLAN:   1) Flatulence, fecal urgency/frequency, unclear duration but best I can tell it has been about 4-6 wks. No wt loss or appetite issues.  No recent antibiotics.  Does not seem clearly triggered by any one group/type of foods. Will check general lab panel and stool studies.  Try imodium OTC bid.  Stop metamucil.  2) Environmental allergies + ? Of allergic etiology of his chronic dermatitis: pt requested referral to allergist so I have ordered this today.  His skin looks as good as I have ever seen it.    FOLLOW UP:  prn

## 2012-04-11 NOTE — Patient Instructions (Addendum)
Imodium 1-2 tabs twice per day as needed for explosive flatulence/bowel movement. Stop metamucil.

## 2012-04-12 LAB — TISSUE TRANSGLUTAMINASE, IGA: Tissue Transglutaminase Ab, IgA: 22 U/mL — ABNORMAL HIGH (ref <20)

## 2012-04-13 ENCOUNTER — Encounter: Payer: Self-pay | Admitting: Family Medicine

## 2012-05-09 ENCOUNTER — Telehealth: Payer: Self-pay | Admitting: *Deleted

## 2012-05-09 MED ORDER — ACETAZOLAMIDE 250 MG PO TABS: ORAL_TABLET | ORAL | Status: DC

## 2012-05-09 NOTE — Telephone Encounter (Signed)
Rx request to pharmacy/SLS  

## 2012-06-22 ENCOUNTER — Telehealth: Payer: Self-pay | Admitting: *Deleted

## 2012-06-22 NOTE — Telephone Encounter (Signed)
Received fax request from [facility]] requesting refills for Acetazolamide & Metoprolol; pt's EMR record shows the following: Acetazolamide: Last Rx given 05.05.14 #90x1 [#45 for 30 days]; pharmacy states they filled prescription on 05.05.14 & 06.09.14 which would have been 63-month early, new Rx should not be due until September '14. Metoprolol: Last Rx given 08.02.13 #90x3 [1-Year supply]; pharmacy states they filled prescription 08.03.13; November '13; February '14 & 04.30.14 which would have been a 15-month supply, not due until beginning of August '14. Both Requests are too early for refills, informed pharmacy; pharmacy states they are automatically sent out when there are no refills remaining on prescription/SLS

## 2012-06-24 ENCOUNTER — Ambulatory Visit: Payer: Medicare Other | Admitting: Podiatry

## 2012-08-05 ENCOUNTER — Other Ambulatory Visit: Payer: Self-pay | Admitting: Family Medicine

## 2012-08-05 MED ORDER — METOPROLOL SUCCINATE ER 25 MG PO TB24: 25.0000 mg | ORAL_TABLET | Freq: Every day | ORAL | Status: DC

## 2012-09-13 ENCOUNTER — Other Ambulatory Visit: Payer: Self-pay | Admitting: Family Medicine

## 2012-09-13 NOTE — Telephone Encounter (Signed)
Pharmacy sent refill requests for acetazolamide, clopidogrel, tamsulosin.  Patient hasn't been seen since 04/11/12.  No upcoming appointment but all follow ups say prn.  Please advise refills.

## 2012-09-14 MED ORDER — CLOPIDOGREL BISULFATE 75 MG PO TABS: 75.0000 mg | ORAL_TABLET | Freq: Every day | ORAL | Status: DC

## 2012-09-14 MED ORDER — TAMSULOSIN HCL 0.4 MG PO CAPS: 0.4000 mg | ORAL_CAPSULE | Freq: Every day | ORAL | Status: DC

## 2012-09-14 MED ORDER — ACETAZOLAMIDE 250 MG PO TABS: ORAL_TABLET | ORAL | Status: DC

## 2012-09-14 NOTE — Telephone Encounter (Signed)
Refills authorized, but pls call pt's interpreter--Ally-and tell her I would like for him to have a routine f/u office visit in 2 months.-thx

## 2012-10-06 ENCOUNTER — Other Ambulatory Visit: Payer: Self-pay | Admitting: Family Medicine

## 2012-10-06 MED ORDER — FLUTICASONE PROPIONATE 50 MCG/ACT NA SUSP: NASAL | Status: DC

## 2012-11-10 ENCOUNTER — Encounter: Payer: Self-pay | Admitting: Family Medicine

## 2012-11-10 ENCOUNTER — Ambulatory Visit (INDEPENDENT_AMBULATORY_CARE_PROVIDER_SITE_OTHER): Payer: Medicare Other | Admitting: Family Medicine

## 2012-11-10 VITALS — BP 124/72 | HR 51 | Temp 97.8°F | Resp 18 | Ht 70.0 in | Wt 232.0 lb

## 2012-11-10 DIAGNOSIS — M545 Low back pain: Secondary | ICD-10-CM

## 2012-11-10 NOTE — Progress Notes (Signed)
OFFICE NOTE  11/10/2012  CC:  Chief Complaint  Patient presents with  . Back Pain    Left sided early this am around 3am     HPI: Patient is a 79 y.o. Caucasian male who is here for left low back/flank area. Onset about 3 AM today when he got up to use the bathroom he noted left lower back pain.  No radiation. Intensity 5/10.  It is constant.  He took 2 tramadol and noted no change.  It reminds him a little of the pain he had in the past with a kidney stone (remote past, no procedures to remove stones have ever had to be done).  Pushing on the region does make it feel a little worse.  Nothing makes it better. No change in urine color.  Pain intensity No recent low back straining with lifting or bending.    Pertinent PMH:  Past Medical History  Diagnosis Date  . Memory loss   . Hypertrophy of prostate without urinary obstruction and other lower urinary tract symptoms (LUTS)   . Arthropathy, unspecified, site unspecified   . Cerebrovascular disease, unspecified   . Other and unspecified hyperlipidemia   . Depressive disorder, not elsewhere classified   . Personal history of unspecified circulatory disease   . Coronary atherosclerosis of unspecified type of vessel, native or graft     s/p stent placement "decades ago", hx of cath after this which showed patent stent per pt report.  Marland Kitchen Hearing loss of both ears   . Urticarial dermatitis     Skin biopsy 11/2010 favored "urticarial allergic rxn related to localized hypersensitivity such as insect bite or arthropod assault"  . Venous stasis dermatitis, left 07/2007    Biopsy showed angiodermatitis, with all tests for infection NEG (fungal stain, AFB stain, and gram stain).  . Chronic venous insufficiency   . BPH (benign prostatic hypertrophy)   . Lichen simplex chronicus 08/2011  . History of actinic keratoses 08/2011  . Osteoarthritis 06/2009    X-rays: both hips-mild  . Lumbar degenerative disc disease 06/2009    Mild osteoarthritic  changes of L-spine diffusely on x-rays   Past Surgical History  Procedure Laterality Date  . Appendectomy    . Coronary angioplasty with stent placement    . Tonsillectomy    . Cataract extraction  10/24/2007    right  . Cataract extraction  11/07/2007    left    MEDS:  Outpatient Prescriptions Prior to Visit  Medication Sig Dispense Refill  . acetaZOLAMIDE (DIAMOX) 250 MG tablet ALTERNATE 2 TABLETS BY MOUTH EVERY DAY WITH 1 TABLET BY MOUTH EVERYDAY  90 tablet  1  . aspirin 81 MG tablet Take 160 mg by mouth daily.      . clopidogrel (PLAVIX) 75 MG tablet Take 1 tablet (75 mg total) by mouth daily.  90 tablet  1  . diphenhydrAMINE (BENADRYL MAXIMUM STRENGTH) 2 % cream Apply topically 3 (three) times daily as needed.      . ferrous sulfate 325 (65 FE) MG tablet Take 325 mg by mouth 2 (two) times daily.      Marland Kitchen FLUoxetine (PROZAC) 20 MG capsule Take 20 mg by mouth daily.      . fluticasone (FLONASE) 50 MCG/ACT nasal spray 2 sprays in each nostril once daily  16 g  3  . metoprolol succinate (TOPROL-XL) 25 MG 24 hr tablet Take 1 tablet (25 mg total) by mouth daily.  90 tablet  1  . Naproxen (NAPROSYN  PO) Take by mouth as needed.      Marland Kitchen omeprazole (PRILOSEC) 20 MG capsule Take 20 mg by mouth daily.      . Potassium Gluconate 550 MG TABS Take 1 tablet by mouth daily.      . pseudoephedrine-guaifenesin (MUCINEX D) 60-600 MG per tablet Take 1 tablet by mouth every 12 (twelve) hours.      . psyllium (METAMUCIL) 58.6 % packet Take 1 packet by mouth as needed.      . simvastatin (ZOCOR) 80 MG tablet Take 1 tablet (80 mg total) by mouth at bedtime.  90 tablet  1  . tamsulosin (FLOMAX) 0.4 MG CAPS capsule Take 1 capsule (0.4 mg total) by mouth daily.  30 capsule  6  . temazepam (RESTORIL) 15 MG capsule 2 caps po qhs for insomnia  60 capsule  1  . traMADol (ULTRAM) 50 MG tablet Take 1-2 tablets (50-100 mg total) by mouth every 6 (six) hours as needed for pain.  30 tablet  3  . triamcinolone  (KENALOG) 0.025 % cream Apply 2-3 times daily to skin eruptions, hives      . famotidine (PEPCID) 20 MG tablet Take 20 mg by mouth as needed.        No facility-administered medications prior to visit.    PE: Blood pressure 124/72, pulse 51, temperature 97.8 F (36.6 C), temperature source Temporal, resp. rate 18, height 5\' 10"  (1.778 m), weight 232 lb (105.235 kg), SpO2 95.00%. Gen: Alert, well appearing.  Patient is oriented to person, place, time, and situation. ENT:   Eyes: no injection, icteris, swelling, or exudate.  EOMI, PERRLA. Nose: no drainage or turbinate edema/swelling.  No injection or focal lesion.  Mouth: lips without lesion/swelling.  Oral mucosa pink and moist.  Dentition intact and without obvious caries or gingival swelling.  Oropharynx without erythema, exudate, or swelling.  Neck - No masses or thyromegaly or limitation in range of motion CV: RRR, no m/r/g.   LUNGS: CTA bilat, nonlabored resps, good aeration in all lung fields.  Trace/faint early insp crackles in both bases, L>R.   ABD: soft, NT, ND but rotund, BS normal.  No hepatospenomegaly or mass.  No bruits. EXT: 3+ pitting edema in both lower legs Neuro: CN 2-12 intact bilaterally, strength 5/5 in proximal and distal upper extremities and lower extremities bilaterally.   No tremor. No ataxia.   No pronator drift. BACK: no CVA tenderness, no flank tenderness.  He has focal mild tenderness to deep palpation in soft tissue of lower/mid lumbar spine.  No spinous process tenderness.  IMPRESSION AND PLAN:  Acute low back pain With palpable focal muscle tenderness. Reassured patient that he has no s/s of kidney stones at this time so no further w/u for this is indicated unless sx's change--we reviewed these sx's (flank pain radiating to groin, nausea, much more intense pain, hematuria). He will apply heat to his low back.  I recommended he avoid using NSAIDs for body pain more than 2 times per week since he is also on  ASA and plavix.  An After Visit Summary was printed and given to the patient.  FOLLOW UP: prn

## 2012-11-10 NOTE — Assessment & Plan Note (Signed)
With palpable focal muscle tenderness. Reassured patient that he has no s/s of kidney stones at this time so no further w/u for this is indicated unless sx's change--we reviewed these sx's (flank pain radiating to groin, nausea, much more intense pain, hematuria). He will apply heat to his low back.  I recommended he avoid using NSAIDs for body pain more than 2 times per week since he is also on ASA and plavix.

## 2012-11-30 ENCOUNTER — Ambulatory Visit: Payer: Medicare Other | Admitting: Podiatrist

## 2012-12-07 ENCOUNTER — Telehealth: Payer: Self-pay | Admitting: Family Medicine

## 2012-12-07 NOTE — Telephone Encounter (Signed)
Ally received a phone call from the patient stating he had received a call from our office but he couldn't understand what the person was saying. Please call Ally back.

## 2012-12-08 NOTE — Telephone Encounter (Signed)
I've left a message for Ally to [facility]

## 2012-12-08 NOTE — Telephone Encounter (Signed)
I haven't tried to call this patient.  Patient has an upcoming appointment with podiatry, maybe this is who called her.   Will you please call Ally, I don't have her number since I'm at a different office.

## 2012-12-12 NOTE — Telephone Encounter (Signed)
Jeff Hunt has been advised we are not trying to reach the patient.

## 2012-12-21 ENCOUNTER — Ambulatory Visit: Payer: Medicare Other | Admitting: Podiatrist

## 2013-01-05 DIAGNOSIS — K625 Hemorrhage of anus and rectum: Secondary | ICD-10-CM

## 2013-01-05 HISTORY — DX: Hemorrhage of anus and rectum: K62.5

## 2013-01-09 ENCOUNTER — Ambulatory Visit: Payer: Medicare Other | Admitting: Family Medicine

## 2013-01-11 ENCOUNTER — Ambulatory Visit: Payer: Medicare Other | Admitting: Family Medicine

## 2013-01-12 ENCOUNTER — Encounter: Payer: Self-pay | Admitting: Podiatrist

## 2013-01-12 ENCOUNTER — Ambulatory Visit (INDEPENDENT_AMBULATORY_CARE_PROVIDER_SITE_OTHER): Payer: Medicare Other | Admitting: Podiatrist

## 2013-01-12 VITALS — BP 145/67 | HR 64 | Resp 12

## 2013-01-12 DIAGNOSIS — M79609 Pain in unspecified limb: Secondary | ICD-10-CM

## 2013-01-12 DIAGNOSIS — B351 Tinea unguium: Secondary | ICD-10-CM

## 2013-01-12 NOTE — Progress Notes (Signed)
HPI:  Patient presents today for follow up of foot and nail care. Denies any new complaints today.  Objective:  Patients chart is reviewed.  Neurovascular status unchanged.  Patients nails are thickened, discolored, distrophic, friable and brittle with yellow-brown discoloration. Patient subjectively relates they are painful with shoes and with ambulation of bilateral feet.  Assessment:  Symptomatic onychomycosis  Plan:  Discussed treatment options and alternatives.  The symptomatic toenails were debrided through manual an mechanical means without complication.  Return appointment recommended at routine intervals of 3 months    Kathryn Egerton, DPM   

## 2013-01-16 ENCOUNTER — Other Ambulatory Visit: Payer: Self-pay | Admitting: Family Medicine

## 2013-01-16 MED ORDER — ACETAZOLAMIDE 250 MG PO TABS: ORAL_TABLET | ORAL | Status: DC

## 2013-01-16 NOTE — Telephone Encounter (Signed)
Refilled acetazolamide 250mg  tabs for 90 day supply x 1 refill per Dr. Milinda CaveMcGowen.

## 2013-01-27 ENCOUNTER — Other Ambulatory Visit: Payer: Self-pay | Admitting: Family Medicine

## 2013-01-27 MED ORDER — METOPROLOL SUCCINATE ER 25 MG PO TB24: 25.0000 mg | ORAL_TABLET | Freq: Every day | ORAL | Status: DC

## 2013-03-06 ENCOUNTER — Other Ambulatory Visit: Payer: Self-pay

## 2013-03-06 MED ORDER — CLOPIDOGREL BISULFATE 75 MG PO TABS: 75.0000 mg | ORAL_TABLET | Freq: Every day | ORAL | Status: DC

## 2013-03-27 ENCOUNTER — Other Ambulatory Visit: Payer: Self-pay | Admitting: Family Medicine

## 2013-03-27 MED ORDER — SIMVASTATIN 80 MG PO TABS: 80.0000 mg | ORAL_TABLET | Freq: Every day | ORAL | Status: DC

## 2013-04-13 ENCOUNTER — Ambulatory Visit (INDEPENDENT_AMBULATORY_CARE_PROVIDER_SITE_OTHER): Payer: Medicare Other | Admitting: Podiatrist

## 2013-04-13 ENCOUNTER — Other Ambulatory Visit: Payer: Self-pay | Admitting: Family Medicine

## 2013-04-13 ENCOUNTER — Encounter: Payer: Self-pay | Admitting: Podiatrist

## 2013-04-13 VITALS — BP 148/70 | HR 52 | Resp 18 | Ht 71.0 in | Wt 235.0 lb

## 2013-04-13 DIAGNOSIS — M79609 Pain in unspecified limb: Secondary | ICD-10-CM

## 2013-04-13 DIAGNOSIS — B351 Tinea unguium: Secondary | ICD-10-CM

## 2013-04-13 NOTE — Telephone Encounter (Signed)
Patient has not been seen for chronic illnesses in over a year only acute visits.  Please advise refills.

## 2013-04-13 NOTE — Progress Notes (Signed)
HPI: Patient presents today for follow up of foot and nail care. Denies any new complaints today. Presents with his interpreter "Ellie"  Objective: Patients chart is reviewed. Neurovascular status unchanged. Patients nails are thickened, discolored, distrophic, friable and brittle with yellow-brown discoloration. Patient subjectively relates they are painful with shoes and with ambulation of bilateral feet. Bilateral hallux nails severely thick and incurvated.  Assessment: Symptomatic onychomycosis  Plan: Discussed treatment options and alternatives. The symptomatic toenails were debrided through manual an mechanical means without complication. Return appointment recommended at routine intervals of 3 months  Areej Tayler, DPM  

## 2013-06-04 ENCOUNTER — Other Ambulatory Visit: Payer: Self-pay | Admitting: Family Medicine

## 2013-06-13 ENCOUNTER — Telehealth: Payer: Self-pay | Admitting: Family Medicine

## 2013-06-13 NOTE — Telephone Encounter (Signed)
Rx refill request from pharmacy for prozac.  Patient hasn't been seen since November of last year.  Please advise rf.

## 2013-06-14 MED ORDER — FLUOXETINE HCL 20 MG PO CAPS: 20.0000 mg | ORAL_CAPSULE | Freq: Every day | ORAL | Status: DC

## 2013-06-14 NOTE — Telephone Encounter (Signed)
OK to RF prozac x 2 more months.  Pust have 15 min appt for f/u for routine med recheck regarding this med before any FURTHER refills.-thx

## 2013-06-14 NOTE — Telephone Encounter (Signed)
Rx has been filled.  Please call pt and make appointment.

## 2013-06-15 NOTE — Telephone Encounter (Signed)
Jeff Hunt is going to contact patient & then CB to schedule an appointment.

## 2013-07-13 ENCOUNTER — Ambulatory Visit: Payer: Medicare Other | Admitting: Podiatrist

## 2013-07-14 NOTE — Telephone Encounter (Signed)
Patient scheduled 07/18/13

## 2013-07-18 ENCOUNTER — Encounter: Payer: Self-pay | Admitting: Family Medicine

## 2013-07-18 ENCOUNTER — Ambulatory Visit (INDEPENDENT_AMBULATORY_CARE_PROVIDER_SITE_OTHER): Payer: Medicare Other | Admitting: Family Medicine

## 2013-07-18 VITALS — BP 127/74 | HR 55 | Temp 98.2°F | Resp 16 | Ht 70.0 in | Wt 225.0 lb

## 2013-07-18 DIAGNOSIS — K219 Gastro-esophageal reflux disease without esophagitis: Secondary | ICD-10-CM

## 2013-07-18 DIAGNOSIS — F3289 Other specified depressive episodes: Secondary | ICD-10-CM

## 2013-07-18 DIAGNOSIS — Z0000 Encounter for general adult medical examination without abnormal findings: Secondary | ICD-10-CM | POA: Insufficient documentation

## 2013-07-18 DIAGNOSIS — R1310 Dysphagia, unspecified: Secondary | ICD-10-CM

## 2013-07-18 DIAGNOSIS — Z23 Encounter for immunization: Secondary | ICD-10-CM

## 2013-07-18 DIAGNOSIS — F329 Major depressive disorder, single episode, unspecified: Secondary | ICD-10-CM

## 2013-07-18 MED ORDER — PANTOPRAZOLE SODIUM 40 MG PO TBEC: 40.0000 mg | DELAYED_RELEASE_TABLET | Freq: Every day | ORAL | Status: DC

## 2013-07-18 MED ORDER — FLUOXETINE HCL 40 MG PO CAPS: 40.0000 mg | ORAL_CAPSULE | Freq: Every day | ORAL | Status: DC

## 2013-07-18 NOTE — Assessment & Plan Note (Signed)
Needs to restart daily PPI: pantoprazole 40mg  qd rx'd today. He has persistent dysphagia and this is most likely secondary to esophageal stricture from chonic poorly controlled GERD. Per pt's request, new GI referral made to [facility] today.

## 2013-07-18 NOTE — Assessment & Plan Note (Signed)
Prevnar 13 IM today. 

## 2013-07-18 NOTE — Progress Notes (Signed)
Pre visit review using our clinic review tool, if applicable. No additional management support is needed unless otherwise documented below in the visit note. 

## 2013-07-18 NOTE — Progress Notes (Signed)
OFFICE VISIT  07/18/2013   CC:  Chief Complaint  Patient presents with  . Medication Refill    not fasting.    HPI:    Patient is a 80 y.o. Caucasian male who presents with interpreter, Connye BurkittAlly, for f/u chronic med issues: depression, hyperlipidemia, hx of CAD, LE venous insufficiency edema, hearing loss both ears, osteoarthritis both hips, DDD L spine.  He has c/o long hist of difficulty swallowing solid food -"often feels like it gets stuck".  Long hx of GERD but not on daily PPI or H2 blocker anymore. Last saw GI MD about 2 yrs ago, says he felt like the MD's mind was not on his job and he has tried to get in with another [facility] MD but there was hesitancy on the GI side with doing this.  Therefore, they ask for referral to different GI group today.  Still feels depressed most days, has been on 20mg  daily fluoxetine for "20 yrs or so".  Crying spells common.  He denies SI or HI. He is open to increasing fluoxetine dosing.  He says he hates to go to psychiatrists.  ROS: easy bruisability, some BRBPR --painless--sometimes.  Occ sharp left sided lower CP unrelated to activity, question of related to ingestion of food,   Past Medical History  Diagnosis Date  . Memory loss   . Hypertrophy of prostate without urinary obstruction and other lower urinary tract symptoms (LUTS)   . Arthropathy, unspecified, site unspecified   . Cerebrovascular disease, unspecified   . Other and unspecified hyperlipidemia   . Depressive disorder, not elsewhere classified   . Personal history of unspecified circulatory disease   . Coronary atherosclerosis of unspecified type of vessel, native or graft     s/p stent placement "decades ago", hx of cath after this which showed patent stent per pt report.  Marland Kitchen. Hearing loss of both ears   . Urticarial dermatitis     Skin biopsy 11/2010 favored "urticarial allergic rxn related to localized hypersensitivity such as insect bite or arthropod assault"  . Venous stasis  dermatitis, left 07/2007    Biopsy showed angiodermatitis, with all tests for infection NEG (fungal stain, AFB stain, and gram stain).  . Chronic venous insufficiency   . BPH (benign prostatic hypertrophy)   . Lichen simplex chronicus 08/2011  . History of actinic keratoses 08/2011  . Osteoarthritis 06/2009    X-rays: both hips-mild  . Lumbar degenerative disc disease 06/2009    Mild osteoarthritic changes of L-spine diffusely on x-rays    Past Surgical History  Procedure Laterality Date  . Appendectomy    . Coronary angioplasty with stent placement    . Tonsillectomy    . Cataract extraction  10/24/2007    right  . Cataract extraction  11/07/2007    left   MEDS: not taking omeprazole, vicodin, or tramadol listed below Outpatient Prescriptions Prior to Visit  Medication Sig Dispense Refill  . acetaZOLAMIDE (DIAMOX) 250 MG tablet ALTERNATE 2 TABLETS BY MOUTH EVERY DAY WITH 1 TABLET BY MOUTH EVERY DAY  90 tablet  6  . aspirin 81 MG tablet Take 160 mg by mouth daily.      . clopidogrel (PLAVIX) 75 MG tablet TAKE 1 TABLET BY MOUTH EVERY DAY.  90 tablet  0  . diphenhydrAMINE (BENADRYL MAXIMUM STRENGTH) 2 % cream Apply topically 3 (three) times daily as needed.      . ferrous sulfate 325 (65 FE) MG tablet Take 325 mg by mouth 2 (two)  times daily.      . fluticasone (FLONASE) 50 MCG/ACT nasal spray 2 sprays in each nostril once daily  16 g  3  . metoprolol succinate (TOPROL-XL) 25 MG 24 hr tablet Take 1 tablet (25 mg total) by mouth daily.  90 tablet  1  . Naproxen (NAPROSYN PO) Take by mouth as needed.      . Potassium Gluconate 550 MG TABS Take 1 tablet by mouth daily.      . pseudoephedrine-guaifenesin (MUCINEX D) 60-600 MG per tablet Take 1 tablet by mouth every 12 (twelve) hours.      . psyllium (METAMUCIL) 58.6 % packet Take 1 packet by mouth as needed.      . simvastatin (ZOCOR) 80 MG tablet Take 1 tablet (80 mg total) by mouth at bedtime.  90 tablet  1  . tamsulosin (FLOMAX) 0.4 MG  CAPS capsule TAKE ONE CAPSULE BY MOUTH DAILY  30 capsule  6  . temazepam (RESTORIL) 15 MG capsule 2 caps po qhs for insomnia  60 capsule  1  . FLUoxetine (PROZAC) 20 MG capsule Take 1 capsule (20 mg total) by mouth daily.  30 capsule  2  . traMADol (ULTRAM) 50 MG tablet Take 1-2 tablets (50-100 mg total) by mouth every 6 (six) hours as needed for pain.  30 tablet  3  . triamcinolone (KENALOG) 0.025 % cream Apply 2-3 times daily to skin eruptions, hives      . HYDROcodone-acetaminophen (NORCO/VICODIN) 5-325 MG per tablet       . omeprazole (PRILOSEC) 20 MG capsule Take 20 mg by mouth daily.       No facility-administered medications prior to visit.    Allergies  Allergen Reactions  . Amoxicillin Rash  . Penicillins Rash    Severe skin problems    ROS As per HPI  PE: Blood pressure 127/74, pulse 55, temperature 98.2 F (36.8 C), temperature source Temporal, resp. rate 16, height 5\' 10"  (1.778 m), weight 225 lb (102.059 kg), SpO2 95.00%. Gen: Alert, well appearing.  Patient is oriented to person, place, time, and situation. AFFECT: pleasant, lucid thought and speech. CV: Regular rhythm, rate about 60s, occ premature beat, no m/r/g Chest is clear, no wheezing or rales. Normal symmetric air entry throughout both lung fields. No chest wall deformities or tenderness. EXT: no cyanosis or clubbing.  2+ bilat LE edema, without erythema or tenderness or skin breakdown  LABS:  None today  IMPRESSION AND PLAN:  DEPRESSION Not well controlled. Increase fluoxetine to 40mg  qd.  Therapeutic expectations and side effect profile of medication discussed today.  Patient's questions answered.   GERD (gastroesophageal reflux disease) Needs to restart daily PPI: pantoprazole 40mg  qd rx'd today. He has persistent dysphagia and this is most likely secondary to esophageal stricture from chonic poorly controlled GERD. Per pt's request, new GI referral made to [facility] today.  Preventative health  care Prevnar 13 IM today.   An After Visit Summary was printed and given to the patient.  FOLLOW UP: Return in about 4 weeks (around 08/15/2013) for f/u depression--needs to be fasting for FLP, CBC, CMET.

## 2013-07-18 NOTE — Patient Instructions (Signed)
Finish your fluoxetine 20mg  tabs by taking TWO caps once a day, then fill new rx for 40mg  fluoxetine caps and take one daily as per instructions on the bottle.

## 2013-07-18 NOTE — Assessment & Plan Note (Signed)
Not well controlled. Increase fluoxetine to 40mg  qd.  Therapeutic expectations and side effect profile of medication discussed today.  Patient's questions answered.

## 2013-07-20 ENCOUNTER — Ambulatory Visit (INDEPENDENT_AMBULATORY_CARE_PROVIDER_SITE_OTHER): Payer: Medicare Other | Admitting: Podiatrist

## 2013-07-20 DIAGNOSIS — M79609 Pain in unspecified limb: Secondary | ICD-10-CM

## 2013-07-20 DIAGNOSIS — B351 Tinea unguium: Secondary | ICD-10-CM

## 2013-07-20 DIAGNOSIS — M79676 Pain in unspecified toe(s): Principal | ICD-10-CM

## 2013-07-20 NOTE — Progress Notes (Signed)
HPI: Patient presents today for follow up of foot and nail care. Denies any new complaints today. Presents with his interpreter "Ellie"  Objective: Patients chart is reviewed. Neurovascular status unchanged. Patients nails are thickened, discolored, distrophic, friable and brittle with yellow-brown discoloration. Patient subjectively relates they are painful with shoes and with ambulation of bilateral feet. Bilateral hallux nails severely thick and incurvated.  Assessment: Symptomatic onychomycosis  Plan: Discussed treatment options and alternatives. The symptomatic toenails were debrided through manual an mechanical means without complication. Return appointment recommended at routine intervals of 3 months  Adi Doro, DPM  

## 2013-07-22 ENCOUNTER — Other Ambulatory Visit: Payer: Self-pay | Admitting: Family Medicine

## 2013-08-15 ENCOUNTER — Ambulatory Visit (INDEPENDENT_AMBULATORY_CARE_PROVIDER_SITE_OTHER): Payer: Medicare Other | Admitting: Family Medicine

## 2013-08-15 ENCOUNTER — Encounter: Payer: Self-pay | Admitting: Family Medicine

## 2013-08-15 VITALS — BP 133/68 | HR 52 | Temp 98.0°F | Resp 20 | Ht 70.0 in | Wt 225.0 lb

## 2013-08-15 DIAGNOSIS — F329 Major depressive disorder, single episode, unspecified: Secondary | ICD-10-CM

## 2013-08-15 DIAGNOSIS — K2100 Gastro-esophageal reflux disease with esophagitis, without bleeding: Secondary | ICD-10-CM

## 2013-08-15 DIAGNOSIS — F3289 Other specified depressive episodes: Secondary | ICD-10-CM

## 2013-08-15 DIAGNOSIS — E785 Hyperlipidemia, unspecified: Secondary | ICD-10-CM

## 2013-08-15 DIAGNOSIS — I10 Essential (primary) hypertension: Secondary | ICD-10-CM

## 2013-08-15 DIAGNOSIS — Z79899 Other long term (current) drug therapy: Secondary | ICD-10-CM

## 2013-08-15 LAB — LIPID PANEL
CHOL/HDL RATIO: 3
Cholesterol: 129 mg/dL (ref 0–200)
HDL: 37.3 mg/dL — AB (ref >39.00)
LDL Cholesterol: 76 mg/dL (ref 0–99)
NONHDL: 91.7
Triglycerides: 77 mg/dL (ref 0.0–149.0)
VLDL: 15.4 mg/dL (ref 0.0–40.0)

## 2013-08-15 LAB — CBC WITH DIFFERENTIAL/PLATELET
BASOS ABS: 0 10*3/uL (ref 0.0–0.1)
Basophils Relative: 0.6 % (ref 0.0–3.0)
EOS PCT: 3.5 % (ref 0.0–5.0)
Eosinophils Absolute: 0.2 10*3/uL (ref 0.0–0.7)
HEMATOCRIT: 40.3 % (ref 39.0–52.0)
Hemoglobin: 13.2 g/dL (ref 13.0–17.0)
LYMPHS ABS: 0.7 10*3/uL (ref 0.7–4.0)
LYMPHS PCT: 13.3 % (ref 12.0–46.0)
MCHC: 32.8 g/dL (ref 30.0–36.0)
MCV: 95.3 fl (ref 78.0–100.0)
MONOS PCT: 8 % (ref 3.0–12.0)
Monocytes Absolute: 0.4 10*3/uL (ref 0.1–1.0)
Neutro Abs: 3.8 10*3/uL (ref 1.4–7.7)
Neutrophils Relative %: 74.6 % (ref 43.0–77.0)
Platelets: 149 10*3/uL — ABNORMAL LOW (ref 150.0–400.0)
RBC: 4.23 Mil/uL (ref 4.22–5.81)
RDW: 13.9 % (ref 11.5–15.5)
WBC: 5.1 10*3/uL (ref 4.0–10.5)

## 2013-08-15 LAB — COMPREHENSIVE METABOLIC PANEL
ALT: 11 U/L (ref 0–53)
AST: 19 U/L (ref 0–37)
Albumin: 3.7 g/dL (ref 3.5–5.2)
Alkaline Phosphatase: 60 U/L (ref 39–117)
BILIRUBIN TOTAL: 0.7 mg/dL (ref 0.2–1.2)
BUN: 17 mg/dL (ref 6–23)
CALCIUM: 8.7 mg/dL (ref 8.4–10.5)
CHLORIDE: 110 meq/L (ref 96–112)
CO2: 22 meq/L (ref 19–32)
Creatinine, Ser: 1.1 mg/dL (ref 0.4–1.5)
GFR: 71.11 mL/min (ref >60.00)
GLUCOSE: 98 mg/dL (ref 70–99)
Potassium: 4.3 mEq/L (ref 3.5–5.1)
Sodium: 138 mEq/L (ref 135–145)
TOTAL PROTEIN: 6.2 g/dL (ref 6.0–8.3)

## 2013-08-15 NOTE — Progress Notes (Signed)
OFFICE NOTE  08/15/2013  CC:  Chief Complaint  Patient presents with  . Follow-up   HPI: Patient is a 80 y.o. Caucasian male who is here with interpreter, Connye BurkittAlly, for 1 month f/u depression and GERD with dysphagia. Last visit we increased his prozac to 40mg  qd and got him back on daily PPI (pantoprazole). He is feeling much better, wants to stay on prozac at 40mg .  Also says that since getting on daily pantoprazole and changing some things with his diet, his heartburn and dysphagia are gone. Still considering seeing GI MD but looking around for one he can find good reviews on and is taking new pt's.  No new complaints.   Pertinent PMH:  Past medical, surgical, social, and family history reviewed and no changes are noted since last office visit.  MEDS:  Outpatient Prescriptions Prior to Visit  Medication Sig Dispense Refill  . acetaZOLAMIDE (DIAMOX) 250 MG tablet ALTERNATE 2 TABLETS BY MOUTH EVERY DAY WITH 1 TABLET BY MOUTH EVERY DAY  90 tablet  6  . aspirin 81 MG tablet Take 160 mg by mouth daily.      . clopidogrel (PLAVIX) 75 MG tablet TAKE 1 TABLET BY MOUTH EVERY DAY.  90 tablet  0  . diphenhydrAMINE (BENADRYL MAXIMUM STRENGTH) 2 % cream Apply topically 3 (three) times daily as needed.      . ferrous sulfate 325 (65 FE) MG tablet Take 325 mg by mouth 2 (two) times daily.      Marland Kitchen. FLUoxetine (PROZAC) 40 MG capsule Take 1 capsule (40 mg total) by mouth daily.  30 capsule  0  . fluticasone (FLONASE) 50 MCG/ACT nasal spray 2 sprays in each nostril once daily  16 g  3  . metoprolol succinate (TOPROL-XL) 25 MG 24 hr tablet TAKE 1 TABLET BY MOUTH EVERY DAY.  90 tablet  0  . Naproxen (NAPROSYN PO) Take by mouth as needed.      . pantoprazole (PROTONIX) 40 MG tablet Take 1 tablet (40 mg total) by mouth daily.  30 tablet  5  . Potassium Gluconate 550 MG TABS Take 1 tablet by mouth daily.      . pseudoephedrine-guaifenesin (MUCINEX D) 60-600 MG per tablet Take 1 tablet by mouth every 12  (twelve) hours.      . psyllium (METAMUCIL) 58.6 % packet Take 1 packet by mouth as needed.      . simvastatin (ZOCOR) 80 MG tablet Take 1 tablet (80 mg total) by mouth at bedtime.  90 tablet  1  . tamsulosin (FLOMAX) 0.4 MG CAPS capsule TAKE ONE CAPSULE BY MOUTH DAILY  30 capsule  6  . temazepam (RESTORIL) 15 MG capsule 2 caps po qhs for insomnia  60 capsule  1  . traMADol (ULTRAM) 50 MG tablet Take 1-2 tablets (50-100 mg total) by mouth every 6 (six) hours as needed for pain.  30 tablet  3  . triamcinolone (KENALOG) 0.025 % cream Apply 2-3 times daily to skin eruptions, hives       No facility-administered medications prior to visit.    PE: Blood pressure 133/68, pulse 52, temperature 98 F (36.7 C), temperature source Temporal, resp. rate 20, height 5\' 10"  (1.778 m), weight 225 lb (102.059 kg), SpO2 93.00%. Gen: Alert, well appearing.  Patient is oriented to person, place, time, and situation. ZOX:WRUEENT:Eyes: no injection, icteris, swelling, or exudate.  EOMI, PERRLA. Mouth: lips without lesion/swelling.  Oral mucosa pink and moist. Oropharynx without erythema, exudate, or swelling.  CV:  RRR, no m/r/g.   LUNGS: CTA bilat, nonlabored resps, good aeration in all lung fields. EXT: no clubbing or cyanosis.  3+ pitting edema below the knees bilat, w/out rash.  IMPRESSION AND PLAN:  1) Depression: improving signif.  Continue 40mg  qd prozac.  2) GERD with dysphagia: resolved/well controlled on pantoprazole 40mg  qd.  3) Hyperlip/HTN/polypharmacy: routine labs done today (CBC, CMET, FLP).  An After Visit Summary was printed and given to the patient.  FOLLOW UP: 50mo

## 2013-08-15 NOTE — Progress Notes (Signed)
Pre visit review using our clinic review tool, if applicable. No additional management support is needed unless otherwise documented below in the visit note. 

## 2013-08-16 ENCOUNTER — Other Ambulatory Visit: Payer: Self-pay | Admitting: Family Medicine

## 2013-08-16 ENCOUNTER — Telehealth: Payer: Self-pay | Admitting: Family Medicine

## 2013-08-16 NOTE — Telephone Encounter (Signed)
Relevant patient education assigned to patient using Emmi. ° °

## 2013-09-01 ENCOUNTER — Other Ambulatory Visit: Payer: Self-pay | Admitting: Family Medicine

## 2013-09-18 ENCOUNTER — Other Ambulatory Visit: Payer: Self-pay | Admitting: Family Medicine

## 2013-09-29 ENCOUNTER — Other Ambulatory Visit: Payer: Self-pay | Admitting: Family Medicine

## 2013-10-13 ENCOUNTER — Other Ambulatory Visit: Payer: Self-pay | Admitting: Family Medicine

## 2013-10-19 ENCOUNTER — Ambulatory Visit: Payer: Medicare Other | Admitting: Podiatrist

## 2013-10-26 ENCOUNTER — Ambulatory Visit (INDEPENDENT_AMBULATORY_CARE_PROVIDER_SITE_OTHER): Payer: Medicare Other | Admitting: Podiatrist

## 2013-10-26 ENCOUNTER — Other Ambulatory Visit: Payer: Self-pay | Admitting: Family Medicine

## 2013-10-26 DIAGNOSIS — B351 Tinea unguium: Secondary | ICD-10-CM

## 2013-10-26 DIAGNOSIS — M79676 Pain in unspecified toe(s): Secondary | ICD-10-CM

## 2013-10-27 NOTE — Progress Notes (Signed)
HPI: Patient presents today for follow up of foot and nail care. Denies any new complaints today. Presents with his interpreter "Ellie"  Objective: Patients chart is reviewed. Neurovascular status unchanged. Patients nails are thickened, discolored, distrophic, friable and brittle with yellow-brown discoloration. Patient subjectively relates they are painful with shoes and with ambulation of bilateral feet. Bilateral hallux nails severely thick and incurvated.  Assessment: Symptomatic onychomycosis  Plan: Discussed treatment options and alternatives. The symptomatic toenails were debrided through manual an mechanical means without complication. Return appointment recommended at routine intervals of 3 months  Marlowe AschoffKathryn Rourke Mcquitty, DPM

## 2013-10-28 ENCOUNTER — Encounter: Payer: Self-pay | Admitting: Family Medicine

## 2013-10-31 ENCOUNTER — Encounter: Payer: Self-pay | Admitting: Family Medicine

## 2013-10-31 NOTE — Telephone Encounter (Signed)
Have pt call his insurer and ask what diuretic medication is on there formulary b/c I don't know this information---it depends on every patient's specific insurance plan.  Have him write down a list of the ones they will allow and send these to us in a MyChart note and I'll choose one to replace his acetazolamide.--thx

## 2013-11-06 ENCOUNTER — Ambulatory Visit (INDEPENDENT_AMBULATORY_CARE_PROVIDER_SITE_OTHER): Payer: Medicare Other | Admitting: Family Medicine

## 2013-11-06 ENCOUNTER — Encounter: Payer: Self-pay | Admitting: Family Medicine

## 2013-11-06 VITALS — BP 114/69 | HR 57 | Temp 98.1°F | Resp 18 | Ht 70.0 in | Wt 212.0 lb

## 2013-11-06 DIAGNOSIS — K625 Hemorrhage of anus and rectum: Secondary | ICD-10-CM

## 2013-11-06 LAB — IBC PANEL
Iron: 104 ug/dL (ref 42–165)
Saturation Ratios: 38.2 % (ref 20.0–50.0)
TRANSFERRIN: 194.3 mg/dL — AB (ref 212.0–360.0)

## 2013-11-06 LAB — BASIC METABOLIC PANEL
BUN: 16 mg/dL (ref 6–23)
CO2: 21 meq/L (ref 19–32)
Calcium: 8.9 mg/dL (ref 8.4–10.5)
Chloride: 109 mEq/L (ref 96–112)
Creatinine, Ser: 1 mg/dL (ref 0.4–1.5)
GFR: 71.86 mL/min (ref >60.00)
GLUCOSE: 95 mg/dL (ref 70–99)
Potassium: 4.1 mEq/L (ref 3.5–5.1)
SODIUM: 137 meq/L (ref 135–145)

## 2013-11-06 LAB — CBC WITH DIFFERENTIAL/PLATELET
BASOS PCT: 0.3 % (ref 0.0–3.0)
Basophils Absolute: 0 10*3/uL (ref 0.0–0.1)
EOS PCT: 2.1 % (ref 0.0–5.0)
Eosinophils Absolute: 0.1 10*3/uL (ref 0.0–0.7)
HEMATOCRIT: 44.1 % (ref 39.0–52.0)
HEMOGLOBIN: 14.1 g/dL (ref 13.0–17.0)
LYMPHS ABS: 0.8 10*3/uL (ref 0.7–4.0)
Lymphocytes Relative: 12 % (ref 12.0–46.0)
MCHC: 32 g/dL (ref 30.0–36.0)
MCV: 96.4 fl (ref 78.0–100.0)
MONOS PCT: 6.6 % (ref 3.0–12.0)
Monocytes Absolute: 0.5 10*3/uL (ref 0.1–1.0)
Neutro Abs: 5.6 10*3/uL (ref 1.4–7.7)
Neutrophils Relative %: 79 % — ABNORMAL HIGH (ref 43.0–77.0)
Platelets: 170 10*3/uL (ref 150.0–400.0)
RBC: 4.58 Mil/uL (ref 4.22–5.81)
RDW: 13.8 % (ref 11.5–15.5)
WBC: 7.1 10*3/uL (ref 4.0–10.5)

## 2013-11-06 LAB — POCT HEMOGLOBIN: Hemoglobin: 14.3 g/dL (ref 14.1–18.1)

## 2013-11-06 LAB — FERRITIN: Ferritin: 221.1 ng/mL (ref 22.0–322.0)

## 2013-11-06 NOTE — Progress Notes (Signed)
OFFICE VISIT  11/06/2013   CC:  Chief Complaint  Patient presents with  . Blood In Stools    x 2 months   HPI:    Patient is a 80 y.o. Caucasian male who presents with interpreter for "blood in stool". Daily for 2 mo or so, sees "toilet water full of blood".   Currently mild pain in rectal area but this has not been a big issue--mostly painless bleeding.  Stool is easy to pass, usually formed but occ some diarrhea, color usually dark brown.  Sometimes black after he takes pepto bismol for upset stomach.  Says he has more fatigue/sleepiness than usual last 2 mo.  Past Medical History  Diagnosis Date  . Memory loss   . Hypertrophy of prostate without urinary obstruction and other lower urinary tract symptoms (LUTS)   . Arthropathy, unspecified, site unspecified   . Cerebrovascular disease, unspecified   . Other and unspecified hyperlipidemia   . Depressive disorder, not elsewhere classified   . Coronary atherosclerosis of unspecified type of vessel, native or graft     s/p stent placement "decades ago", hx of cath after this which showed patent stent per pt report.  Marland Kitchen. Hearing loss of both ears   . Urticarial dermatitis     Skin biopsy 11/2010 favored "urticarial allergic rxn related to localized hypersensitivity such as insect bite or arthropod assault"  . Venous stasis dermatitis, left 07/2007    Biopsy showed angiodermatitis, with all tests for infection NEG (fungal stain, AFB stain, and gram stain).  . Chronic venous insufficiency   . Lichen simplex chronicus 08/2011  . History of actinic keratoses 08/2011  . Osteoarthritis 06/2009    X-rays: both hips-mild  . Lumbar degenerative disc disease 06/2009    Mild osteoarthritic changes of L-spine diffusely on x-rays  . DEPRESSION 12/06/2006    Qualifier: Diagnosis of  By: Roxan Hockeyobinson CMA, Shanda BumpsJessica    . Chronic renal insufficiency, stage II (mild)   . Chronic rhinitis   . Cataract     cortical senile  . TRANSIENT ISCHEMIC ATTACK, HX OF  12/06/2006    Qualifier: Diagnosis of  By: Roxan Hockeyobinson CMA, Shanda BumpsJessica    . History of iron deficiency anemia 2013    Pt declined GI eval on multiple occasions    Past Surgical History  Procedure Laterality Date  . Appendectomy    . Coronary angioplasty with stent placement    . Tonsillectomy    . Cataract extraction  10/24/2007    right  . Cataract extraction  11/07/2007    left  Colonoscopy > 5 yrs ago-normal per pt recollection  Outpatient Prescriptions Prior to Visit  Medication Sig Dispense Refill  . acetaZOLAMIDE (DIAMOX) 250 MG tablet ALTERNATE 2 TABLETS BY MOUTH EVERY DAY WITH 1 TABLET BY MOUTH EVERY DAY 90 tablet 6  . diphenhydrAMINE (BENADRYL MAXIMUM STRENGTH) 2 % cream Apply topically 3 (three) times daily as needed.    . ferrous sulfate 325 (65 FE) MG tablet Take 325 mg by mouth 2 (two) times daily.    Marland Kitchen. FLUoxetine (PROZAC) 40 MG capsule TAKE ONE CAPSULE BY MOUTH EVERY DAY 30 capsule 0  . fluticasone (FLONASE) 50 MCG/ACT nasal spray 2 sprays in each nostril once daily 16 g 3  . metoprolol succinate (TOPROL-XL) 25 MG 24 hr tablet TAKE 1 TABLET BY MOUTH EVERY DAY 90 tablet 0  . Naproxen (NAPROSYN PO) Take by mouth as needed.    . pantoprazole (PROTONIX) 40 MG tablet Take 1 tablet (40  mg total) by mouth daily. 30 tablet 5  . Potassium Gluconate 550 MG TABS Take 1 tablet by mouth daily.    . pseudoephedrine-guaifenesin (MUCINEX D) 60-600 MG per tablet Take 1 tablet by mouth every 12 (twelve) hours.    . psyllium (METAMUCIL) 58.6 % packet Take 1 packet by mouth as needed.    . simvastatin (ZOCOR) 80 MG tablet TAKE 1 TABLET BY MOUTH AT BEDTIME 90 tablet 0  . tamsulosin (FLOMAX) 0.4 MG CAPS capsule TAKE ONE CAPSULE BY MOUTH DAILY 30 capsule 6  . temazepam (RESTORIL) 15 MG capsule 2 caps po qhs for insomnia 60 capsule 1  . traMADol (ULTRAM) 50 MG tablet Take 1-2 tablets (50-100 mg total) by mouth every 6 (six) hours as needed for pain. 30 tablet 3  . aspirin 81 MG tablet Take 160 mg by  mouth daily.    . clopidogrel (PLAVIX) 75 MG tablet TAKE 1 TABLET BY MOUTH EVERY DAY 90 tablet 0  . triamcinolone (KENALOG) 0.025 % cream Apply 2-3 times daily to skin eruptions, hives     No facility-administered medications prior to visit.    Allergies  Allergen Reactions  . Amoxicillin Rash  . Penicillins Rash    Severe skin problems    ROS As per HPI  PE: Blood pressure 114/69, pulse 57, temperature 98.1 F (36.7 C), temperature source Temporal, resp. rate 18, height 5\' 10"  (1.778 m), weight 212 lb (96.163 kg), SpO2 97 %. Gen: Alert, well appearing.  Patient is oriented to person, place, time, and situation. Question of subtle pallor of mucous membranes of yes/mouth. CV: RRR, no m/r/g Chest is clear, no wheezing or rales. Normal symmetric air entry throughout both lung fields. No chest wall deformities or tenderness. DRE: no anal fissure noted, no tenderness of peri-anal tissues, no signif ext hemorrhoids.  No mass or stool in rectal vault.  Stool wipings hemoccult positive.  LABS:  Capillary Hb today: 14.3 Hemoccult today: positive  IMPRESSION AND PLAN:  Painless rectal bleeding: Hb normal in office. Will check CBC with IBC panel + ferritin. Needs to see GI. Stop Aspirin and plavix for now, plan on restart of ASA only (no plavix) if antiplatelet med needed after he gets eval with GI--he requests [facility].  An After Visit Summary was printed and given to the patient.  FOLLOW UP: Return in about 2 months (around 01/06/2014) for rectal bleeding/?need to restart ASA.

## 2013-11-06 NOTE — Progress Notes (Signed)
Pre visit review using our clinic review tool, if applicable. No additional management support is needed unless otherwise documented below in the visit note. 

## 2013-11-06 NOTE — Patient Instructions (Signed)
STop aspirin and plavix and don't restart until you are told to do so.

## 2013-11-07 ENCOUNTER — Encounter: Payer: Self-pay | Admitting: Gastroenterology

## 2013-11-09 ENCOUNTER — Other Ambulatory Visit: Payer: Self-pay | Admitting: Gastroenterology

## 2013-11-09 DIAGNOSIS — IMO0001 Reserved for inherently not codable concepts without codable children: Secondary | ICD-10-CM

## 2013-11-09 DIAGNOSIS — R634 Abnormal weight loss: Secondary | ICD-10-CM

## 2013-11-14 ENCOUNTER — Other Ambulatory Visit: Payer: Medicare Other

## 2013-11-18 ENCOUNTER — Other Ambulatory Visit: Payer: Self-pay | Admitting: Family Medicine

## 2013-12-02 ENCOUNTER — Other Ambulatory Visit: Payer: Self-pay | Admitting: Family Medicine

## 2013-12-18 ENCOUNTER — Other Ambulatory Visit: Payer: Self-pay | Admitting: Family Medicine

## 2014-01-08 ENCOUNTER — Other Ambulatory Visit: Payer: Self-pay | Admitting: Family Medicine

## 2014-01-11 ENCOUNTER — Other Ambulatory Visit: Payer: Self-pay | Admitting: Family Medicine

## 2014-01-17 ENCOUNTER — Other Ambulatory Visit: Payer: Self-pay | Admitting: Family Medicine

## 2014-01-22 ENCOUNTER — Other Ambulatory Visit: Payer: Self-pay | Admitting: Family Medicine

## 2014-02-05 ENCOUNTER — Other Ambulatory Visit: Payer: Self-pay | Admitting: Family Medicine

## 2014-02-14 ENCOUNTER — Other Ambulatory Visit: Payer: Self-pay | Admitting: Family Medicine

## 2014-02-19 ENCOUNTER — Encounter: Payer: Self-pay | Admitting: Family Medicine

## 2014-02-20 ENCOUNTER — Encounter: Payer: Self-pay | Admitting: Family Medicine

## 2014-02-20 NOTE — Progress Notes (Deleted)
Pre visit review using our clinic review tool, if applicable. No additional management support is needed unless otherwise documented below in the visit note. 

## 2014-02-22 ENCOUNTER — Other Ambulatory Visit: Payer: Self-pay | Admitting: Family Medicine

## 2014-02-22 ENCOUNTER — Telehealth: Payer: Self-pay | Admitting: Family Medicine

## 2014-02-22 ENCOUNTER — Encounter: Payer: Self-pay | Admitting: Family Medicine

## 2014-02-22 MED ORDER — SIMVASTATIN 80 MG PO TABS: 80.0000 mg | ORAL_TABLET | Freq: Every day | ORAL | Status: DC

## 2014-02-22 NOTE — Telephone Encounter (Signed)
I'll rf x 43mo. Pt needs f/u (routine) sometime in the next 3 months.-thx

## 2014-02-22 NOTE — Telephone Encounter (Signed)
Jeff Hunt   To  Jeoffrey MassedPhilip H McGowen, MD   Sent  02/19/2014 10:30 AM      Please cancel my appointment for 10:00 AM 02/20/2014.  The heavy snow in my neighborhood, now to be followed by torrential rains tonight and tomorrow, make it too difficult for [place] to [place] [facility] because of my poor eyesight. I will have to reschedule the appointment.   [facility] notifying that my prescription for SIMVASTATIN 80MG  was ready to be picked up. But when I went to the pharmacy, I was told that it had not been refilled because Dr. Milinda CaveMcGowen had not yet authorized it. I have only a few tablets left. Should I stop taking it?    Dr. Milinda CaveMcGowen the above email was in my box and but needs your attention. Due to [name] hearing impairment please reply to him via My Chart.

## 2014-02-22 NOTE — Telephone Encounter (Signed)
Sent pt mychart message per patient request.

## 2014-02-22 NOTE — Telephone Encounter (Signed)
I will send patient email back with your thoughts.  I did not refill his medication because he was past due for his f/u.  Please advise.

## 2014-03-05 ENCOUNTER — Other Ambulatory Visit: Payer: Self-pay | Admitting: Family Medicine

## 2014-03-14 ENCOUNTER — Other Ambulatory Visit: Payer: Self-pay | Admitting: Family Medicine

## 2014-03-22 ENCOUNTER — Encounter: Payer: Self-pay | Admitting: Family Medicine

## 2014-03-23 ENCOUNTER — Other Ambulatory Visit: Payer: Self-pay | Admitting: Family Medicine

## 2014-03-23 MED ORDER — FLUOXETINE HCL 40 MG PO CAPS: ORAL_CAPSULE | ORAL | Status: DC

## 2014-04-04 ENCOUNTER — Other Ambulatory Visit: Payer: Self-pay | Admitting: Family Medicine

## 2014-04-14 ENCOUNTER — Encounter: Payer: Self-pay | Admitting: Family Medicine

## 2014-04-16 ENCOUNTER — Other Ambulatory Visit: Payer: Self-pay | Admitting: Family Medicine

## 2014-04-16 MED ORDER — TAMSULOSIN HCL 0.4 MG PO CAPS: 0.4000 mg | ORAL_CAPSULE | Freq: Every day | ORAL | Status: DC

## 2014-04-30 ENCOUNTER — Encounter: Payer: Self-pay | Admitting: Family Medicine

## 2014-04-30 ENCOUNTER — Ambulatory Visit (INDEPENDENT_AMBULATORY_CARE_PROVIDER_SITE_OTHER): Payer: Medicare Other | Admitting: Family Medicine

## 2014-04-30 VITALS — BP 120/66 | HR 55 | Temp 98.4°F | Resp 18 | Ht 70.0 in | Wt 200.0 lb

## 2014-04-30 DIAGNOSIS — K625 Hemorrhage of anus and rectum: Secondary | ICD-10-CM

## 2014-04-30 DIAGNOSIS — E785 Hyperlipidemia, unspecified: Secondary | ICD-10-CM | POA: Diagnosis not present

## 2014-04-30 DIAGNOSIS — I10 Essential (primary) hypertension: Secondary | ICD-10-CM | POA: Diagnosis not present

## 2014-04-30 DIAGNOSIS — R0781 Pleurodynia: Secondary | ICD-10-CM | POA: Diagnosis not present

## 2014-04-30 DIAGNOSIS — M898X1 Other specified disorders of bone, shoulder: Secondary | ICD-10-CM

## 2014-04-30 LAB — BASIC METABOLIC PANEL
BUN: 21 mg/dL (ref 6–23)
CO2: 22 mEq/L (ref 19–32)
CREATININE: 0.97 mg/dL (ref 0.40–1.50)
Calcium: 9.1 mg/dL (ref 8.4–10.5)
Chloride: 108 mEq/L (ref 96–112)
GFR: 77.79 mL/min (ref >60.00)
Glucose, Bld: 91 mg/dL (ref 70–99)
Potassium: 4.2 mEq/L (ref 3.5–5.1)
Sodium: 137 mEq/L (ref 135–145)

## 2014-04-30 LAB — CBC WITH DIFFERENTIAL/PLATELET
Basophils Absolute: 0 10*3/uL (ref 0.0–0.1)
Basophils Relative: 0.5 % (ref 0.0–3.0)
Eosinophils Absolute: 0.1 10*3/uL (ref 0.0–0.7)
Eosinophils Relative: 2 % (ref 0.0–5.0)
HEMATOCRIT: 42.1 % (ref 39.0–52.0)
Hemoglobin: 13.9 g/dL (ref 13.0–17.0)
LYMPHS ABS: 0.9 10*3/uL (ref 0.7–4.0)
Lymphocytes Relative: 14.6 % (ref 12.0–46.0)
MCHC: 33 g/dL (ref 30.0–36.0)
MCV: 96.3 fl (ref 78.0–100.0)
MONO ABS: 0.5 10*3/uL (ref 0.1–1.0)
Monocytes Relative: 8.3 % (ref 3.0–12.0)
NEUTROS ABS: 4.6 10*3/uL (ref 1.4–7.7)
Neutrophils Relative %: 74.6 % (ref 43.0–77.0)
Platelets: 167 10*3/uL (ref 150.0–400.0)
RBC: 4.37 Mil/uL (ref 4.22–5.81)
RDW: 14.2 % (ref 11.5–15.5)
WBC: 6.1 10*3/uL (ref 4.0–10.5)

## 2014-04-30 LAB — LIPID PANEL
CHOL/HDL RATIO: 3
CHOLESTEROL: 139 mg/dL (ref 0–200)
HDL: 44.9 mg/dL (ref >39.00)
LDL CALC: 74 mg/dL (ref 0–99)
NonHDL: 94.1
Triglycerides: 102 mg/dL (ref 0.0–149.0)
VLDL: 20.4 mg/dL (ref 0.0–40.0)

## 2014-04-30 MED ORDER — PANTOPRAZOLE SODIUM 40 MG PO TBEC: 40.0000 mg | DELAYED_RELEASE_TABLET | Freq: Every day | ORAL | Status: DC

## 2014-04-30 MED ORDER — METOPROLOL SUCCINATE ER 25 MG PO TB24: 25.0000 mg | ORAL_TABLET | Freq: Every day | ORAL | Status: DC

## 2014-04-30 NOTE — Progress Notes (Signed)
Pre visit review using our clinic review tool, if applicable. No additional management support is needed unless otherwise documented below in the visit note. 

## 2014-04-30 NOTE — Progress Notes (Signed)
OFFICE VISIT  04/30/2014   CC:  Follow up  HPI:    Patient is a 81 y.o. Caucasian male who presents for chronic illness/polypharmacy f/u.  I last saw him 5 mo ago. He is hearing impaired and uses an interpreter to help him read lips: between the two of them, the history-taking is extremely laborious and unreliable, confusing, etc.  The two talk amongst themselves at times in between my questions and then sometimes look back up at me and ask, "what was the question again".  R scapula area pain intermittently since a fall 2 mo ago: when reaching out or with sudden movement, sometimes when rolls over in bed: seems unpredictable.  Also seems to point to an area in R axillary region.  At times he says he is quite bothered by it, other times no pain.  Has tried no specific treatments for it.   The fall came after he overdid it with walking.  No CP, no head injury or LOC.  No focal weakness.  Complains of: "Easy bleeding": external hemorrhoids, some occ bleeding from nose, occ easy bruise.  His ROS is nearly globally positive and he made the statement: "I'm and old man and I'm falling apart slowly". Denies CP, denies SOB at rest.  No focal weakness or sensory loss.  No fevers.  Past Medical History  Diagnosis Date  . Memory loss   . BPH with obstruction/lower urinary tract symptoms   . Arthropathy, unspecified, site unspecified   . Cerebrovascular disease, unspecified   . Other and unspecified hyperlipidemia   . Depressive disorder, not elsewhere classified   . Coronary atherosclerosis of unspecified type of vessel, native or graft     s/p stent placement "decades ago", hx of cath after this which showed patent stent per pt report.  Marland Kitchen Hearing loss of both ears   . Urticarial dermatitis     Skin biopsy 11/2010 favored "urticarial allergic rxn related to localized hypersensitivity such as insect bite or arthropod assault"  . Venous stasis dermatitis, left 07/2007    Biopsy showed  angiodermatitis, with all tests for infection NEG (fungal stain, AFB stain, and gram stain).  . Chronic venous insufficiency   . Lichen simplex chronicus 08/2011  . History of actinic keratoses 08/2011  . Osteoarthritis 06/2009    X-rays: both hips-mild  . Lumbar degenerative disc disease 06/2009    Mild osteoarthritic changes of L-spine diffusely on x-rays  . DEPRESSION 12/06/2006    Qualifier: Diagnosis of  By: Roxan Hockey CMA, Shanda Bumps    . Chronic renal insufficiency, stage II (mild)   . Chronic rhinitis   . Cataract     cortical senile  . TRANSIENT ISCHEMIC ATTACK, HX OF 12/06/2006    Qualifier: Diagnosis of  By: Roxan Hockey CMA, Shanda Bumps    . History of iron deficiency anemia 2013    Pt declined GI eval on multiple occasions  . Rectal bleeding 2015    on/off x 3 mo; saw Dr. Dulce Sellar with [facility]; CT abd/pelvis + Anusol HC supp regimen rx'd.  It appears the CT was ordered but not done (as of 02/20/14)    Past Surgical History  Procedure Laterality Date  . Appendectomy    . Coronary angioplasty with stent placement    . Tonsillectomy    . Cataract extraction  10/24/2007    right  . Cataract extraction  11/07/2007    left    Outpatient Prescriptions Prior to Visit  Medication Sig Dispense Refill  . acetaZOLAMIDE (  DIAMOX) 250 MG tablet ALTERNATE 2 TABLETS BY MOUTH EVERY DAY WITH 1 TABLET BY MOUTH EVERY DAY 90 tablet 6  . diphenhydrAMINE (BENADRYL MAXIMUM STRENGTH) 2 % cream Apply topically 3 (three) times daily as needed.    . ferrous sulfate 325 (65 FE) MG tablet Take 325 mg by mouth 2 (two) times daily.    Marland Kitchen. FLUoxetine (PROZAC) 40 MG capsule 1 tab po qd 30 capsule 2  . fluticasone (FLONASE) 50 MCG/ACT nasal spray INSTILL 2 SPRAYS INTO EACH NOSTRIL DAILY 16 g 0  . Naproxen (NAPROSYN PO) Take by mouth as needed.    . Potassium Gluconate 550 MG TABS Take 1 tablet by mouth daily.    . psyllium (METAMUCIL) 58.6 % packet Take 1 packet by mouth as needed.    . simvastatin (ZOCOR) 80 MG tablet  Take 1 tablet (80 mg total) by mouth at bedtime. 30 tablet 2  . tamsulosin (FLOMAX) 0.4 MG CAPS capsule Take 1 capsule (0.4 mg total) by mouth daily. 30 capsule 6  . triamcinolone (KENALOG) 0.025 % cream Apply 2-3 times daily to skin eruptions, hives    . metoprolol succinate (TOPROL-XL) 25 MG 24 hr tablet TAKE 1 TABLET BY MOUTH EVERY DAY. 90 tablet 0  . pantoprazole (PROTONIX) 40 MG tablet TAKE 1 TABLET BY MOUTH EVERY DAY 30 tablet 0  . pseudoephedrine-guaifenesin (MUCINEX D) 60-600 MG per tablet Take 1 tablet by mouth every 12 (twelve) hours.    . temazepam (RESTORIL) 15 MG capsule 2 caps po qhs for insomnia (Patient not taking: Reported on 04/30/2014) 60 capsule 1  . traMADol (ULTRAM) 50 MG tablet Take 1-2 tablets (50-100 mg total) by mouth every 6 (six) hours as needed for pain. 30 tablet 3   No facility-administered medications prior to visit.    Allergies  Allergen Reactions  . Amoxicillin Rash  . Penicillins Rash    Severe skin problems    ROS As per HPI  PE: Blood pressure 120/66, pulse 55, temperature 98.4 F (36.9 C), temperature source Temporal, resp. rate 18, height 5\' 10"  (1.778 m), weight 200 lb (90.719 kg), SpO2 96 %. Gen: Alert, well appearing.  Patient is oriented to person, place, time, and situation.  Hard of hearing. Mouth: gingiva with scattered areas of erythema and swelling, dentition in disrepair. CV: RRR LUNGS: CTA bilat, nonlabored resps.  Chest expansion is symmetric, BS equal in all lung regions. BACK: nontender except for mild TTP in R scapula region in general, also mild TTp in R axillary chest wall region.  No deformity or significant focal tender point. SKIN: no pallor or jaundice or excessive bruising noted.  LABS:  Lab Results  Component Value Date   WBC 6.1 04/30/2014   HGB 13.9 04/30/2014   HCT 42.1 04/30/2014   MCV 96.3 04/30/2014   PLT 167.0 04/30/2014     Chemistry      Component Value Date/Time   NA 137 04/30/2014 1152   K 4.2  04/30/2014 1152   CL 108 04/30/2014 1152   CO2 22 04/30/2014 1152   BUN 21 04/30/2014 1152   CREATININE 0.97 04/30/2014 1152   CREATININE 1.65* 04/10/2011 1128      Component Value Date/Time   CALCIUM 9.1 04/30/2014 1152   ALKPHOS 60 08/15/2013 0913   AST 19 08/15/2013 0913   ALT 11 08/15/2013 0913   BILITOT 0.7 08/15/2013 0913     Lab Results  Component Value Date   IRON 104 11/06/2013   FERRITIN 221.1 11/06/2013  Lab Results  Component Value Date   CHOL 139 04/30/2014   HDL 44.90 04/30/2014   LDLCALC 74 04/30/2014   TRIG 102.0 04/30/2014   CHOLHDL 3 04/30/2014     IMPRESSION AND PLAN:  1) Pt c/o easy bleeding/bruising: I think he has gingivitis and this explains the majority of his complaint about bleeding.  I think his bruising is likely normal for an 81 y/o on ASA.  I said that if his CBC today was normal that I recommend he restart his ASA  qd (secondary prevention of CAD and Cerebrovascular dz).  2) Right scapula and R axillary chest wall pain s/p fall 2 mo ago: check x-rays of involved areas.  3) Hyperlipidemia: recheck FLP today.  4) HTN: bp normal.  Continue toprol XL.  5) GERD: continue pantoprazole  qd.  He has many ailments he wants to discuss but due to extreme difficulty in gathering history, he simply has to come in more often to cover things 1-2 problems at a time.  An After Visit Summary was printed and given to the patient.  FOLLOW UP: Return in about 1 month (around 05/30/2014) for chronic illness f/u (30 min).

## 2014-05-01 ENCOUNTER — Ambulatory Visit (INDEPENDENT_AMBULATORY_CARE_PROVIDER_SITE_OTHER)
Admission: RE | Admit: 2014-05-01 | Discharge: 2014-05-01 | Disposition: A | Discharge status: Home / Self Care | Payer: Medicare Other | Source: Ambulatory Visit | Attending: Family Medicine | Admitting: Family Medicine

## 2014-05-01 DIAGNOSIS — M898X1 Other specified disorders of bone, shoulder: Secondary | ICD-10-CM

## 2014-05-01 DIAGNOSIS — R0781 Pleurodynia: Secondary | ICD-10-CM

## 2014-05-01 DIAGNOSIS — M19011 Primary osteoarthritis, right shoulder: Secondary | ICD-10-CM | POA: Diagnosis not present

## 2014-05-10 ENCOUNTER — Telehealth: Payer: Self-pay | Admitting: Family Medicine

## 2014-05-10 NOTE — Telephone Encounter (Signed)
Noted. Agree.

## 2014-05-10 NOTE — Telephone Encounter (Signed)
Pt's interpreter called stating that pt is having increased chest pains.  He wanted Dr. Milinda CaveMcGowen to do further testing.  I advised patient's interpreter to go to ER.  Chest pains are not something that should wait.   Pt's interpreter voiced understanding and would advise pt to go to ER.

## 2014-05-11 ENCOUNTER — Ambulatory Visit: Payer: Self-pay

## 2014-05-13 ENCOUNTER — Other Ambulatory Visit: Payer: Self-pay | Admitting: Family Medicine

## 2014-05-14 NOTE — Telephone Encounter (Signed)
Please advise.  I don't know this medication.   Thanks!

## 2014-05-17 ENCOUNTER — Other Ambulatory Visit: Payer: Self-pay | Admitting: Family Medicine

## 2014-05-30 ENCOUNTER — Ambulatory Visit: Payer: Medicare Other | Admitting: Family Medicine

## 2014-06-13 ENCOUNTER — Other Ambulatory Visit: Payer: Self-pay | Admitting: Family Medicine

## 2014-06-13 ENCOUNTER — Other Ambulatory Visit: Payer: Self-pay | Admitting: *Deleted

## 2014-06-13 MED ORDER — FLUOXETINE HCL 40 MG PO CAPS: ORAL_CAPSULE | ORAL | Status: DC

## 2014-06-13 NOTE — Telephone Encounter (Signed)
[facility] Flu[facility]. LOV 04/30/14, no up coming ov, last written: 03/23/14 #30 w/ 2RF. Please advise. Thanks.

## 2014-07-23 DIAGNOSIS — D509 Iron deficiency anemia, unspecified: Secondary | ICD-10-CM | POA: Diagnosis not present

## 2014-07-23 DIAGNOSIS — H9110 Presbycusis, unspecified ear: Secondary | ICD-10-CM | POA: Diagnosis not present

## 2014-07-23 DIAGNOSIS — R110 Nausea: Secondary | ICD-10-CM | POA: Diagnosis not present

## 2014-07-23 DIAGNOSIS — F325 Major depressive disorder, single episode, in full remission: Secondary | ICD-10-CM | POA: Diagnosis not present

## 2014-08-22 DIAGNOSIS — F325 Major depressive disorder, single episode, in full remission: Secondary | ICD-10-CM | POA: Diagnosis not present

## 2014-08-22 DIAGNOSIS — R110 Nausea: Secondary | ICD-10-CM | POA: Diagnosis not present

## 2014-08-22 DIAGNOSIS — H9110 Presbycusis, unspecified ear: Secondary | ICD-10-CM | POA: Diagnosis not present

## 2014-08-22 DIAGNOSIS — Z79899 Other long term (current) drug therapy: Secondary | ICD-10-CM | POA: Diagnosis not present

## 2014-08-22 DIAGNOSIS — D692 Other nonthrombocytopenic purpura: Secondary | ICD-10-CM | POA: Diagnosis not present

## 2014-08-22 DIAGNOSIS — D509 Iron deficiency anemia, unspecified: Secondary | ICD-10-CM | POA: Diagnosis not present

## 2014-10-24 ENCOUNTER — Other Ambulatory Visit: Payer: Self-pay | Admitting: Geriatric Medicine

## 2014-10-24 DIAGNOSIS — R609 Edema, unspecified: Secondary | ICD-10-CM

## 2014-10-24 DIAGNOSIS — R600 Localized edema: Secondary | ICD-10-CM | POA: Diagnosis not present

## 2014-10-24 DIAGNOSIS — R0600 Dyspnea, unspecified: Secondary | ICD-10-CM | POA: Diagnosis not present

## 2014-10-24 DIAGNOSIS — Z23 Encounter for immunization: Secondary | ICD-10-CM | POA: Diagnosis not present

## 2014-10-26 ENCOUNTER — Ambulatory Visit
Admission: RE | Admit: 2014-10-26 | Discharge: 2014-10-26 | Disposition: A | Discharge status: Home / Self Care | Payer: Medicare Other | Source: Ambulatory Visit | Attending: Geriatric Medicine | Admitting: Geriatric Medicine

## 2014-10-26 DIAGNOSIS — R609 Edema, unspecified: Secondary | ICD-10-CM

## 2014-10-26 DIAGNOSIS — R600 Localized edema: Secondary | ICD-10-CM | POA: Diagnosis not present

## 2014-10-29 ENCOUNTER — Other Ambulatory Visit: Payer: Self-pay | Admitting: Family Medicine

## 2014-11-17 ENCOUNTER — Other Ambulatory Visit: Payer: Self-pay | Admitting: Family Medicine

## 2014-11-19 NOTE — Telephone Encounter (Signed)
RF request for tamsulosin LOV: 04/30/14 Next ov: None Last written: 04/16/14 #30 w/ 6RF

## 2014-12-07 DIAGNOSIS — I8310 Varicose veins of unspecified lower extremity with inflammation: Secondary | ICD-10-CM | POA: Diagnosis not present

## 2014-12-07 DIAGNOSIS — N3281 Overactive bladder: Secondary | ICD-10-CM | POA: Diagnosis not present

## 2014-12-07 DIAGNOSIS — I10 Essential (primary) hypertension: Secondary | ICD-10-CM | POA: Diagnosis not present

## 2015-04-09 DIAGNOSIS — I8310 Varicose veins of unspecified lower extremity with inflammation: Secondary | ICD-10-CM | POA: Diagnosis not present

## 2015-04-09 DIAGNOSIS — I10 Essential (primary) hypertension: Secondary | ICD-10-CM | POA: Diagnosis not present

## 2015-04-09 DIAGNOSIS — J300 Vasomotor rhinitis: Secondary | ICD-10-CM | POA: Diagnosis not present

## 2015-04-19 ENCOUNTER — Other Ambulatory Visit: Payer: Self-pay | Admitting: Family Medicine

## 2015-05-01 ENCOUNTER — Other Ambulatory Visit: Payer: Self-pay | Admitting: Family Medicine

## 2015-06-18 DIAGNOSIS — R21 Rash and other nonspecific skin eruption: Secondary | ICD-10-CM | POA: Diagnosis not present

## 2015-06-18 DIAGNOSIS — B351 Tinea unguium: Secondary | ICD-10-CM | POA: Diagnosis not present

## 2015-06-26 ENCOUNTER — Encounter: Payer: Self-pay | Admitting: Podiatry

## 2015-06-26 ENCOUNTER — Ambulatory Visit (INDEPENDENT_AMBULATORY_CARE_PROVIDER_SITE_OTHER): Payer: Medicare Other | Admitting: Podiatry

## 2015-06-26 DIAGNOSIS — B351 Tinea unguium: Secondary | ICD-10-CM | POA: Diagnosis not present

## 2015-06-26 DIAGNOSIS — M79676 Pain in unspecified toe(s): Secondary | ICD-10-CM | POA: Diagnosis not present

## 2015-06-27 NOTE — Progress Notes (Signed)
Subjective:     Patient ID: Jeff Hunt, male   DOB: 03/13/1925, 82 y.o.   MRN: [id number]  HPI patient presents with damaged left hallux nail and thickened nails 1-5 both feet the become painful and makes shoe gear difficult. Has a caregiver who also cannot take care of these nails   Review of Systems     Objective:   Physical Exam Neurovascular status found to be intact with patient found to have a damaged left hallux nail that's loose and nail disease 1-5 both feet with thick subungual debris and pain    Assessment:     Mycotic nail infection 1-5 both feet with pain with damaged left hallux nailbed that's loose    Plan:     Debrided all nailbeds at this time which was tolerated well and gave instructions on continued trimming and other techniques to keep under control. No drainage was noted

## 2015-07-17 DIAGNOSIS — M79674 Pain in right toe(s): Secondary | ICD-10-CM | POA: Diagnosis not present

## 2015-07-17 DIAGNOSIS — L602 Onychogryphosis: Secondary | ICD-10-CM | POA: Diagnosis not present

## 2015-07-17 DIAGNOSIS — M79675 Pain in left toe(s): Secondary | ICD-10-CM | POA: Diagnosis not present

## 2015-07-17 DIAGNOSIS — B351 Tinea unguium: Secondary | ICD-10-CM | POA: Diagnosis not present

## 2015-08-07 DIAGNOSIS — M79672 Pain in left foot: Secondary | ICD-10-CM | POA: Diagnosis not present

## 2015-08-07 DIAGNOSIS — L602 Onychogryphosis: Secondary | ICD-10-CM | POA: Diagnosis not present

## 2015-08-07 DIAGNOSIS — M79671 Pain in right foot: Secondary | ICD-10-CM | POA: Diagnosis not present

## 2015-08-23 ENCOUNTER — Inpatient Hospital Stay (HOSPITAL_COMMUNITY)
Admission: EM | Admit: 2015-08-23 | Discharge: 2015-08-30 | DRG: 469 | Disposition: A | Discharge status: Skilled Nursing Facility | Payer: Medicare Other | Attending: Internal Medicine | Admitting: Internal Medicine

## 2015-08-23 ENCOUNTER — Emergency Department (HOSPITAL_COMMUNITY): Payer: Medicare Other

## 2015-08-23 ENCOUNTER — Encounter (HOSPITAL_COMMUNITY): Payer: Self-pay | Admitting: Emergency Medicine

## 2015-08-23 ENCOUNTER — Inpatient Hospital Stay (HOSPITAL_COMMUNITY): Payer: Medicare Other

## 2015-08-23 DIAGNOSIS — H9193 Unspecified hearing loss, bilateral: Secondary | ICD-10-CM | POA: Diagnosis not present

## 2015-08-23 DIAGNOSIS — I2583 Coronary atherosclerosis due to lipid rich plaque: Secondary | ICD-10-CM

## 2015-08-23 DIAGNOSIS — I739 Peripheral vascular disease, unspecified: Secondary | ICD-10-CM | POA: Diagnosis not present

## 2015-08-23 DIAGNOSIS — F329 Major depressive disorder, single episode, unspecified: Secondary | ICD-10-CM | POA: Diagnosis present

## 2015-08-23 DIAGNOSIS — I10 Essential (primary) hypertension: Secondary | ICD-10-CM | POA: Diagnosis not present

## 2015-08-23 DIAGNOSIS — J310 Chronic rhinitis: Secondary | ICD-10-CM | POA: Diagnosis not present

## 2015-08-23 DIAGNOSIS — S72011A Unspecified intracapsular fracture of right femur, initial encounter for closed fracture: Principal | ICD-10-CM | POA: Diagnosis present

## 2015-08-23 DIAGNOSIS — H918X3 Other specified hearing loss, bilateral: Secondary | ICD-10-CM | POA: Diagnosis not present

## 2015-08-23 DIAGNOSIS — I4892 Unspecified atrial flutter: Secondary | ICD-10-CM | POA: Diagnosis present

## 2015-08-23 DIAGNOSIS — I129 Hypertensive chronic kidney disease with stage 1 through stage 4 chronic kidney disease, or unspecified chronic kidney disease: Secondary | ICD-10-CM | POA: Diagnosis present

## 2015-08-23 DIAGNOSIS — W1830XA Fall on same level, unspecified, initial encounter: Secondary | ICD-10-CM | POA: Diagnosis present

## 2015-08-23 DIAGNOSIS — Z471 Aftercare following joint replacement surgery: Secondary | ICD-10-CM | POA: Diagnosis not present

## 2015-08-23 DIAGNOSIS — I959 Hypotension, unspecified: Secondary | ICD-10-CM | POA: Diagnosis not present

## 2015-08-23 DIAGNOSIS — E785 Hyperlipidemia, unspecified: Secondary | ICD-10-CM | POA: Diagnosis present

## 2015-08-23 DIAGNOSIS — S50311A Abrasion of right elbow, initial encounter: Secondary | ICD-10-CM | POA: Diagnosis present

## 2015-08-23 DIAGNOSIS — G9341 Metabolic encephalopathy: Secondary | ICD-10-CM | POA: Diagnosis present

## 2015-08-23 DIAGNOSIS — M25521 Pain in right elbow: Secondary | ICD-10-CM | POA: Diagnosis not present

## 2015-08-23 DIAGNOSIS — Y92009 Unspecified place in unspecified non-institutional (private) residence as the place of occurrence of the external cause: Secondary | ICD-10-CM | POA: Diagnosis not present

## 2015-08-23 DIAGNOSIS — K219 Gastro-esophageal reflux disease without esophagitis: Secondary | ICD-10-CM | POA: Diagnosis present

## 2015-08-23 DIAGNOSIS — R609 Edema, unspecified: Secondary | ICD-10-CM | POA: Diagnosis not present

## 2015-08-23 DIAGNOSIS — R339 Retention of urine, unspecified: Secondary | ICD-10-CM | POA: Diagnosis not present

## 2015-08-23 DIAGNOSIS — I440 Atrioventricular block, first degree: Secondary | ICD-10-CM | POA: Diagnosis present

## 2015-08-23 DIAGNOSIS — I872 Venous insufficiency (chronic) (peripheral): Secondary | ICD-10-CM | POA: Diagnosis not present

## 2015-08-23 DIAGNOSIS — R338 Other retention of urine: Secondary | ICD-10-CM

## 2015-08-23 DIAGNOSIS — R600 Localized edema: Secondary | ICD-10-CM | POA: Diagnosis present

## 2015-08-23 DIAGNOSIS — L89152 Pressure ulcer of sacral region, stage 2: Secondary | ICD-10-CM | POA: Diagnosis not present

## 2015-08-23 DIAGNOSIS — M6281 Muscle weakness (generalized): Secondary | ICD-10-CM | POA: Diagnosis not present

## 2015-08-23 DIAGNOSIS — I4891 Unspecified atrial fibrillation: Secondary | ICD-10-CM | POA: Diagnosis not present

## 2015-08-23 DIAGNOSIS — N138 Other obstructive and reflux uropathy: Secondary | ICD-10-CM | POA: Diagnosis present

## 2015-08-23 DIAGNOSIS — I481 Persistent atrial fibrillation: Secondary | ICD-10-CM | POA: Diagnosis not present

## 2015-08-23 DIAGNOSIS — I484 Atypical atrial flutter: Secondary | ICD-10-CM | POA: Diagnosis not present

## 2015-08-23 DIAGNOSIS — N401 Enlarged prostate with lower urinary tract symptoms: Secondary | ICD-10-CM | POA: Diagnosis not present

## 2015-08-23 DIAGNOSIS — D509 Iron deficiency anemia, unspecified: Secondary | ICD-10-CM | POA: Diagnosis not present

## 2015-08-23 DIAGNOSIS — I2510 Atherosclerotic heart disease of native coronary artery without angina pectoris: Secondary | ICD-10-CM | POA: Diagnosis not present

## 2015-08-23 DIAGNOSIS — Z9181 History of falling: Secondary | ICD-10-CM | POA: Diagnosis not present

## 2015-08-23 DIAGNOSIS — S72001A Fracture of unspecified part of neck of right femur, initial encounter for closed fracture: Secondary | ICD-10-CM | POA: Diagnosis not present

## 2015-08-23 DIAGNOSIS — M1990 Unspecified osteoarthritis, unspecified site: Secondary | ICD-10-CM | POA: Diagnosis not present

## 2015-08-23 DIAGNOSIS — Z880 Allergy status to penicillin: Secondary | ICD-10-CM

## 2015-08-23 DIAGNOSIS — M25551 Pain in right hip: Secondary | ICD-10-CM | POA: Diagnosis present

## 2015-08-23 DIAGNOSIS — G459 Transient cerebral ischemic attack, unspecified: Secondary | ICD-10-CM | POA: Diagnosis not present

## 2015-08-23 DIAGNOSIS — I480 Paroxysmal atrial fibrillation: Secondary | ICD-10-CM | POA: Diagnosis not present

## 2015-08-23 DIAGNOSIS — S72009A Fracture of unspecified part of neck of unspecified femur, initial encounter for closed fracture: Secondary | ICD-10-CM | POA: Diagnosis present

## 2015-08-23 DIAGNOSIS — T148 Other injury of unspecified body region: Secondary | ICD-10-CM | POA: Diagnosis not present

## 2015-08-23 DIAGNOSIS — T148XXA Other injury of unspecified body region, initial encounter: Secondary | ICD-10-CM

## 2015-08-23 DIAGNOSIS — Z881 Allergy status to other antibiotic agents status: Secondary | ICD-10-CM

## 2015-08-23 DIAGNOSIS — Z79899 Other long term (current) drug therapy: Secondary | ICD-10-CM

## 2015-08-23 DIAGNOSIS — L8990 Pressure ulcer of unspecified site, unspecified stage: Secondary | ICD-10-CM | POA: Insufficient documentation

## 2015-08-23 DIAGNOSIS — Z955 Presence of coronary angioplasty implant and graft: Secondary | ICD-10-CM

## 2015-08-23 DIAGNOSIS — W19XXXA Unspecified fall, initial encounter: Secondary | ICD-10-CM

## 2015-08-23 DIAGNOSIS — Z8673 Personal history of transient ischemic attack (TIA), and cerebral infarction without residual deficits: Secondary | ICD-10-CM

## 2015-08-23 DIAGNOSIS — K210 Gastro-esophageal reflux disease with esophagitis: Secondary | ICD-10-CM | POA: Diagnosis not present

## 2015-08-23 DIAGNOSIS — N182 Chronic kidney disease, stage 2 (mild): Secondary | ICD-10-CM | POA: Diagnosis present

## 2015-08-23 DIAGNOSIS — R1312 Dysphagia, oropharyngeal phase: Secondary | ICD-10-CM | POA: Diagnosis not present

## 2015-08-23 DIAGNOSIS — Z87891 Personal history of nicotine dependence: Secondary | ICD-10-CM

## 2015-08-23 DIAGNOSIS — L89011 Pressure ulcer of right elbow, stage 1: Secondary | ICD-10-CM | POA: Diagnosis not present

## 2015-08-23 DIAGNOSIS — R0781 Pleurodynia: Secondary | ICD-10-CM | POA: Diagnosis not present

## 2015-08-23 DIAGNOSIS — S299XXA Unspecified injury of thorax, initial encounter: Secondary | ICD-10-CM | POA: Diagnosis not present

## 2015-08-23 DIAGNOSIS — Z96641 Presence of right artificial hip joint: Secondary | ICD-10-CM | POA: Diagnosis not present

## 2015-08-23 DIAGNOSIS — Z8249 Family history of ischemic heart disease and other diseases of the circulatory system: Secondary | ICD-10-CM

## 2015-08-23 DIAGNOSIS — S72011D Unspecified intracapsular fracture of right femur, subsequent encounter for closed fracture with routine healing: Secondary | ICD-10-CM | POA: Diagnosis not present

## 2015-08-23 LAB — CBC WITH DIFFERENTIAL/PLATELET
Basophils Absolute: 0 10*3/uL (ref 0.0–0.1)
Basophils Relative: 0 %
EOS ABS: 0.1 10*3/uL (ref 0.0–0.7)
Eosinophils Relative: 1 %
HCT: 44.5 % (ref 39.0–52.0)
HEMOGLOBIN: 14.1 g/dL (ref 13.0–17.0)
LYMPHS ABS: 0.7 10*3/uL (ref 0.7–4.0)
LYMPHS PCT: 8 %
MCH: 31.4 pg (ref 26.0–34.0)
MCHC: 31.7 g/dL (ref 30.0–36.0)
MCV: 99.1 fL (ref 78.0–100.0)
Monocytes Absolute: 0.7 10*3/uL (ref 0.1–1.0)
Monocytes Relative: 7 %
NEUTROS PCT: 84 %
Neutro Abs: 7.8 10*3/uL — ABNORMAL HIGH (ref 1.7–7.7)
Platelets: 162 10*3/uL (ref 150–400)
RBC: 4.49 MIL/uL (ref 4.22–5.81)
RDW: 13.2 % (ref 11.5–15.5)
WBC: 9.3 10*3/uL (ref 4.0–10.5)

## 2015-08-23 LAB — COMPREHENSIVE METABOLIC PANEL
ALK PHOS: 76 U/L (ref 38–126)
ALT: 22 U/L (ref 17–63)
AST: 27 U/L (ref 15–41)
Albumin: 4 g/dL (ref 3.5–5.0)
Anion gap: 7 (ref 5–15)
BUN: 15 mg/dL (ref 6–20)
CALCIUM: 9.1 mg/dL (ref 8.9–10.3)
CO2: 20 mmol/L — AB (ref 22–32)
CREATININE: 0.98 mg/dL (ref 0.61–1.24)
Chloride: 112 mmol/L — ABNORMAL HIGH (ref 101–111)
GFR calc non Af Amer: >60 mL/min (ref >60)
Glucose, Bld: 116 mg/dL — ABNORMAL HIGH (ref 65–99)
Potassium: 4 mmol/L (ref 3.5–5.1)
SODIUM: 139 mmol/L (ref 135–145)
Total Bilirubin: 0.4 mg/dL (ref 0.3–1.2)
Total Protein: 6.9 g/dL (ref 6.5–8.1)

## 2015-08-23 LAB — TYPE AND SCREEN
ABO/RH(D): O POS
ANTIBODY SCREEN: NEGATIVE

## 2015-08-23 LAB — PROTIME-INR
INR: 1.07
Prothrombin Time: 13.9 seconds (ref 11.4–15.2)

## 2015-08-23 LAB — BRAIN NATRIURETIC PEPTIDE: B Natriuretic Peptide: 96 pg/mL (ref 0.0–100.0)

## 2015-08-23 LAB — APTT: aPTT: 34 seconds (ref 24–36)

## 2015-08-23 LAB — TROPONIN I

## 2015-08-23 MED ORDER — HYDROCODONE-ACETAMINOPHEN 5-325 MG PO TABS
1.0000 | ORAL_TABLET | Freq: Four times a day (QID) | ORAL | Status: DC | PRN
  Administered 2015-08-26 – 2015-08-28 (×3): 1 via ORAL
  Filled 2015-08-23 (×3): qty 1

## 2015-08-23 MED ORDER — FLUTICASONE PROPIONATE 50 MCG/ACT NA SUSP
1.0000 | Freq: Every day | NASAL | Status: DC
  Administered 2015-08-26 – 2015-08-30 (×4): 1 via NASAL
  Filled 2015-08-23 (×2): qty 16

## 2015-08-23 MED ORDER — TAMSULOSIN HCL 0.4 MG PO CAPS
0.4000 mg | ORAL_CAPSULE | Freq: Every day | ORAL | Status: DC
  Administered 2015-08-23 – 2015-08-30 (×8): 0.4 mg via ORAL
  Filled 2015-08-23 (×8): qty 1

## 2015-08-23 MED ORDER — ACETAZOLAMIDE 250 MG PO TABS
250.0000 mg | ORAL_TABLET | Freq: Every day | ORAL | Status: DC
  Filled 2015-08-23: qty 1

## 2015-08-23 MED ORDER — ATORVASTATIN CALCIUM 40 MG PO TABS
40.0000 mg | ORAL_TABLET | Freq: Every day | ORAL | Status: DC
  Administered 2015-08-25 – 2015-08-29 (×5): 40 mg via ORAL
  Filled 2015-08-23 (×5): qty 1

## 2015-08-23 MED ORDER — PANTOPRAZOLE SODIUM 40 MG PO TBEC
40.0000 mg | DELAYED_RELEASE_TABLET | Freq: Every day | ORAL | Status: DC
  Administered 2015-08-23 – 2015-08-30 (×8): 40 mg via ORAL
  Filled 2015-08-23 (×8): qty 1

## 2015-08-23 MED ORDER — FLUOXETINE HCL 40 MG PO CAPS: 40.0000 mg | ORAL_CAPSULE | Freq: Every day | ORAL | Status: DC

## 2015-08-23 MED ORDER — FERROUS SULFATE 325 (65 FE) MG PO TABS
325.0000 mg | ORAL_TABLET | Freq: Two times a day (BID) | ORAL | Status: DC
  Administered 2015-08-23 – 2015-08-30 (×14): 325 mg via ORAL
  Filled 2015-08-23 (×14): qty 1

## 2015-08-23 MED ORDER — PSYLLIUM 95 % PO PACK
1.0000 | PACK | Freq: Every day | ORAL | Status: DC | PRN
Start: 2015-08-23 — End: 2015-08-30
  Filled 2015-08-23: qty 1

## 2015-08-23 MED ORDER — METOPROLOL SUCCINATE ER 25 MG PO TB24
25.0000 mg | ORAL_TABLET | Freq: Every day | ORAL | Status: DC
  Administered 2015-08-24 – 2015-08-29 (×6): 25 mg via ORAL
  Filled 2015-08-23 (×6): qty 1

## 2015-08-23 MED ORDER — MORPHINE SULFATE (PF) 4 MG/ML IV SOLN
4.0000 mg | Freq: Once | INTRAVENOUS | Status: AC
  Administered 2015-08-23: 4 mg via INTRAVENOUS
  Filled 2015-08-23: qty 1

## 2015-08-23 MED ORDER — MORPHINE SULFATE (PF) 2 MG/ML IV SOLN
0.5000 mg | INTRAVENOUS | Status: DC | PRN
  Administered 2015-08-24 – 2015-08-25 (×4): 0.5 mg via INTRAVENOUS
  Filled 2015-08-23 (×4): qty 1

## 2015-08-23 NOTE — ED Triage Notes (Signed)
Pt here after tripping and falling taking  Out the trash no loc , pt is c/o right hip pain and right elbow pain ,

## 2015-08-23 NOTE — ED Notes (Signed)
Cleaned abrasion on right elbow with normal saline, applied telfa pad with kerlex bandaging around the elbow.  Patient tolerated it well.

## 2015-08-23 NOTE — H&P (Signed)
History and Physical    Jeff Hunt ZHY:[id number] DOB: 05/04/1925 DOA: 08/23/2015  PCP: Ginette OttoSTONEKING,HAL THOMAS, MD   Patient coming from: Home  Chief Complaint: Fall, right hip pain  HPI: Jeff Maxinlbert C Predmore is a 82 y.o. gentleman with a history of CAD (remote, family reports that he has not seen a cardiologist in over five years), TIA, CKD 2, iron deficiency anemia, and BPH who presents to the ED tonight after having a mechanical fall at home.  The patient remembers the fall and denies loss of consciousness.  Reports chronic intermittent blurred vision and dizziness ("for years") but says that those symptoms were actually better today.  He also reports chronic lower extremity edema (on acetazolamide at home).  No unexplained chest pain or shortness of breath in the past 4-6 weeks.  No known history of CHF.  No headache.  No recent antibiotics in the past 30 days.  No dysuria.  No unexplained fevers.  No recent hospitalizations.  ED Course: The patient has been diagnosed with a right subcapital femoral neck fracture with increased angulation.  He is also in rate controlled atrial fibrillation, which is a new diagnosis for him.  Review of Systems: As per HPI otherwise 10 point review of systems negative.    Past Medical History:  Diagnosis Date  . Arthropathy, unspecified, site unspecified   . BPH with obstruction/lower urinary tract symptoms   . Cataract    cortical senile  . Cerebrovascular disease, unspecified   . Chronic renal insufficiency, stage II (mild)   . Chronic rhinitis   . Chronic venous insufficiency   . Coronary atherosclerosis of unspecified type of vessel, native or graft    s/p stent placement "decades ago", hx of cath after this which showed patent stent per pt report.  Marland Kitchen. DEPRESSION 12/06/2006   Qualifier: Diagnosis of  By: Roxan Hockeyobinson CMA, Shanda BumpsJessica    . Depressive disorder, not elsewhere classified   . Hearing loss of both ears   . History of actinic keratoses 08/2011    . History of iron deficiency anemia 2013   Pt declined GI eval on multiple occasions  . Lichen simplex chronicus 08/2011  . Lumbar degenerative disc disease 06/2009   Mild osteoarthritic changes of L-spine diffusely on x-rays  . Memory loss   . Osteoarthritis 06/2009   X-rays: both hips-mild  . Other and unspecified hyperlipidemia   . Rectal bleeding 2015   on/off x 3 mo; [facility]; CT abd/pelvis + Anusol HC supp regimen rx'd.  It appears the CT was ordered but not done (as of 02/20/14)  . TRANSIENT ISCHEMIC ATTACK, HX OF 12/06/2006   Qualifier: Diagnosis of  By: Roxan Hockeyobinson CMA, Shanda BumpsJessica    . Urticarial dermatitis    Skin biopsy 11/2010 favored "urticarial allergic rxn related to localized hypersensitivity such as insect bite or arthropod assault"  . Venous stasis dermatitis, left 07/2007   Biopsy showed angiodermatitis, with all tests for infection NEG (fungal stain, AFB stain, and gram stain).    Past Surgical History:  Procedure Laterality Date  . APPENDECTOMY    . CATARACT EXTRACTION  10/24/2007   right  . CATARACT EXTRACTION  11/07/2007   left  . CORONARY ANGIOPLASTY WITH STENT PLACEMENT    . TONSILLECTOMY       reports that he has quit smoking. His smoking use included Pipe. He has never used smokeless tobacco. He reports that he drinks about 10.5 oz of alcohol per week . He reports that  he does not use drugs.  Surrogate daughter denies any known history of EtOH withdrawal. He is married.  He has several biological children and several "adopted".  Allergies  Allergen Reactions  . Amoxicillin Rash  . Penicillins Rash    Severe skin problems    Family History  Problem Relation Age of Onset  . Heart disease Father 28  . Hypertension Mother   . Dementia Sister   . Colon cancer Neg Hx      Prior to Admission medications   Medication Sig Start Date End Date Taking? Authorizing Provider  acetaZOLAMIDE (DIAMOX) 250 MG tablet ALTERNATE 2 TABLETS BY MOUTH  EVERY DAY WITH 1 TABLET BY MOUTH EVERY DAY. 05/14/14   [name], MD  diphenhydrAMINE (BENADRYL MAXIMUM STRENGTH) 2 % cream Apply topically 3 (three) times daily as needed.    Historical Provider, MD  ferrous sulfate 325 (65 FE) MG tablet Take 325 mg by mouth 2 (two) times daily.    Historical Provider, MD  FLUoxetine (PROZAC) 40 MG capsule 1 tab po qd 06/13/14   [name], MD  fluticasone [facility]) 50 MCG/ACT nasal spray INSTILL 2 SPRAYS INTO EACH NOSTRIL DAILY 01/11/14   [name], MD  metoprolol succinate (TOPROL-XL) 25 MG 24 hr tablet Take 1 tablet (25 mg total) by mouth daily. 04/30/14   [name], MD  Naproxen (NAPROSYN PO) Take by mouth as needed.    Historical Provider, MD  pantoprazole (PROTONIX) 40 MG tablet Take 1 tablet (40 mg total) by mouth daily. 04/30/14   [name], MD  Potassium Gluconate 550 MG TABS Take 1 tablet by mouth daily.    Historical Provider, MD  psyllium (METAMUCIL) 58.6 % packet Take 1 packet by mouth as needed.    Historical Provider, MD  simvastatin (ZOCOR) 80 MG tablet TAKE 1 TABLET BY MOUTH AT BEDTIME 06/13/14   [name], MD  tamsulosin (FLOMAX) 0.4 MG CAPS capsule Take 1 capsule (0.4 mg total) by mouth daily. 04/16/14   [name], MD  triamcinolone (KENALOG) 0.025 % cream Apply 2-3 times daily to skin eruptions, hives    Historical Provider, MD    Physical Exam: Vitals:   08/23/15 1730 08/23/15 1803 08/23/15 1928 08/23/15 1945  BP: 119/80 105/67 133/95 137/85  Pulse: 72 89 67 (!) 54  Resp: 14  18 22   Temp: 98.3 F (36.8 C)  97.7 F (36.5 C)   TempSrc: Oral  Oral   SpO2: 93% 92% 94% 94%  Weight:   81.6 kg (180 lb)   Height:   5\' 11"  (1.803 m)       Constitutional: NAD, calm, comfortable.  Extremely hard of hearing.   Vitals:   08/23/15 1730 08/23/15 1803 08/23/15 1928 08/23/15 1945  BP: 119/80 105/67 133/95 137/85  Pulse: 72 89 67 (!) 54  Resp: 14  18 22   Temp: 98.3 F (36.8 C)  97.7 F (36.5 C)     TempSrc: Oral  Oral   SpO2: 93% 92% 94% 94%  Weight:   81.6 kg (180 lb)   Height:   5\' 11"  (1.803 m)    Eyes: PERRL, lids and conjunctivae normal ENMT: Mucous membranes are moist. Posterior pharynx clear of any exudate or lesions. Normal dentition.  Neck: normal appearance, supple Respiratory: clear to auscultation listening anteriorly.  Normal respiratory effort. No accessory muscle use.  Cardiovascular: Irregular but rate controlled.  + 1-2+ bilateral lower extremity edema. 2+ pedal pulses.  GI: abdomen  is soft and compressible.  No distention.  No tenderness.  No masses palpated.  Bowel sounds are present. Musculoskeletal:  No joint deformity in upper and lower extremities. Good ROM, no contractures. Normal muscle tone.  Skin: no rashes, warm and dry Neurologic: No focal deficits. Psychiatric: Normal judgment and insight. Alert and oriented x 3. Normal mood.     Labs on Admission: I have personally reviewed following labs and imaging studies  CBC:  Recent Labs Lab 08/23/15 2013  WBC 9.3  NEUTROABS 7.8*  HGB 14.1  HCT 44.5  MCV 99.1  PLT 162   CMP pending  Radiological Exams on Admission: Dg Elbow Complete Right (3+view)  Result Date: 08/23/2015 CLINICAL DATA:  Fall today. Right hip fracture. Abrasion to the right elbow. EXAM: RIGHT ELBOW - COMPLETE 3+ VIEW COMPARISON:  None. FINDINGS: Right elbow is located. There is no significant effusion. No acute fracture is present. Soft tissue swelling is present posterior to the olecranon. IMPRESSION: Soft tissue swelling posterior to the elbow without underlying fracture. Electronically Signed   By: Marin Robertshristopher  Mattern M.D.   On: 08/23/2015 19:14   Dg Hip Unilat With Pelvis 2-3 Views Right  Result Date: 08/23/2015 CLINICAL DATA:  Fall this afternoon while taking out the trash. Acute onset of right hip pain. EXAM: DG HIP (WITH OR WITHOUT PELVIS) 2-3V RIGHT COMPARISON:  None. FINDINGS: A right subcapital femoral neck fracture is  present. Increased varus angulation and slight foreshortening is present. The femoral head is located. The pelvis is otherwise intact. Degenerative changes are present in the lower lumbar spine. IMPRESSION: 1. Right subcapital femoral neck fracture with increased angulation. Electronically Signed   By: Marin Robertshristopher  Mattern M.D.   On: 08/23/2015 19:13    EKG: Independently reviewed. Rate controlled atrial fibrillation.  RBBB is not new.  Rate 80.  Assessment/Plan Principal Problem:   Femoral neck fracture (HCC) Active Problems:   Coronary atherosclerosis   Anemia, iron deficiency   Peripheral edema   HTN (hypertension), benign   Atrial fibrillation (HCC)      Acute femoral neck fracture --Does not appear to have unstable angina or decompensated CHF at this point (lower extremity edema is chronic) but has new onset atrial fibrillation which will need evaluation.  However, would not expect rate controlled afib to delay hip surgery.  Consider getting cardiology opinion in the AM. --OR per ortho, NPO after midnight --Anticoagulation deferred until after surgery  --Check U/A, chest xray, BNP, troponin, and carotid ultrasound prior to OR  New atrial fibrillation, rate controlled, asymptomatic --Echo in the AM --Serial troponin --Check TFTs --CHADS-Vasc score of 4, will need to discuss long term anticoagulation options after surgery --Check A1c for further risk stratification  Unexplained blurred vision, dizziness (chronic) --Will check carotid ultrasounds since patient is headed to the OR   DVT prophylaxis: SCDs for now, address anticoagulation after surgery Code Status: FULL Family Communication: Surrogate daughter at bedside in the ED at time of admission Disposition Plan: To be determined, expect he will need SNF Consults called: Orthopedic surgery called from the ED; consider cardiology consult in the AM Admission status: Inpatient, telemetry   TIME SPENT: 75  minutes   Jerene Bearsarter,Ambermarie Honeyman Harrison MD [facility] Pager [phone]  If 7PM-7AM, please contact night-coverage [email or web address] Password [email or web address]  08/23/2015, 8:47 PM

## 2015-08-23 NOTE — ED Notes (Addendum)
Family at bedside Connye Burkitt(Ally) leaving to go home temporarily, ask that we contact her when patient gets moved to inpatient unit. [phone].

## 2015-08-23 NOTE — ED Provider Notes (Signed)
[facility]-EMERGENCY DEPT Provider Note   CSN: [id number] Arrival date & time: 08/23/15  1716     History   Chief Complaint Chief Complaint  Patient presents with  . Fall    HPI Jeff Hunt is a 82 y.o. male.  HPI  Patient with extensive history presents after a fall.  He has had recurrent falls, according to the wife.  Today, he reports walking down his driveway, when he fell on his right leg and elbow.  He has 10/10 pain in the right hip, worse with movement.  Also has some right elbow pain.  Denies LOC, Cp, SOB, recent illness. Wife states he is acting normal.    Past Medical History:  Diagnosis Date  . Arthropathy, unspecified, site unspecified   . BPH with obstruction/lower urinary tract symptoms   . Cataract    cortical senile  . Cerebrovascular disease, unspecified   . Chronic renal insufficiency, stage II (mild)   . Chronic rhinitis   . Chronic venous insufficiency   . Coronary atherosclerosis of unspecified type of vessel, native or graft    s/p stent placement "decades ago", hx of cath after this which showed patent stent per pt report.  Marland Kitchen DEPRESSION 12/06/2006   Qualifier: Diagnosis of  By: Roxan Hockey CMA, Shanda Bumps    . Depressive disorder, not elsewhere classified   . Hearing loss of both ears   . History of actinic keratoses 08/2011  . History of iron deficiency anemia 2013   Pt declined GI eval on multiple occasions  . Lichen simplex chronicus 08/2011  . Lumbar degenerative disc disease 06/2009   Mild osteoarthritic changes of L-spine diffusely on x-rays  . Memory loss   . Osteoarthritis 06/2009   X-rays: both hips-mild  . Other and unspecified hyperlipidemia   . Rectal bleeding 2015   on/off x 3 mo; saw Dr. Dulce Sellar [facility]; CT abd/pelvis + Anusol HC supp regimen rx'd.  It appears the CT was ordered but not done (as of 02/20/14)  . TRANSIENT ISCHEMIC ATTACK, HX OF 12/06/2006   Qualifier: Diagnosis of  By: Roxan Hockey CMA, Shanda Bumps    . Urticarial dermatitis    Skin biopsy 11/2010 favored "urticarial allergic rxn related to localized hypersensitivity such as insect bite or arthropod assault"  . Venous stasis dermatitis, left 07/2007   Biopsy showed angiodermatitis, with all tests for infection NEG (fungal stain, AFB stain, and gram stain).    Patient Active Problem List   Diagnosis Date Noted  . Rectal bleeding 11/06/2013  . GERD (gastroesophageal reflux disease) 03/15/2012  . Atypical chest pain 02/24/2012  . Loss of weight 01/01/2012  . Dizziness 08/20/2011  . Rotator cuff impingement syndrome 07/22/2011  . HTN (hypertension), benign 03/16/2011  . Rash 03/06/2011  . Irregular cardiac rhythm 02/18/2011  . Peripheral edema 02/18/2011  . Anemia, iron deficiency 02/03/2011  . SKIN LESIONS, MULTIPLE 12/18/2009  . LEG PAIN 12/03/2009  . VENOUS INSUFFICIENCY, LEGS 06/14/2009  . HEMATOCHEZIA 12/06/2008  . GROSS HEMATURIA 12/06/2008  . ONYCHOMYCOSIS, TOENAILS 08/09/2007  . DERMATITIS, STASIS 08/09/2007  . MEMORY LOSS 08/01/2007  . HYPERLIPIDEMIA 12/06/2006  . DEPRESSION 12/06/2006  . CORONARY ARTERY DISEASE 12/06/2006  . CEREBROVASCULAR DISEASE 12/06/2006  . BENIGN PROSTATIC HYPERTROPHY 12/06/2006  . ARTHRITIS 12/06/2006    Past Surgical History:  Procedure Laterality Date  . APPENDECTOMY    . CATARACT EXTRACTION  10/24/2007   right  . CATARACT EXTRACTION  11/07/2007   left  . CORONARY ANGIOPLASTY WITH STENT PLACEMENT    .  TONSILLECTOMY         Home Medications    Prior to Admission medications   Medication Sig Start Date End Date Taking? Authorizing Provider  acetaZOLAMIDE (DIAMOX) 250 MG tablet ALTERNATE 2 TABLETS BY MOUTH EVERY DAY WITH 1 TABLET BY MOUTH EVERY DAY. 05/14/14   Jeoffrey MassedPhilip H McGowen, MD  diphenhydrAMINE (BENADRYL MAXIMUM STRENGTH) 2 % cream Apply topically 3 (three) times daily as needed.    Historical Provider, MD  ferrous sulfate 325 (65 FE) MG tablet Take 325 mg by mouth 2 (two) times daily.    Historical  Provider, MD  FLUoxetine (PROZAC) 40 MG capsule 1 tab po qd 06/13/14   Jeoffrey MassedPhilip H McGowen, MD  fluticasone [facility](FLONASE) 50 MCG/ACT nasal spray INSTILL 2 SPRAYS INTO EACH NOSTRIL DAILY 01/11/14   Jeoffrey MassedPhilip H McGowen, MD  metoprolol succinate (TOPROL-XL) 25 MG 24 hr tablet Take 1 tablet (25 mg total) by mouth daily. 04/30/14   Jeoffrey MassedPhilip H McGowen, MD  Naproxen (NAPROSYN PO) Take by mouth as needed.    Historical Provider, MD  pantoprazole (PROTONIX) 40 MG tablet Take 1 tablet (40 mg total) by mouth daily. 04/30/14   Jeoffrey MassedPhilip H McGowen, MD  Potassium Gluconate 550 MG TABS Take 1 tablet by mouth daily.    Historical Provider, MD  psyllium (METAMUCIL) 58.6 % packet Take 1 packet by mouth as needed.    Historical Provider, MD  simvastatin (ZOCOR) 80 MG tablet TAKE 1 TABLET BY MOUTH AT BEDTIME 06/13/14   Jeoffrey MassedPhilip H McGowen, MD  tamsulosin (FLOMAX) 0.4 MG CAPS capsule Take 1 capsule (0.4 mg total) by mouth daily. 04/16/14   Jeoffrey MassedPhilip H McGowen, MD  triamcinolone (KENALOG) 0.025 % cream Apply 2-3 times daily to skin eruptions, hives    Historical Provider, MD    Family History Family History  Problem Relation Age of Onset  . Heart disease Father [id number]  . Hypertension Mother   . Dementia Sister   . Colon cancer Neg Hx     Social History Social History  Substance Use Topics  . Smoking status: Former Smoker    Types: Pipe  . Smokeless tobacco: Never Used     Comment: Quit > 23 yrs ago.  . Alcohol use 10.5 oz/week    21 Standard drinks or equivalent per week     Allergies   Amoxicillin and Penicillins   Review of Systems Review of Systems  Constitutional: Negative for chills and fever.  HENT: Negative for ear pain and sore throat.   Eyes: Negative for pain and visual disturbance.  Respiratory: Negative for cough and shortness of breath.   Cardiovascular: Negative for chest pain and palpitations.  Gastrointestinal: Negative for abdominal pain and vomiting.  Genitourinary: Negative for dysuria and hematuria.    Musculoskeletal: Positive for arthralgias. Negative for back pain.  Skin: Negative for color change and rash.  Neurological: Negative for seizures and syncope.  All other systems reviewed and are negative.    Physical Exam Updated Vital Signs BP 119/80 (BP Location: Left Arm)   Pulse 72   Temp 98.3 F (36.8 C) (Oral)   Resp 14   SpO2 93%   Physical Exam  Constitutional: He appears well-developed and well-nourished.  HENT:  Head: Normocephalic and atraumatic.  Eyes: Conjunctivae are normal.  Neck: Neck supple.  Cardiovascular: Normal rate and regular rhythm.   No murmur heard. Pulmonary/Chest: Effort normal and breath sounds normal. No respiratory distress.  Abdominal: Soft. There is no tenderness.  Musculoskeletal: He exhibits no edema.  R  hip: Inspection: TTP ROM: limited due to pain Strength: 2/5 in flexion and 2/5 in extension Pulses: distal pulses intact Sensation: distal sensation intact  R elbow: Inspection: no deformity or effusion, small abrasion ROM: full, without pain Strength: 5/5 in flexion and 5/5 in extension Pulses: distal pulses intact Sensation: distal sensation intact    Neurological: He is alert.  Skin: Skin is warm and dry.  Psychiatric: He has a normal mood and affect.  Nursing note and vitals reviewed.    ED Treatments / Results  Labs (all labs ordered are listed, but only abnormal results are displayed) Labs Reviewed - No data to display  EKG  EKG Interpretation None       Radiology No results found.  Procedures Procedures (including critical care time)  Medications Ordered in ED Medications - No data to display   Initial Impression / Assessment and Plan / ED Course  I have reviewed the triage vital signs and the nursing notes.  Pertinent labs & imaging results that were available during my care of the patient were reviewed by me and considered in my medical decision making (see chart for details).  Clinical Course     Patient with a fall today, sounds mechanical in nature.  Patient has shortened and ext rotated right leg with TTP.  Xrs showed impacted fracture.  Labs unremarkable.  R elbow had an abrasion, but XR was negative for fracture and exam otherwise unremarkable.  Ortho consulted, patient admitted to medicine.    Final Clinical Impressions(s) / ED Diagnoses   Final diagnoses:  None    New Prescriptions New Prescriptions   No medications on file     Marcelina MorelMichael Lexton Hidalgo, MD 08/24/15 0015    Marily MemosJason Mesner, MD 08/25/15 1155

## 2015-08-23 NOTE — ED Provider Notes (Signed)
I saw and evaluated the patient, reviewed the resident's note and I agree with the findings and plan.  Sounds like a mechanical fall. Right leg slightly externally rotated and slightly shortened, suspect hip fracture. Will xr, pain meds and workup/dispo appropriately.     Marily MemosJason Zamariyah Furukawa, MD 08/25/15 1154

## 2015-08-24 ENCOUNTER — Encounter (HOSPITAL_COMMUNITY): Payer: Self-pay

## 2015-08-24 ENCOUNTER — Inpatient Hospital Stay (HOSPITAL_COMMUNITY): Payer: Medicare Other

## 2015-08-24 ENCOUNTER — Inpatient Hospital Stay (HOSPITAL_COMMUNITY): Payer: Medicare Other | Admitting: Anesthesiology

## 2015-08-24 ENCOUNTER — Encounter (HOSPITAL_COMMUNITY)
Admission: EM | Disposition: A | Discharge status: Skilled Nursing Facility | Payer: Self-pay | Source: Home / Self Care | Attending: Internal Medicine

## 2015-08-24 ENCOUNTER — Encounter (HOSPITAL_COMMUNITY): Payer: Self-pay | Admitting: Anesthesiology

## 2015-08-24 DIAGNOSIS — S72009A Fracture of unspecified part of neck of unspecified femur, initial encounter for closed fracture: Secondary | ICD-10-CM | POA: Diagnosis present

## 2015-08-24 DIAGNOSIS — W19XXXA Unspecified fall, initial encounter: Secondary | ICD-10-CM

## 2015-08-24 HISTORY — PX: ANTERIOR APPROACH HEMI HIP ARTHROPLASTY: SHX6690

## 2015-08-24 LAB — ABO/RH: ABO/RH(D): O POS

## 2015-08-24 LAB — TROPONIN I

## 2015-08-24 LAB — URINALYSIS, ROUTINE W REFLEX MICROSCOPIC
Bilirubin Urine: NEGATIVE
Glucose, UA: NEGATIVE mg/dL
HGB URINE DIPSTICK: NEGATIVE
Ketones, ur: NEGATIVE mg/dL
Nitrite: NEGATIVE
PROTEIN: NEGATIVE mg/dL
Specific Gravity, Urine: 1.02 (ref 1.005–1.030)
pH: 7 (ref 5.0–8.0)

## 2015-08-24 LAB — BASIC METABOLIC PANEL
ANION GAP: 5 (ref 5–15)
BUN: 17 mg/dL (ref 6–20)
CALCIUM: 8.8 mg/dL — AB (ref 8.9–10.3)
CHLORIDE: 113 mmol/L — AB (ref 101–111)
CO2: 20 mmol/L — AB (ref 22–32)
Creatinine, Ser: 0.92 mg/dL (ref 0.61–1.24)
GFR calc non Af Amer: >60 mL/min (ref >60)
GLUCOSE: 156 mg/dL — AB (ref 65–99)
Potassium: 4 mmol/L (ref 3.5–5.1)
Sodium: 138 mmol/L (ref 135–145)

## 2015-08-24 LAB — CBC
HEMATOCRIT: 40.3 % (ref 39.0–52.0)
HEMOGLOBIN: 12.7 g/dL — AB (ref 13.0–17.0)
MCH: 30.8 pg (ref 26.0–34.0)
MCHC: 31.5 g/dL (ref 30.0–36.0)
MCV: 97.8 fL (ref 78.0–100.0)
Platelets: 148 10*3/uL — ABNORMAL LOW (ref 150–400)
RBC: 4.12 MIL/uL — ABNORMAL LOW (ref 4.22–5.81)
RDW: 13.2 % (ref 11.5–15.5)
WBC: 7.4 10*3/uL (ref 4.0–10.5)

## 2015-08-24 LAB — URINE MICROSCOPIC-ADD ON: Bacteria, UA: NONE SEEN

## 2015-08-24 LAB — SURGICAL PCR SCREEN
MRSA, PCR: NEGATIVE
STAPHYLOCOCCUS AUREUS: POSITIVE — AB

## 2015-08-24 LAB — T4, FREE: FREE T4: 0.79 ng/dL (ref 0.61–1.12)

## 2015-08-24 LAB — TSH: TSH: 1.621 u[IU]/mL (ref 0.350–4.500)

## 2015-08-24 SURGERY — HEMIARTHROPLASTY, HIP, DIRECT ANTERIOR APPROACH, FOR FRACTURE
Anesthesia: General | Site: Hip | Laterality: Right

## 2015-08-24 MED ORDER — 0.9 % SODIUM CHLORIDE (POUR BTL) OPTIME
TOPICAL | Status: DC | PRN
  Administered 2015-08-24 (×3): 1000 mL

## 2015-08-24 MED ORDER — PROPOFOL 10 MG/ML IV BOLUS
INTRAVENOUS | Status: DC | PRN
  Administered 2015-08-24 (×2): 50 mg via INTRAVENOUS

## 2015-08-24 MED ORDER — VASOPRESSIN 20 UNIT/ML IV SOLN
INTRAVENOUS | Status: DC | PRN
  Administered 2015-08-24: 1 [IU] via INTRAVENOUS

## 2015-08-24 MED ORDER — PHENOL 1.4 % MT LIQD: 1.0000 | OROMUCOSAL | Status: DC | PRN

## 2015-08-24 MED ORDER — ONDANSETRON HCL 4 MG/2ML IJ SOLN: 4.0000 mg | Freq: Four times a day (QID) | INTRAMUSCULAR | Status: DC | PRN

## 2015-08-24 MED ORDER — ONDANSETRON HCL 4 MG PO TABS: 4.0000 mg | ORAL_TABLET | Freq: Four times a day (QID) | ORAL | Status: DC | PRN

## 2015-08-24 MED ORDER — LIDOCAINE 2% (20 MG/ML) 5 ML SYRINGE
INTRAMUSCULAR | Status: AC
Start: 2015-08-24 — End: 2015-08-24
  Filled 2015-08-24: qty 5

## 2015-08-24 MED ORDER — FENTANYL CITRATE (PF) 100 MCG/2ML IJ SOLN
INTRAMUSCULAR | Status: AC
  Filled 2015-08-24: qty 2

## 2015-08-24 MED ORDER — ROCURONIUM BROMIDE 10 MG/ML (PF) SYRINGE
PREFILLED_SYRINGE | INTRAVENOUS | Status: AC
  Filled 2015-08-24: qty 10

## 2015-08-24 MED ORDER — MIRABEGRON ER 25 MG PO TB24
25.0000 mg | ORAL_TABLET | Freq: Every day | ORAL | Status: DC
  Administered 2015-08-24 – 2015-08-30 (×7): 25 mg via ORAL
  Filled 2015-08-24 (×7): qty 1

## 2015-08-24 MED ORDER — CLINDAMYCIN PHOSPHATE 600 MG/50ML IV SOLN
600.0000 mg | Freq: Once | INTRAVENOUS | Status: AC
  Administered 2015-08-24: 600 mg via INTRAVENOUS
  Filled 2015-08-24: qty 50

## 2015-08-24 MED ORDER — ACETAMINOPHEN 650 MG RE SUPP: 650.0000 mg | Freq: Four times a day (QID) | RECTAL | Status: DC | PRN

## 2015-08-24 MED ORDER — LIDOCAINE HCL (CARDIAC) 20 MG/ML IV SOLN
INTRAVENOUS | Status: DC | PRN
  Administered 2015-08-24: 100 mg via INTRAVENOUS

## 2015-08-24 MED ORDER — CEFAZOLIN SODIUM-DEXTROSE 2-4 GM/100ML-% IV SOLN: 2.0000 g | Freq: Once | INTRAVENOUS | Status: DC

## 2015-08-24 MED ORDER — PHENYLEPHRINE HCL 10 MG/ML IJ SOLN
INTRAVENOUS | Status: DC | PRN
  Administered 2015-08-24: 50 ug/min via INTRAVENOUS

## 2015-08-24 MED ORDER — TRANEXAMIC ACID 1000 MG/10ML IV SOLN
INTRAVENOUS | Status: DC | PRN
  Administered 2015-08-24: 2000 mg via TOPICAL

## 2015-08-24 MED ORDER — ENSURE ENLIVE PO LIQD
237.0000 mL | Freq: Every day | ORAL | Status: DC
  Administered 2015-08-25 – 2015-08-30 (×6): 237 mL via ORAL
  Filled 2015-08-24: qty 237

## 2015-08-24 MED ORDER — ONDANSETRON HCL 4 MG/2ML IJ SOLN
INTRAMUSCULAR | Status: DC | PRN
  Administered 2015-08-24: 4 mg via INTRAVENOUS

## 2015-08-24 MED ORDER — PHENYLEPHRINE HCL 10 MG/ML IJ SOLN
INTRAMUSCULAR | Status: DC | PRN
Start: 2015-08-24 — End: 2015-08-24
  Administered 2015-08-24: 80 ug via INTRAVENOUS
  Administered 2015-08-24: 120 ug via INTRAVENOUS
  Administered 2015-08-24: 80 ug via INTRAVENOUS
  Administered 2015-08-24: 40 ug via INTRAVENOUS
  Administered 2015-08-24: 240 ug via INTRAVENOUS
  Administered 2015-08-24: 80 ug via INTRAVENOUS

## 2015-08-24 MED ORDER — PHENYLEPHRINE 40 MCG/ML (10ML) SYRINGE FOR IV PUSH (FOR BLOOD PRESSURE SUPPORT)
PREFILLED_SYRINGE | INTRAVENOUS | Status: AC
  Filled 2015-08-24: qty 10

## 2015-08-24 MED ORDER — DILTIAZEM LOAD VIA INFUSION
10.0000 mg | Freq: Once | INTRAVENOUS | Status: AC
  Administered 2015-08-24: 10 mg via INTRAVENOUS
  Filled 2015-08-24: qty 10

## 2015-08-24 MED ORDER — CEFAZOLIN SODIUM-DEXTROSE 2-4 GM/100ML-% IV SOLN
INTRAVENOUS | Status: AC
  Filled 2015-08-24: qty 100

## 2015-08-24 MED ORDER — LACTATED RINGERS IV SOLN
INTRAVENOUS | Status: DC | PRN
  Administered 2015-08-24 (×3): via INTRAVENOUS

## 2015-08-24 MED ORDER — CITALOPRAM HYDROBROMIDE 20 MG PO TABS
20.0000 mg | ORAL_TABLET | Freq: Every day | ORAL | Status: DC
Start: 2015-08-24 — End: 2015-08-30
  Administered 2015-08-24 – 2015-08-30 (×7): 20 mg via ORAL
  Filled 2015-08-24 (×7): qty 1

## 2015-08-24 MED ORDER — MENTHOL 3 MG MT LOZG: 1.0000 | LOZENGE | OROMUCOSAL | Status: DC | PRN

## 2015-08-24 MED ORDER — VASOPRESSIN 20 UNIT/ML IV SOLN
INTRAVENOUS | Status: AC
  Filled 2015-08-24: qty 1

## 2015-08-24 MED ORDER — SODIUM CHLORIDE 0.9 % IV SOLN
INTRAVENOUS | Status: DC
  Administered 2015-08-24: 20:00:00 via INTRAVENOUS

## 2015-08-24 MED ORDER — METOCLOPRAMIDE HCL 5 MG PO TABS: 5.0000 mg | ORAL_TABLET | Freq: Three times a day (TID) | ORAL | Status: DC | PRN

## 2015-08-24 MED ORDER — METOPROLOL TARTRATE 5 MG/5ML IV SOLN
INTRAVENOUS | Status: DC | PRN
  Administered 2015-08-24: 2 mg via INTRAVENOUS
  Administered 2015-08-24: 1 mg via INTRAVENOUS
  Administered 2015-08-24 (×2): 2 mg via INTRAVENOUS

## 2015-08-24 MED ORDER — DILTIAZEM HCL 100 MG IV SOLR
5.0000 mg/h | INTRAVENOUS | Status: DC
  Administered 2015-08-24 – 2015-08-25 (×3): 5 mg/h via INTRAVENOUS
  Filled 2015-08-24 (×3): qty 100

## 2015-08-24 MED ORDER — METOCLOPRAMIDE HCL 5 MG/ML IJ SOLN: 5.0000 mg | Freq: Three times a day (TID) | INTRAMUSCULAR | Status: DC | PRN

## 2015-08-24 MED ORDER — ACETAMINOPHEN 325 MG PO TABS
650.0000 mg | ORAL_TABLET | Freq: Four times a day (QID) | ORAL | Status: DC | PRN
  Administered 2015-08-30: 650 mg via ORAL
  Filled 2015-08-24: qty 2

## 2015-08-24 MED ORDER — CLINDAMYCIN PHOSPHATE 600 MG/50ML IV SOLN
600.0000 mg | Freq: Four times a day (QID) | INTRAVENOUS | Status: AC
Start: 2015-08-24 — End: 2015-08-25
  Administered 2015-08-24 – 2015-08-25 (×2): 600 mg via INTRAVENOUS
  Filled 2015-08-24 (×2): qty 50

## 2015-08-24 MED ORDER — DEXTROSE 5 % IV SOLN
5.0000 mg/h | INTRAVENOUS | Status: DC
  Filled 2015-08-24: qty 100

## 2015-08-24 MED ORDER — POTASSIUM CHLORIDE IN NACL 20-0.9 MEQ/L-% IV SOLN
INTRAVENOUS | Status: AC
  Administered 2015-08-24: 23:00:00 via INTRAVENOUS
  Administered 2015-08-25: 75 mL/h via INTRAVENOUS
  Filled 2015-08-24 (×2): qty 1000

## 2015-08-24 MED ORDER — TRANEXAMIC ACID 1000 MG/10ML IV SOLN
2000.0000 mg | Freq: Once | INTRAVENOUS | Status: DC
  Filled 2015-08-24: qty 20

## 2015-08-24 MED ORDER — PROPOFOL 10 MG/ML IV BOLUS
INTRAVENOUS | Status: AC
  Filled 2015-08-24: qty 20

## 2015-08-24 MED ORDER — ROCURONIUM BROMIDE 100 MG/10ML IV SOLN
INTRAVENOUS | Status: DC | PRN
  Administered 2015-08-24: 60 mg via INTRAVENOUS

## 2015-08-24 MED ORDER — ONDANSETRON HCL 4 MG/2ML IJ SOLN
INTRAMUSCULAR | Status: AC
  Filled 2015-08-24: qty 2

## 2015-08-24 MED ORDER — FENTANYL CITRATE (PF) 100 MCG/2ML IJ SOLN
INTRAMUSCULAR | Status: AC
  Filled 2015-08-24: qty 4

## 2015-08-24 MED ORDER — SUGAMMADEX SODIUM 200 MG/2ML IV SOLN
INTRAVENOUS | Status: DC | PRN
  Administered 2015-08-24: 160 mg via INTRAVENOUS

## 2015-08-24 MED ORDER — CALCIUM POLYCARBOPHIL 625 MG PO TABS
625.0000 mg | ORAL_TABLET | Freq: Every day | ORAL | Status: DC
  Administered 2015-08-25 – 2015-08-30 (×6): 625 mg via ORAL
  Filled 2015-08-24 (×6): qty 1

## 2015-08-24 MED ORDER — RIVAROXABAN 10 MG PO TABS
10.0000 mg | ORAL_TABLET | Freq: Every day | ORAL | Status: DC
  Administered 2015-08-25 – 2015-08-30 (×6): 10 mg via ORAL
  Filled 2015-08-24 (×6): qty 1

## 2015-08-24 MED ORDER — FENTANYL CITRATE (PF) 100 MCG/2ML IJ SOLN
INTRAMUSCULAR | Status: DC | PRN
  Administered 2015-08-24 (×6): 50 ug via INTRAVENOUS

## 2015-08-24 MED ORDER — SUCCINYLCHOLINE CHLORIDE 200 MG/10ML IV SOSY
PREFILLED_SYRINGE | INTRAVENOUS | Status: AC
  Filled 2015-08-24: qty 10

## 2015-08-24 MED ORDER — METOPROLOL TARTRATE 5 MG/5ML IV SOLN
INTRAVENOUS | Status: AC
  Filled 2015-08-24: qty 10

## 2015-08-24 SURGICAL SUPPLY — 51 items
BENZOIN TINCTURE PRP APPL 2/3 (GAUZE/BANDAGES/DRESSINGS) ×3 IMPLANT
BLADE SAW SGTL 18X1.27X75 (BLADE) ×2 IMPLANT
BLADE SAW SGTL 18X1.27X75MM (BLADE) ×1
BLADE SURG ROTATE 9660 (MISCELLANEOUS) IMPLANT
CAPT HIP HEMI 2 ×3 IMPLANT
CELLS DAT CNTRL 66122 CELL SVR (MISCELLANEOUS) ×1 IMPLANT
CLOSURE WOUND 1/2 X4 (GAUZE/BANDAGES/DRESSINGS) ×2
COVER SURGICAL LIGHT HANDLE (MISCELLANEOUS) ×3 IMPLANT
DRAPE C-ARM 42X72 X-RAY (DRAPES) ×3 IMPLANT
DRAPE STERI IOBAN 125X83 (DRAPES) ×3 IMPLANT
DRAPE U-SHAPE 47X51 STRL (DRAPES) ×9 IMPLANT
DRSG AQUACEL AG ADV 3.5X10 (GAUZE/BANDAGES/DRESSINGS) ×3 IMPLANT
DRSG AQUACEL AG ADV 3.5X14 (GAUZE/BANDAGES/DRESSINGS) ×3 IMPLANT
DURAPREP 26ML APPLICATOR (WOUND CARE) ×3 IMPLANT
ELECT BLADE 4.0 EZ CLEAN MEGAD (MISCELLANEOUS) ×3
ELECT BLADE 6.5 EXT (BLADE) IMPLANT
ELECT REM PT RETURN 9FT ADLT (ELECTROSURGICAL) ×3
ELECTRODE BLDE 4.0 EZ CLN MEGD (MISCELLANEOUS) ×1 IMPLANT
ELECTRODE REM PT RTRN 9FT ADLT (ELECTROSURGICAL) ×1 IMPLANT
FACESHIELD WRAPAROUND (MASK) ×6 IMPLANT
GLOVE BIOGEL PI IND STRL 8 (GLOVE) ×2 IMPLANT
GLOVE BIOGEL PI INDICATOR 8 (GLOVE) ×4
GLOVE ECLIPSE 8.0 STRL XLNG CF (GLOVE) ×3 IMPLANT
GLOVE ORTHO TXT STRL SZ7.5 (GLOVE) ×6 IMPLANT
GOWN STRL REUS W/ TWL LRG LVL3 (GOWN DISPOSABLE) ×2 IMPLANT
GOWN STRL REUS W/ TWL XL LVL3 (GOWN DISPOSABLE) ×2 IMPLANT
GOWN STRL REUS W/TWL LRG LVL3 (GOWN DISPOSABLE) ×4
GOWN STRL REUS W/TWL XL LVL3 (GOWN DISPOSABLE) ×4
HANDPIECE INTERPULSE COAX TIP (DISPOSABLE) ×2
KIT BASIN OR (CUSTOM PROCEDURE TRAY) ×3 IMPLANT
KIT ROOM TURNOVER OR (KITS) ×3 IMPLANT
MANIFOLD NEPTUNE II (INSTRUMENTS) ×3 IMPLANT
NS IRRIG 1000ML POUR BTL (IV SOLUTION) ×3 IMPLANT
PACK TOTAL JOINT (CUSTOM PROCEDURE TRAY) ×3 IMPLANT
PAD ARMBOARD 7.5X6 YLW CONV (MISCELLANEOUS) ×3 IMPLANT
RTRCTR WOUND ALEXIS 18CM MED (MISCELLANEOUS) ×3
SET HNDPC FAN SPRY TIP SCT (DISPOSABLE) ×1 IMPLANT
STAPLER VISISTAT 35W (STAPLE) ×3 IMPLANT
STRIP CLOSURE SKIN 1/2X4 (GAUZE/BANDAGES/DRESSINGS) ×4 IMPLANT
SUT ETHIBOND NAB CT1 #1 30IN (SUTURE) ×3 IMPLANT
SUT MNCRL AB 4-0 PS2 18 (SUTURE) IMPLANT
SUT VIC AB 0 CT1 27 (SUTURE) ×2
SUT VIC AB 0 CT1 27XBRD ANBCTR (SUTURE) ×1 IMPLANT
SUT VIC AB 1 CT1 27 (SUTURE) ×6
SUT VIC AB 1 CT1 27XBRD ANBCTR (SUTURE) ×3 IMPLANT
SUT VIC AB 2-0 CT1 27 (SUTURE) ×6
SUT VIC AB 2-0 CT1 TAPERPNT 27 (SUTURE) ×3 IMPLANT
TOWEL OR 17X24 6PK STRL BLUE (TOWEL DISPOSABLE) ×3 IMPLANT
TOWEL OR 17X26 10 PK STRL BLUE (TOWEL DISPOSABLE) ×3 IMPLANT
TRAY FOLEY CATH 16FRSI W/METER (SET/KITS/TRAYS/PACK) IMPLANT
WATER STERILE IRR 1000ML POUR (IV SOLUTION) ×6 IMPLANT

## 2015-08-24 NOTE — Anesthesia Procedure Notes (Signed)
Procedure Name: Intubation Date/Time: 08/24/2015 2:09 PM Performed by: Faustino CongressWHITE, Fitzgerald Dunne TENA Sayvon Arterberry Pre-anesthesia Checklist: Patient identified, Emergency Drugs available, Suction available and Patient being monitored Patient Re-evaluated:Patient Re-evaluated prior to inductionOxygen Delivery Method: Circle System Utilized Preoxygenation: Pre-oxygenation with 100% oxygen Intubation Type: IV induction Ventilation: Mask ventilation without difficulty Laryngoscope Size: Mac and 4 Grade View: Grade I Tube type: Oral Tube size: 7.5 mm Number of attempts: 1 Airway Equipment and Method: Stylet Placement Confirmation: ETT inserted through vocal cords under direct vision,  positive ETCO2 and breath sounds checked- equal and bilateral Secured at: 23 cm Tube secured with: Tape Dental Injury: Teeth and Oropharynx as per pre-operative assessment

## 2015-08-24 NOTE — Op Note (Signed)
Jeff Rea:  Hunt, Jeff Hunt             ACCOUNT NO.:  [id number]  MEDICAL RECORD NO.:  [id number]  LOCATION:  [facility]                         FACILITY:  [facility]  PHYSICIAN:  Jeff Hunt, M.D.    DATE OF BIRTH:  11/26/1925  DATE OF PROCEDURE: DATE OF DISCHARGE:                              OPERATIVE REPORT   PREOPERATIVE DIAGNOSIS:  Right hip femoral neck fracture, displaced.  POSTOPERATIVE DIAGNOSIS:  Right hip femoral neck fracture, displaced.  PROCEDURE:  Right hip hemiarthroplasty, uncemented DePuy Press-Fit 13 Corail stem 51 mm ball 1.5 neck standard offset DePuy.  SURGEON:  Jeff Hunt, M.D.  ASSISTANT:  Allie Bossierhris Blackman.  INDICATIONS:  Jeff Hunt is a patient with right hip fracture and ambulatory, who presents for operative management after explanation of risks and benefits.  PROCEDURE IN DETAIL:  The patient was brought to operating room where general anesthetic was induced.  Preoperative antibiotics administered. Time-out was called.  The patient was placed on the Hana bed and placed in mild traction.  Right hip area was prescrubbed with alcohol and Betadine, allowed to air dry, prepped with DuraPrep solution and draped in a sterile manner.  Wall drape was utilized.  Incision was made just 2 cm posterior and inferior to the anterior superior iliac crest after time-out was called.  Skin and subcutaneous tissue was sharply divided. Fascia lata fascia was then divided just above the iliotibial band fascia.  Blunt dissection was performed between the rectus and tensor fascia lata.  The crossing circumflex vessels were identified and coagulated.  Excursion was achieved by placing a Hohmann around the superior and then inferior femoral neck capsulotomy was performed in a V- shape fashion along the intertrochanteric ridge.  Head was removed and sized to 51.  Neck cut was made.  At this time, the leg was externally rotated.  Inferior capsule was released.  Leg was then taken  into extension and adduction.  Femoral hook was placed.  The femur was then broached coplanes and trialed with 13 stem.  13 stem fit nicely and was reduced with a 51 ball 1.5 neck.  This gave slight increase in leg length, but the fit was better with the 13 and the 12.  The 12 mm stem was in slight varus, but the 13 mm stem fit better.  The 13 stem was then chosen and the hip was reduced with the same bipolar prosthesis, it was very stable in internal-external rotation, as well as extension and external rotation.  Leg lengths approximately equal possibly 5, 6 mm long, but fit of the stem and the canal was better with the 13 and the 12.  Thorough irrigation was performed.  Tranexamic acid sponge placed after 3 minutes in the incision, the incision was then irrigated. Capsule closed using #1 Vicryl suture followed by closure of the fascia lata using #1 Vicryl suture followed by 0 Vicryl suture, 2- 0 Vicryl suture, and skin staples.  A dressing was placed.  The patient tolerated the procedure well without immediate complication. Transferred to the recovery room in stable condition.     Jeff Hunt, M.D.     GSD/MEDQ  D:  08/24/2015  T:  08/24/2015  Job:  [id number] 

## 2015-08-24 NOTE — Progress Notes (Signed)
Initial Nutrition Assessment  DOCUMENTATION CODES:   Not applicable  INTERVENTION:  Once diet advances, provide Ensure Enlive po once daily, each supplement provides 350 kcal and 20 grams of protein.  NUTRITION DIAGNOSIS:   Increased nutrient needs related to  (surgery) as evidenced by estimated needs.  GOAL:   Patient will meet greater than or equal to 90% of their needs  MONITOR:   Supplement acceptance, Diet advancement, Labs, Weight trends, Skin, I & O's  REASON FOR ASSESSMENT:   Consult Hip fracture protocol  ASSESSMENT:    82 y.o. gentleman with a history of CAD, TIA, CKD 2, iron deficiency anemia, and BPH who presents to the ED tonight after having a mechanical fall at home. The patient has been diagnosed with a right subcapital femoral neck fracture with increased angulation.  Pt is currently NPO for surgery today. Daughter at bedside. Pt very hard of hearing. Daughter reports pt has been eating fine PTA with usual intake of at least 2-3 meals a day with an Ensure/Boost Shake on occasion. Weight has been stable. RD to order Ensure to aid in healing. Nursing staff to provide once diet advances. Daughter educated on increased protein needs for healing post op.   Pt with no observed significant fat or muscle mass loss.   Labs and medications reviewed.   Diet Order:  Diet NPO time specified  Skin:  Reviewed, no issues  Last BM:  8/18  Height:   Ht Readings from Last 1 Encounters:  08/23/15 5\' 11"  (1.803 m)    Weight:   Wt Readings from Last 1 Encounters:  08/23/15 180 lb (81.6 kg)    Ideal Body Weight:  78 kg  BMI:  Body mass index is 25.1 kg/m.  Estimated Nutritional Needs:   Kcal:  1850-2050  Protein:  80-95 grams  Fluid:  1.8 - 2 L/day  EDUCATION NEEDS:   Education needs addressed  Roslyn SmilingStephanie Anetta Olvera, MS, RD, LDN Pager # [phone] After hours/ weekend pager # [phone]

## 2015-08-24 NOTE — Progress Notes (Addendum)
Patient seen.  He is deaf.  Explained to him somewhat about his right hip fracture but he would prefer that his daughter be here for further explanation.  Medical workup in progress for new onset A. fib.  Troponin levels pending.  Plan for surgery later today.  If medically optimized

## 2015-08-24 NOTE — Anesthesia Postprocedure Evaluation (Signed)
Anesthesia Post Note  Patient: Jeff Hunt  Procedure(s) Performed: Procedure(s) (LRB): ANTERIOR APPROACH HEMI HIP ARTHROPLASTY (Right)  Patient location during evaluation: PACU Anesthesia Type: General Level of consciousness: sedated and patient cooperative Pain management: pain level controlled Vital Signs Assessment: post-procedure vital signs reviewed and stable Respiratory status: spontaneous breathing Cardiovascular status: stable Anesthetic complications: no    Last Vitals:  Vitals:   08/24/15 1730 08/24/15 1740  BP: 118/74 119/68  Pulse: 74 75  Resp: 18 20  Temp:      Last Pain:  Vitals:   08/24/15 1637  TempSrc:   PainSc: Asleep                 Lewie LoronJohn Kazuko Clemence

## 2015-08-24 NOTE — Transfer of Care (Signed)
Immediate Anesthesia Transfer of Care Note  Patient: Jeff Hunt  Procedure(s) Performed: Procedure(s): ANTERIOR APPROACH HEMI HIP ARTHROPLASTY (Right)  Patient Location: PACU  Anesthesia Type:General  Level of Consciousness: sedated  Airway & Oxygen Therapy: Patient Spontanous Breathing and Patient connected to face mask oxygen  Post-op Assessment: Report given to RN and Post -op Vital signs reviewed and stable  Post vital signs: Reviewed and stable  Last Vitals:  Vitals:   08/24/15 1300 08/24/15 1635  BP: 118/66   Pulse: 84   Resp: 20   Temp: 36.8 C 37.2 C    Last Pain:  Vitals:   08/24/15 1300  TempSrc: Oral  PainSc:       Patients Stated Pain Goal: 2 (08/24/15 0519)  Complications: No apparent anesthesia complications

## 2015-08-24 NOTE — Brief Op Note (Signed)
08/23/2015 - 08/24/2015  4:24 PM  PATIENT:  Jeff Hunt  82 y.o. male  PRE-OPERATIVE DIAGNOSIS:  right hip fracture  POST-OPERATIVE DIAGNOSIS:  right hip fracture  PROCEDURE:  Procedure(s): ANTERIOR APPROACH HEMI HIP ARTHROPLASTY  SURGEON:  Dorene GrebeScott Izzac Rockett MD ASSISTANT: Allie Bossierhris Blackman MD  ANESTHESIA:   general  EBL: 200 ml    Total I/O In: 2000 [I.V.:2000] Out: 250 [Blood:250]  BLOOD ADMINISTERED: none  DRAINS: none   LOCAL MEDICATIONS USED:  none  SPECIMEN:  No Specimen  COUNTS:  YES  TOURNIQUET:  * No tourniquets in log *  DICTATION: .Other Dictation: Dictation Number [id number]  PLAN OF CARE: Admit to inpatient   PATIENT DISPOSITION:  PACU - hemodynamically stable

## 2015-08-24 NOTE — Progress Notes (Addendum)
PROGRESS NOTE    Jeff Hunt  GNF:[id number] DOB: 09/08/1925 DOA: 08/23/2015 PCP: Ginette OttoSTONEKING,HAL THOMAS, MD    Brief Narrative:  82yo man presented after a mechanical fall at home.  He was diagnosed with a right subcapital femoral neck fracture.  On admission he was found to be in Afib with RVR, this is a new diagnosis, but unclear duration given last EKG in the system is from 2014.    He reports no history of MI, CAD, Heart failure or TIA, though they are listed in his history.  Orthopedics has evaluated him for surgery.  He is on acetazolamide for reported chronic LE edema.    Assessment & Plan:   Femoral neck fracture - Patient denies history of CAD, MI, TIA.  He does have old imaging from 2003 - 2004 showing old periventricular infarction.  He reports no further TIA/stroke that he remembers.  He does not have DM2 requiring insulin and his Cr is < 1.  He has no history of lung disease that he is aware of.   - RCRI is 1-2 (if history of CAD or stenting which he does not remember) which gives him a risk of 1.0-2.4% for intraoperative cardiac death/MI and a 1.3 - 3.6% risk of MI, Vfib, complete heart block.   - Patient has been seen by orthopedic surgery and his Afib should not preclude continuing with surgery - NPO  - pain control with morphine PRN  Atrial fibrillation - Spoke with Cardiology who recommended beta blocker (on Metoprolol XL 25mg  daily), check TSH (1.621) and TTE (pending).  - Further work up for new Afib (unclear onset, within last 3 years) after surgery.  - Telemetry - Goal of rate control.  Increase metoprolol if needed to get HR < 110 - Discussion re: Anticoagulation after surgery    Anemia, iron deficiency - Hgb 12.7.  He is on daily iron which was continued.     Peripheral edema - Holding acetazolamide in acute setting - Renal function stable.     HTN (hypertension), benign - BP well controlled from this morning, 124/85.  - Continue metoprolol,  tamsulosin - If BP worsens, consider adding amlodipine.    DVT prophylaxis: SCDs Code Status: Full Family Communication: Family at bedside, helped with hearing issues Disposition Plan: Pending surgery   Consultants:   Orthopedics  Procedures:   TTE  Antimicrobials:   None    Subjective: Pain in the hip, hard of hearing.  Interested in surgery to correct broken hip.    Objective: Vitals:   08/23/15 2115 08/23/15 2146 08/24/15 0033 08/24/15 0515  BP: 136/83 (!) 151/81 121/75 124/85  Pulse: 100 95 62 (!) 135  Resp: 19 20 18 18   Temp:  99.5 F (37.5 C) 99.4 F (37.4 C) 98.8 F (37.1 C)  TempSrc:  Oral Oral Axillary  SpO2: 92% 92% 91% 94%  Weight:      Height:       No intake or output data in the 24 hours ending 08/24/15 1120 Filed Weights   08/23/15 1928  Weight: 180 lb (81.6 kg)    Examination:  General exam: Appears calm and comfortable, very hard of hearing.  Respiratory system: Clear to auscultation. Respiratory effort normal. Cardiovascular system: S1 & S2 heard, irreg irreg, tachycardic. No murmurs, rubs, gallops or clicks. Gastrointestinal system: Abdomen is nondistended, soft and nontender. Normal bowel sounds heard. Central nervous system: Alert and oriented. No focal neurological deficits.  Very hard of hearing Skin: No rashes, lesions or  ulcers Psychiatry: Judgement and insight appear normal. Mood & affect appropriate.     Data Reviewed:  CBC:  Recent Labs Lab 08/23/15 2013 08/24/15 0410  WBC 9.3 7.4  NEUTROABS 7.8*  --   HGB 14.1 12.7*  HCT 44.5 40.3  MCV 99.1 97.8  PLT 162 148*   Basic Metabolic Panel:  Recent Labs Lab 08/23/15 2013 08/24/15 0410  NA 139 138  K 4.0 4.0  CL 112* 113*  CO2 20* 20*  GLUCOSE 116* 156*  BUN 15 17  CREATININE 0.98 0.92  CALCIUM 9.1 8.8*   GFR: Estimated Creatinine Clearance: 59.1 mL/min (by C-G formula based on SCr of 0.92 mg/dL). Liver Function Tests:  Recent Labs Lab 08/23/15 2013   AST 27  ALT 22  ALKPHOS 76  BILITOT 0.4  PROT 6.9  ALBUMIN 4.0   No results for input(s): LIPASE, AMYLASE in the last 168 hours. No results for input(s): AMMONIA in the last 168 hours. Coagulation Profile:  Recent Labs Lab 08/23/15 2243  INR 1.07   Cardiac Enzymes:  Recent Labs Lab 08/23/15 2243 08/24/15 0410  TROPONINI <0.03 <0.03   BNP (last 3 results) No results for input(s): PROBNP in the last 8760 hours. HbA1C: No results for input(s): HGBA1C in the last 72 hours. CBG: No results for input(s): GLUCAP in the last 168 hours. Lipid Profile: No results for input(s): CHOL, HDL, LDLCALC, TRIG, CHOLHDL, LDLDIRECT in the last 72 hours. Thyroid Function Tests:  Recent Labs  08/23/15 2243  TSH 1.621  FREET4 0.79   Anemia Panel: No results for input(s): VITAMINB12, FOLATE, FERRITIN, TIBC, IRON, RETICCTPCT in the last 72 hours. Sepsis Labs: No results for input(s): PROCALCITON, LATICACIDVEN in the last 168 hours.  Recent Results (from the past 240 hour(s))  Surgical pcr screen     Status: Abnormal   Collection Time: 08/24/15  5:33 AM  Result Value Ref Range Status   MRSA, PCR NEGATIVE NEGATIVE Final   Staphylococcus aureus POSITIVE (A) NEGATIVE Final    Comment:        The Xpert SA Assay (FDA approved for NASAL specimens in patients over 65 years of age), is one component of a comprehensive surveillance program.  Test performance has been validated by [facility] for patients greater than or equal to 27 year old. It is not intended to diagnose infection nor to guide or monitor treatment.          Radiology Studies: Dg Elbow Complete Right (3+view)  Result Date: 08/23/2015 CLINICAL DATA:  Fall today. Right hip fracture. Abrasion to the right elbow. EXAM: RIGHT ELBOW - COMPLETE 3+ VIEW COMPARISON:  None. FINDINGS: Right elbow is located. There is no significant effusion. No acute fracture is present. Soft tissue swelling is present posterior to the  olecranon. IMPRESSION: Soft tissue swelling posterior to the elbow without underlying fracture. Electronically Signed   By: Marin Roberts M.D.   On: 08/23/2015 19:14   Chest Portable 1 View  Result Date: 08/23/2015 CLINICAL DATA:  Fall EXAM: PORTABLE CHEST 1 VIEW COMPARISON:  05/25/2011 FINDINGS: Cardiomediastinal silhouette is stable. No acute infiltrate or pleural effusion. No pulmonary edema. Stable left basilar scarring. IMPRESSION: No active disease. Electronically Signed   By: Natasha Mead M.D.   On: 08/23/2015 22:13   Dg Hip Unilat With Pelvis 2-3 Views Right  Result Date: 08/23/2015 CLINICAL DATA:  Fall this afternoon while taking out the trash. Acute onset of right hip pain. EXAM: DG HIP (WITH OR WITHOUT PELVIS) 2-3V RIGHT  COMPARISON:  None. FINDINGS: A right subcapital femoral neck fracture is present. Increased varus angulation and slight foreshortening is present. The femoral head is located. The pelvis is otherwise intact. Degenerative changes are present in the lower lumbar spine. IMPRESSION: 1. Right subcapital femoral neck fracture with increased angulation. Electronically Signed   By: Marin Robertshristopher  Mattern M.D.   On: 08/23/2015 19:13   Scheduled Meds: . acetaZOLAMIDE  250 mg Oral Daily  . atorvastatin  40 mg Oral q1800  . ferrous sulfate  325 mg Oral BID  . fluticasone  1 spray Each Nare Daily  . metoprolol succinate  25 mg Oral Daily  . pantoprazole  40 mg Oral Daily  . tamsulosin  0.4 mg Oral Daily   Continuous Infusions:    LOS: 1 day    Time spent: 25 minutes    Debe CoderMULLEN, Janele Lague, MD [facility] Pager [phone]  If 7PM-7AM, please contact night-coverage [email or web address] Password [email or web address] 08/24/2015, 11:20 AM

## 2015-08-24 NOTE — Anesthesia Preprocedure Evaluation (Addendum)
Anesthesia Evaluation  Patient identified by MRN, date of birth, ID band Patient awake    Reviewed: Allergy & Precautions, NPO status , Patient's Chart, lab work & pertinent test results, reviewed documented beta blocker date and time   Airway Mallampati: II  TM Distance: >3 FB Neck ROM: Full    Dental no notable dental hx.    Pulmonary neg pulmonary ROS, former smoker,    Pulmonary exam normal breath sounds clear to auscultation       Cardiovascular hypertension, Pt. on home beta blockers and Pt. on medications + CAD and + Peripheral Vascular Disease  negative cardio ROS Normal cardiovascular exam Rhythm:Irregular Rate:Normal     Neuro/Psych PSYCHIATRIC DISORDERS Depression TIAnegative neurological ROS  negative psych ROS   GI/Hepatic Neg liver ROS, GERD  ,  Endo/Other  negative endocrine ROS  Renal/GU Renal InsufficiencyRenal disease  negative genitourinary   Musculoskeletal  (+) Arthritis ,   Abdominal   Peds negative pediatric ROS (+)  Hematology negative hematology ROS (+) anemia ,   Anesthesia Other Findings   Reproductive/Obstetrics negative OB ROS                            Anesthesia Physical Anesthesia Plan  ASA: III  Anesthesia Plan: General   Post-op Pain Management:    Induction: Intravenous  Airway Management Planned: Oral ETT  Additional Equipment:   Intra-op Plan:   Post-operative Plan: Extubation in OR  Informed Consent: I have reviewed the patients History and Physical, chart, labs and discussed the procedure including the risks, benefits and alternatives for the proposed anesthesia with the patient or authorized representative who has indicated his/her understanding and acceptance.   Dental advisory given  Plan Discussed with: CRNA  Anesthesia Plan Comments:         Anesthesia Quick Evaluation

## 2015-08-24 NOTE — Progress Notes (Signed)
Pt and family member educated on the need to provide urine sample for UA. Family member stated they forgot when he voided earlier in the morning. RN offered condom cath for better convenience and comfort for pt to provide urine. Pt and family member refused at this time. Nursing will continue to monitor.

## 2015-08-24 NOTE — Care Management Note (Addendum)
Case Management Note  Patient Details  Name: Jeff Hunt MRN: [id number] Date of Birth: 02/27/1925  Subjective/Objective: 82 yo M admitted with a R subcapital femoral neck fracture with increased angulation after falling at home. He is also in rate controlled atrial fibrillation, which is a new diagnosis for him.                Action/Plan: received referral to assist with DME   Expected Discharge Date:                  Expected Discharge Plan:  Skilled Nursing Facility  In-House Referral:     Discharge planning Services  CM Consult  Post Acute Care Choice:    Choice offered to:     DME Arranged:    DME Agency:     HH Arranged:    HH Agency:     Status of Service:     If discussed at [employer] Meetings, dates discussed:    Additional Comments: pt is awaiting for surgery. Will continue to f/u after surgery. Per MD notes, pt may need SNF placement.  Isaias Cowmanliveras-Aizpurua, Moksha Dorgan, RN 08/24/2015, 8:46 AM

## 2015-08-24 NOTE — Consult Note (Signed)
Reason for Consult: Right hip pain Referring Physician: Dr. Twanna Hy is an 82 y.o. male.  HPI: Jeff Hunt is an 82 year old patient who is amatory and walks around a household ambulator level who had a mechanical fall yesterday.  Reports right hip pain.  Denies any loss of consciousness or other orthopedic complaints.  Patient is deaf.  His daughter translates for him.  He is otherwise relatively healthy but was noted have new onset of H will fibrillation at the time of his hospital admission.  No history of DVT or pulmonary embolism  Past Medical History:  Diagnosis Date  . Arthropathy, unspecified, site unspecified   . BPH with obstruction/lower urinary tract symptoms   . Cataract    cortical senile  . Cerebrovascular disease, unspecified   . Chronic renal insufficiency, stage II (mild)   . Chronic rhinitis   . Chronic venous insufficiency   . Coronary atherosclerosis of unspecified type of vessel, native or graft    s/p stent placement "decades ago", hx of cath after this which showed patent stent per pt report.  Marland Kitchen DEPRESSION 12/06/2006   Qualifier: Diagnosis of  By: Quentin Cornwall CMA, Janett Billow    . Depressive disorder, not elsewhere classified   . Hearing loss of both ears   . History of actinic keratoses 08/2011  . History of iron deficiency anemia 2013   Pt declined GI eval on multiple occasions  . Lichen simplex chronicus 08/2011  . Lumbar degenerative disc disease 06/2009   Mild osteoarthritic changes of L-spine diffusely on x-rays  . Memory loss   . Osteoarthritis 06/2009   X-rays: both hips-mild  . Other and unspecified hyperlipidemia   . Rectal bleeding 2015   on/off x 3 mo; saw Dr. Paulita Fujita with [facility]; CT abd/pelvis + Anusol HC supp regimen rx'd.  It appears the CT was ordered but not done (as of 02/20/14)  . TRANSIENT ISCHEMIC ATTACK, HX OF 12/06/2006   Qualifier: Diagnosis of  By: Quentin Cornwall CMA, Janett Billow    . Urticarial dermatitis    Skin biopsy 11/2010 favored  "urticarial allergic rxn related to localized hypersensitivity such as insect bite or arthropod assault"  . Venous stasis dermatitis, left 07/2007   Biopsy showed angiodermatitis, with all tests for infection NEG (fungal stain, AFB stain, and gram stain).    Past Surgical History:  Procedure Laterality Date  . APPENDECTOMY    . CATARACT EXTRACTION  10/24/2007   right  . CATARACT EXTRACTION  11/07/2007   left  . CORONARY ANGIOPLASTY WITH STENT PLACEMENT    . TONSILLECTOMY      Family History  Problem Relation Age of Onset  . Heart disease Father 13  . Hypertension Mother   . Dementia Sister   . Colon cancer Neg Hx     Social History:  reports that he has quit smoking. His smoking use included Pipe. He has never used smokeless tobacco. He reports that he drinks about 10.5 oz of alcohol per week . He reports that he does not use drugs.  Allergies:  Allergies  Allergen Reactions  . Grapefruit Concentrate Other (See Comments)    Cannot take with several of his medications  . Amoxicillin Rash  . Penicillins Rash    Has patient had a PCN reaction causing immediate rash, facial/tongue/throat swelling, SOB or lightheadedness with hypotension: Yes Has patient had a PCN reaction causing severe rash involving mucus membranes or skin necrosis: Unknown Has patient had a PCN reaction that required hospitalization: Unknown Has  patient had a PCN reaction occurring within the last 10 years: No If all of the above answers are "NO", then may proceed with Cephalosporin use.     Medications: I have reviewed the patient's current medications.  Results for orders placed or performed during the hospital encounter of 08/23/15 (from the past 48 hour(s))  CBC with Differential     Status: Abnormal   Collection Time: 08/23/15  8:13 PM  Result Value Ref Range   WBC 9.3 4.0 - 10.5 K/uL   RBC 4.49 4.22 - 5.81 MIL/uL   Hemoglobin 14.1 13.0 - 17.0 g/dL   HCT 44.5 39.0 - 52.0 %   MCV 99.1 78.0 - 100.0  fL   MCH 31.4 26.0 - 34.0 pg   MCHC 31.7 30.0 - 36.0 g/dL   RDW 13.2 11.5 - 15.5 %   Platelets 162 150 - 400 K/uL   Neutrophils Relative % 84 %   Neutro Abs 7.8 (H) 1.7 - 7.7 K/uL   Lymphocytes Relative 8 %   Lymphs Abs 0.7 0.7 - 4.0 K/uL   Monocytes Relative 7 %   Monocytes Absolute 0.7 0.1 - 1.0 K/uL   Eosinophils Relative 1 %   Eosinophils Absolute 0.1 0.0 - 0.7 K/uL   Basophils Relative 0 %   Basophils Absolute 0.0 0.0 - 0.1 K/uL  Comprehensive metabolic panel     Status: Abnormal   Collection Time: 08/23/15  8:13 PM  Result Value Ref Range   Sodium 139 135 - 145 mmol/L   Potassium 4.0 3.5 - 5.1 mmol/L   Chloride 112 (H) 101 - 111 mmol/L   CO2 20 (L) 22 - 32 mmol/L   Glucose, Bld 116 (H) 65 - 99 mg/dL   BUN 15 6 - 20 mg/dL   Creatinine, Ser 0.98 0.61 - 1.24 mg/dL   Calcium 9.1 8.9 - 10.3 mg/dL   Total Protein 6.9 6.5 - 8.1 g/dL   Albumin 4.0 3.5 - 5.0 g/dL   AST 27 15 - 41 U/L   ALT 22 17 - 63 U/L   Alkaline Phosphatase 76 38 - 126 U/L   Total Bilirubin 0.4 0.3 - 1.2 mg/dL   GFR calc non Af Amer >60 >60 mL/min   GFR calc Af Amer >60 >60 mL/min    Comment: (NOTE) The eGFR has been calculated using the CKD EPI equation. This calculation has not been validated in all clinical situations. eGFR's persistently <60 mL/min signify possible Chronic Kidney Disease.    Anion gap 7 5 - 15  Protime-INR     Status: None   Collection Time: 08/23/15 10:43 PM  Result Value Ref Range   Prothrombin Time 13.9 11.4 - 15.2 seconds   INR 1.07   APTT     Status: None   Collection Time: 08/23/15 10:43 PM  Result Value Ref Range   aPTT 34 24 - 36 seconds  TSH     Status: None   Collection Time: 08/23/15 10:43 PM  Result Value Ref Range   TSH 1.621 0.350 - 4.500 uIU/mL  T4, free     Status: None   Collection Time: 08/23/15 10:43 PM  Result Value Ref Range   Free T4 0.79 0.61 - 1.12 ng/dL    Comment: (NOTE) Biotin ingestion may interfere with free T4 tests. If the results  are inconsistent with the TSH level, previous test results, or the clinical presentation, then consider biotin interference. If needed, order repeat testing after stopping biotin.   Troponin I  Status: None   Collection Time: 08/23/15 10:43 PM  Result Value Ref Range   Troponin I <0.03 <0.03 ng/mL  Brain natriuretic peptide     Status: None   Collection Time: 08/23/15 10:43 PM  Result Value Ref Range   B Natriuretic Peptide 96.0 0.0 - 100.0 pg/mL  Type and screen [facility]     Status: None   Collection Time: 08/23/15 10:55 PM  Result Value Ref Range   ABO/RH(D) O POS    Antibody Screen NEG    Sample Expiration 08/26/2015   ABO/Rh     Status: None   Collection Time: 08/23/15 10:55 PM  Result Value Ref Range   ABO/RH(D) O POS   CBC     Status: Abnormal   Collection Time: 08/24/15  4:10 AM  Result Value Ref Range   WBC 7.4 4.0 - 10.5 K/uL   RBC 4.12 (L) 4.22 - 5.81 MIL/uL   Hemoglobin 12.7 (L) 13.0 - 17.0 g/dL   HCT 40.3 39.0 - 52.0 %   MCV 97.8 78.0 - 100.0 fL   MCH 30.8 26.0 - 34.0 pg   MCHC 31.5 30.0 - 36.0 g/dL   RDW 13.2 11.5 - 15.5 %   Platelets 148 (L) 150 - 400 K/uL  Basic metabolic panel     Status: Abnormal   Collection Time: 08/24/15  4:10 AM  Result Value Ref Range   Sodium 138 135 - 145 mmol/L   Potassium 4.0 3.5 - 5.1 mmol/L   Chloride 113 (H) 101 - 111 mmol/L   CO2 20 (L) 22 - 32 mmol/L   Glucose, Bld 156 (H) 65 - 99 mg/dL   BUN 17 6 - 20 mg/dL   Creatinine, Ser 0.92 0.61 - 1.24 mg/dL   Calcium 8.8 (L) 8.9 - 10.3 mg/dL   GFR calc non Af Amer >60 >60 mL/min   GFR calc Af Amer >60 >60 mL/min    Comment: (NOTE) The eGFR has been calculated using the CKD EPI equation. This calculation has not been validated in all clinical situations. eGFR's persistently <60 mL/min signify possible Chronic Kidney Disease.    Anion gap 5 5 - 15  Troponin I     Status: None   Collection Time: 08/24/15  4:10 AM  Result Value Ref Range   Troponin I  <0.03 <0.03 ng/mL  Surgical pcr screen     Status: Abnormal   Collection Time: 08/24/15  5:33 AM  Result Value Ref Range   MRSA, PCR NEGATIVE NEGATIVE   Staphylococcus aureus POSITIVE (A) NEGATIVE    Comment:        The Xpert SA Assay (FDA approved for NASAL specimens in patients over 76 years of age), is one component of a comprehensive surveillance program.  Test performance has been validated by [facility] for patients greater than or equal to 86 year old. It is not intended to diagnose infection nor to guide or monitor treatment.   Troponin I     Status: None   Collection Time: 08/24/15 10:19 AM  Result Value Ref Range   Troponin I <0.03 <0.03 ng/mL  Urinalysis, Routine w reflex microscopic (not at [facility])     Status: Abnormal   Collection Time: 08/24/15 10:23 AM  Result Value Ref Range   Color, Urine YELLOW YELLOW   APPearance CLOUDY (A) CLEAR   Specific Gravity, Urine 1.020 1.005 - 1.030   pH 7.0 5.0 - 8.0   Glucose, UA NEGATIVE NEGATIVE mg/dL  Hgb urine dipstick NEGATIVE NEGATIVE   Bilirubin Urine NEGATIVE NEGATIVE   Ketones, ur NEGATIVE NEGATIVE mg/dL   Protein, ur NEGATIVE NEGATIVE mg/dL   Nitrite NEGATIVE NEGATIVE   Leukocytes, UA SMALL (A) NEGATIVE  Urine microscopic-add on     Status: Abnormal   Collection Time: 08/24/15 10:23 AM  Result Value Ref Range   Squamous Epithelial / LPF 0-5 (A) NONE SEEN   WBC, UA 0-5 0 - 5 WBC/hpf   RBC / HPF TOO NUMEROUS TO COUNT 0 - 5 RBC/hpf   Bacteria, UA NONE SEEN NONE SEEN    Dg Elbow Complete Right (3+view)  Result Date: 08/23/2015 CLINICAL DATA:  Fall today. Right hip fracture. Abrasion to the right elbow. EXAM: RIGHT ELBOW - COMPLETE 3+ VIEW COMPARISON:  None. FINDINGS: Right elbow is located. There is no significant effusion. No acute fracture is present. Soft tissue swelling is present posterior to the olecranon. IMPRESSION: Soft tissue swelling posterior to the elbow without underlying fracture. Electronically Signed    By: San Morelle M.D.   On: 08/23/2015 19:14   Chest Portable 1 View  Result Date: 08/23/2015 CLINICAL DATA:  Fall EXAM: PORTABLE CHEST 1 VIEW COMPARISON:  05/25/2011 FINDINGS: Cardiomediastinal silhouette is stable. No acute infiltrate or pleural effusion. No pulmonary edema. Stable left basilar scarring. IMPRESSION: No active disease. Electronically Signed   By: Lahoma Crocker M.D.   On: 08/23/2015 22:13   Dg Hip Unilat With Pelvis 2-3 Views Right  Result Date: 08/23/2015 CLINICAL DATA:  Fall this afternoon while taking out the trash. Acute onset of right hip pain. EXAM: DG HIP (WITH OR WITHOUT PELVIS) 2-3V RIGHT COMPARISON:  None. FINDINGS: A right subcapital femoral neck fracture is present. Increased varus angulation and slight foreshortening is present. The femoral head is located. The pelvis is otherwise intact. Degenerative changes are present in the lower lumbar spine. IMPRESSION: 1. Right subcapital femoral neck fracture with increased angulation. Electronically Signed   By: San Morelle M.D.   On: 08/23/2015 19:13    Review of Systems  Constitutional: Negative.   HENT: Positive for hearing loss.   Eyes: Negative.   Cardiovascular: Negative.   Gastrointestinal: Negative.   Genitourinary: Negative.   Musculoskeletal: Positive for joint pain.  Skin: Negative.   Neurological: Negative.   Endo/Heme/Allergies: Negative.   Psychiatric/Behavioral: Negative.    Blood pressure 118/66, pulse 84, temperature 98.3 F (36.8 C), temperature source Oral, resp. rate 20, height 5' 11"  (1.803 m), weight 81.6 kg (180 lb), SpO2 95 %. Physical Exam  Constitutional: He appears well-developed.  HENT:  Head: Normocephalic.  Eyes: Pupils are equal, round, and reactive to light.  Neck: Normal range of motion.  Cardiovascular: Normal rate.   Respiratory: Effort normal.  Neurological: He is alert.  Skin: Skin is warm.  Psychiatric: He has a normal mood and affect.   Patient has  slightly shortened right lower extremity but with palpable pedal pulses and intact ankle dorsi flexion and plantar flexion.  Knees and ankles have no crepitus or effusion with range of motion.  Bilateral upper chemise have reasonable grip strength with good wrist elbow and shoulder range of motion without bruising ecchymosis or crepitus of those joints.  Neck is nontender.  Radial pulses intact bilaterally. Assessment/Plan: Impression is displaced right femoral neck fracture in an otherwise healthy patient.  Troponin levels have been normal less far.  New-onset A. fib is then evaluated.  Beta blockers recommended postop along with DVT prophylaxis.  Risks and benefits of surgery  explained today including not limited to infection or vessel damage leg length inequality dislocation as well as perioperative complications including stroke heart attack other medical complications.  Patient understands risks and benefits.  All questions answered  Jeff Hunt 08/24/2015, 1:24 PM

## 2015-08-25 ENCOUNTER — Inpatient Hospital Stay (HOSPITAL_COMMUNITY): Payer: Medicare Other

## 2015-08-25 DIAGNOSIS — G459 Transient cerebral ischemic attack, unspecified: Secondary | ICD-10-CM

## 2015-08-25 DIAGNOSIS — I4891 Unspecified atrial fibrillation: Secondary | ICD-10-CM

## 2015-08-25 LAB — VAS US CAROTID
LCCADDIAS: -11 cm/s
LCCAPSYS: 73 cm/s
LEFT ECA DIAS: -11 cm/s
LEFT VERTEBRAL DIAS: 12 cm/s
LICADDIAS: -25 cm/s
LICADSYS: -100 cm/s
LICAPSYS: 78 cm/s
Left CCA dist sys: -65 cm/s
Left CCA prox dias: 12 cm/s
Left ICA prox dias: 18 cm/s
RIGHT ECA DIAS: 12 cm/s
RIGHT VERTEBRAL DIAS: 11 cm/s
Right CCA prox dias: 6 cm/s
Right CCA prox sys: 74 cm/s
Right cca dist sys: -104 cm/s

## 2015-08-25 LAB — ECHOCARDIOGRAM COMPLETE
Height: 71 in
WEIGHTICAEL: 2878.33 [oz_av]

## 2015-08-25 LAB — CBC
HCT: 37.7 % — ABNORMAL LOW (ref 39.0–52.0)
Hemoglobin: 11.9 g/dL — ABNORMAL LOW (ref 13.0–17.0)
MCH: 30.9 pg (ref 26.0–34.0)
MCHC: 31.6 g/dL (ref 30.0–36.0)
MCV: 97.9 fL (ref 78.0–100.0)
PLATELETS: 116 10*3/uL — AB (ref 150–400)
RBC: 3.85 MIL/uL — ABNORMAL LOW (ref 4.22–5.81)
RDW: 13.4 % (ref 11.5–15.5)
WBC: 7.1 10*3/uL (ref 4.0–10.5)

## 2015-08-25 LAB — BASIC METABOLIC PANEL
Anion gap: 4 — ABNORMAL LOW (ref 5–15)
BUN: 16 mg/dL (ref 6–20)
CHLORIDE: 112 mmol/L — AB (ref 101–111)
CO2: 20 mmol/L — AB (ref 22–32)
CREATININE: 0.97 mg/dL (ref 0.61–1.24)
Calcium: 8.1 mg/dL — ABNORMAL LOW (ref 8.9–10.3)
GFR calc Af Amer: >60 mL/min (ref >60)
GFR calc non Af Amer: >60 mL/min (ref >60)
GLUCOSE: 145 mg/dL — AB (ref 65–99)
Potassium: 4.1 mmol/L (ref 3.5–5.1)
SODIUM: 136 mmol/L (ref 135–145)

## 2015-08-25 LAB — HEMOGLOBIN A1C
Hgb A1c MFr Bld: 5.5 % (ref 4.8–5.6)
Mean Plasma Glucose: 111 mg/dL

## 2015-08-25 NOTE — Progress Notes (Signed)
  Echocardiogram 2D Echocardiogram has been performed.  Arvil ChacoFoster, Jeanene Mena 08/25/2015, 3:35 PM

## 2015-08-25 NOTE — Progress Notes (Signed)
Subjective: Patient stable pain controlled.  He pulled his dressing off this morning he is a little confused   Objective: Vital signs in last 24 hours: Temp:  [97.9 F (36.6 C)-99.2 F (37.3 C)] 99.2 F (37.3 C) (08/20 0730) Pulse Rate:  [64-130] 98 (08/20 0730) Resp:  [15-25] 25 (08/20 0730) BP: (101-127)/(53-90) 124/68 (08/20 0730) SpO2:  [88 %-99 %] 93 % (08/20 0730) Weight:  [81.6 kg (179 lb 14.3 oz)] 81.6 kg (179 lb 14.3 oz) (08/19 2100)  Intake/Output from previous day: 08/19 0701 - 08/20 0700 In: 2887.5 [I.V.:2837.5; IV Piggyback:50] Out: 800 [Urine:550; Blood:250] Intake/Output this shift: Total I/O In: 362.1 [P.O.:120; I.V.:242.1] Out: -   Exam:  Sensation intact distally Intact pulses distally Dorsiflexion/Plantar flexion intact  Labs:  Recent Labs  08/23/15 2013 08/24/15 0410 08/25/15 0839  HGB 14.1 12.7* 11.9*    Recent Labs  08/24/15 0410 08/25/15 0839  WBC 7.4 7.1  RBC 4.12* 3.85*  HCT 40.3 37.7*  PLT 148* 116*    Recent Labs  08/24/15 0410 08/25/15 0839  NA 138 136  K 4.0 4.1  CL 113* 112*  CO2 20* 20*  BUN 17 16  CREATININE 0.92 0.97  GLUCOSE 156* 145*  CALCIUM 8.8* 8.1*    Recent Labs  08/23/15 2243  INR 1.07    Assessment/Plan: Plan at this time is to mobilize with physical therapy.  I do want to keep the incision covered 's here in the hospital.  He will need to continue to try to mobilize while he is in the hospital.   Rockefeller University HospitalDEAN,GREGORY SCOTT 08/25/2015, 11:03 AM

## 2015-08-25 NOTE — Progress Notes (Signed)
RN tried calling MD August Saucerean regarding patients dressing being removed, but no answer. Day shift RN said she would relay message to MD.

## 2015-08-25 NOTE — Evaluation (Signed)
Physical Therapy Evaluation Patient Details Name: Jeff Hunt MRN: [id number] DOB: 10/08/1925 Today's Date: 08/25/2015   History of Present Illness  Patient is an 82 yo male admitted 08/23/15 after a fall with Rt displaced femoral neck fracture.  Patient s/p Rt hemiarthroplasty on 08/24/15.  New diagnosis of Afib with RVR.    PMH:  CAD, CKD, anemia, chronic LE edema, HOH, memory loss  Clinical Impression  Patient presents with problems listed below.  Will benefit from acute PT to maximize functional mobility prior to d/c.  Patient independent pta, and required +2 total assist for mobility/stance.  Unable to ambulate.  Recommend SNF at discharge for continued therapy.    Follow Up Recommendations SNF;Supervision/Assistance - 24 hour    Equipment Recommendations  Rolling walker with 5" wheels;3in1 (PT)    Recommendations for Other Services       Precautions / Restrictions Precautions Precautions: Fall Precaution Comments: Direct anterior approach - No hip precautions Restrictions Weight Bearing Restrictions: Yes RLE Weight Bearing: Weight bearing as tolerated      Mobility  Bed Mobility Overal bed mobility: Needs Assistance;+2 for physical assistance Bed Mobility: Supine to Sit;Sit to Supine     Supine to sit: Total assist;+2 for physical assistance Sit to supine: Total assist;+2 for physical assistance   General bed mobility comments: Patient unable to initiate movement to EOB due to pain.  Required +2 total assist to move supine <> sit.  Transfers Overall transfer level: Needs assistance Equipment used: Rolling walker (2 wheeled) (Stedy) Transfers: Sit to/from RaytheonStand;Stand Pivot Transfers Sit to Stand: Max assist;Total assist;+2 physical assistance;From elevated surface Stand pivot transfers: Total assist;+2 physical assistance       General transfer comment: Verbal and tactile cues for hand placement and technique.  Patient able to move to standing with +2 max  assist.  Attempted to take steps to pivot to chair, but unable to move either foot.  HR increased to 139 and O2 sats dropped to 87% on O2.  Returned to sitting for rest break.  Attempted to stand with Stedy lift equipment and +2 max assist.  Patient achieved partial stance and abruptly returned to sitting due to pain.  Unable to get Stedy pads behind patient.  HR increased again with effort.  Patient returned to supine.  RN aware - in and out of room.  Ambulation/Gait             General Gait Details: Unable  Stairs            Wheelchair Mobility    Modified Rankin (Stroke Patients Only)       Balance Overall balance assessment: Needs assistance Sitting-balance support: Bilateral upper extremity supported;Feet supported Sitting balance-Leahy Scale: Fair     Standing balance support: Bilateral upper extremity supported Standing balance-Leahy Scale: Zero                               Pertinent Vitals/Pain Pain Assessment: 0-10 Pain Score: 8  Pain Location: Rt hip Pain Descriptors / Indicators: Grimacing;Guarding;Sharp Pain Intervention(s): Limited activity within patient's tolerance;Monitored during session;Repositioned;Patient requesting pain meds-RN notified;RN gave pain meds during session    Home Living Family/patient expects to be discharged to:: Skilled nursing facility                 Additional Comments: Patient lives alone.  Adopted daughter is with patient "half of the time" per daughter.    Prior Function Level of  Independence: Independent               Hand Dominance        Extremity/Trunk Assessment   Upper Extremity Assessment: Generalized weakness           Lower Extremity Assessment: Generalized weakness;RLE deficits/detail RLE Deficits / Details: Decreased strength and ROM due to pain       Communication   Communication: HOH  Cognition Arousal/Alertness: Awake/alert Behavior During Therapy: WFL for tasks  assessed/performed Overall Cognitive Status: Within Functional Limits for tasks assessed (Appears WFL - difficult to assess with HOH)                      General Comments General comments (skin integrity, edema, etc.): Noted edema BLE's    Exercises        Assessment/Plan    PT Assessment Patient needs continued PT services  PT Diagnosis Difficulty walking;Generalized weakness;Acute pain   PT Problem List Decreased strength;Decreased range of motion;Decreased activity tolerance;Decreased balance;Decreased mobility;Decreased knowledge of use of DME;Decreased knowledge of precautions;Cardiopulmonary status limiting activity;Obesity;Pain  PT Treatment Interventions DME instruction;Gait training;Functional mobility training;Therapeutic activities;Therapeutic exercise;Patient/family education   PT Goals (Current goals can be found in the Care Plan section) Acute Rehab PT Goals Patient Stated Goal: Decrease pain PT Goal Formulation: With patient/family Time For Goal Achievement: 09/08/15 Potential to Achieve Goals: Good    Frequency Min 5X/week   Barriers to discharge Decreased caregiver support Patient lives alone    Co-evaluation               End of Session Equipment Utilized During Treatment: Gait belt;Oxygen Activity Tolerance: Patient limited by fatigue;Patient limited by pain Patient left: in bed;with call bell/phone within reach;with bed alarm set;with family/visitor present;with SCD's reapplied Nurse Communication: Mobility status;Need for lift equipment;Patient requests pain meds (Increase in HR with standing effort)         Time: 1345-1415 PT Time Calculation (min) (ACUTE ONLY): 30 min   Charges:   PT Evaluation $PT Eval High Complexity: 1 Procedure PT Treatments $Therapeutic Activity: 8-22 mins   PT G Codes:        Vena AustriaDavis, Siarah Deleo H 08/25/2015, 6:03 PM Durenda HurtSusan H. Renaldo Fiddleravis, PT, [facility] HospitalMBA Acute Rehab Services Pager [phone]

## 2015-08-25 NOTE — Progress Notes (Signed)
Patients daughter came out of room and told RN that patient took his dressing off his incision site. RN assessed site, it was clean, and dry. RN covered with telfa dressing and 3x tegaderm dressings. This happened right at shift change, day shift RN made aware.

## 2015-08-25 NOTE — Progress Notes (Signed)
Patients "daughter" came out and said patient was saying there was an emergency. RN went into patients room to find out what was happening, patient confused and stating "we need to evacuate this building before the man on the computer blows this place up. They have the cure to make people live longer, but only give it to people of power and money." RN asked the patient orientation questions and he was able to answer all questions without a problem, but kept talking about needing to get out before we all die. He looked at his "daughter" and told her to leave so she could make it out alive. Patient was very frustrated that we didn't know what he was talking about. RN tried to explain to him that there was no such thing occurring, but patient just felt like RN was not taking him seriously. Patients daughter told RN that he used to work for the [employer] service, and dreams about this kind of stuff often but she normally is not there right when he wakes up to know if this is normal behavior for him when dreaming. RN stayed in room with patient for about 20 minutes, and tried to change topics, once RN able to get patients attention onto a different topic, he didn't bring it back up. RN will continue to monitor patient.

## 2015-08-25 NOTE — Progress Notes (Signed)
PROGRESS NOTE    Jeff Hunt  WUJ:[id number] DOB: 01/17/1925 DOA: 08/23/2015 PCP: Ginette OttoSTONEKING,HAL THOMAS, MD    Brief Narrative:  82yo man presented after a mechanical fall at home.  He was diagnosed with a right subcapital femoral neck fracture.  On admission he was found to be in Afib with RVR, this is a new diagnosis, but unclear duration given last EKG in the system is from 2014.    He reports no history of MI, CAD, Heart failure or TIA, though they are listed in his history.  Orthopedics has evaluated him for surgery.  He is on acetazolamide for reported chronic LE edema.    Assessment & Plan:   Femoral neck fracture - Orthopedic repair on 8/19 - Pending PT evaluation - pain control with morphine and norco PRN  Encephalopathy - Likely multifactorial - age, medications, new environment - Improved with redirection and adopted daughter speaking with him - Monitor closely  Atrial fibrillation - Further work up for new Afib (unclear onset, within last 3 years) ongoing TSH (1.621) and TTE (done today, report pending).  - Telemetry - Currently on diltiazem drip at low dose and metoprolol XR 25 mg - On diltiazem drip post op, continue overnight - Tomorrow, would d/c diltiazem and increase metoprolol to 50mg  XR if needed to get HR < 110.  - CHADS-VASC score of 5 which would favor anticoagulation - Orthopedic surgery ordered rivaroxaban to be started.  - EKG this AM with Afib/flutter.  May consider Cardiology consult for discussion of cardioversion prior to discharge. However, he has had the arrhythmia for an unknown amount of time.  If HR control obtained, could possibly follow up outpatient    Anemia, iron deficiency - Hgb 11.9 post op - Continue iron - Trend    Peripheral edema - Holding acetazolamide in acute setting - Renal function stable.  - Once ambulatory and improving, consider adding back.     HTN (hypertension), benign - BP well controlled from this morning -  Continue metoprolol, tamsulosin - If BP worsens, consider adding amlodipine.  Low Calcium - Recheck CMET in the morning, likely related to low albumin, was normal on admission    DVT prophylaxis: SCDs Code Status: Full Disposition Plan: Likely will need SNF.  Needs PT evaluation   Consultants:   Orthopedics  Procedures:   TTE  Antimicrobials:   None    Subjective: Pain in the hip, hard of hearing.   Making jokes today.  No apparent confusion.     Objective: Vitals:   08/25/15 0000 08/25/15 0400 08/25/15 0730 08/25/15 1116  BP: 103/60 127/65 124/68 126/73  Pulse: 72 73 98 96  Resp: (!) 21 (!) 25 (!) 25 (!) 25  Temp: 99.1 F (37.3 C) 98.1 F (36.7 C) 99.2 F (37.3 C) 98.6 F (37 C)  TempSrc: Oral Oral Oral Oral  SpO2: (!) 88% 94% 93% 93%  Weight:      Height:        Intake/Output Summary (Last 24 hours) at 08/25/15 1536 Last data filed at 08/25/15 1500  Gross per 24 hour  Intake          2929.59 ml  Output             1100 ml  Net          1829.59 ml   Filed Weights   08/23/15 1928 08/24/15 2100  Weight: 180 lb (81.6 kg) 179 lb 14.3 oz (81.6 kg)    Examination:  General  exam: Appears calm and comfortable, very hard of hearing.  Respiratory system: Clear to auscultation. Respiratory effort normal. Cardiovascular system: S1 & S2 heard, irreg irreg Gastrointestinal system: Abdomen is nondistended, soft and nontender. +BS Central nervous system: Alert and oriented. No gross neurological deficits.  Very hard of hearing Extremities: Bandage over right anterior hip, clean dry.  Moving extremities well.  Pulses intact in bilateral DP Skin: No rashes, lesions or ulcers Psychiatry: Judgement and insight appear normal. Mood & affect appropriate.     Data Reviewed:  CBC:  Recent Labs Lab 08/23/15 2013 08/24/15 0410 08/25/15 0839  WBC 9.3 7.4 7.1  NEUTROABS 7.8*  --   --   HGB 14.1 12.7* 11.9*  HCT 44.5 40.3 37.7*  MCV 99.1 97.8 97.9  PLT 162 148*  116*   Basic Metabolic Panel:  Recent Labs Lab 08/23/15 2013 08/24/15 0410 08/25/15 0839  NA 139 138 136  K 4.0 4.0 4.1  CL 112* 113* 112*  CO2 20* 20* 20*  GLUCOSE 116* 156* 145*  BUN 15 17 16   CREATININE 0.98 0.92 0.97  CALCIUM 9.1 8.8* 8.1*   GFR: Estimated Creatinine Clearance: 56.1 mL/min (by C-G formula based on SCr of 0.97 mg/dL). Liver Function Tests:  Recent Labs Lab 08/23/15 2013  AST 27  ALT 22  ALKPHOS 76  BILITOT 0.4  PROT 6.9  ALBUMIN 4.0   No results for input(s): LIPASE, AMYLASE in the last 168 hours. No results for input(s): AMMONIA in the last 168 hours. Coagulation Profile:  Recent Labs Lab 08/23/15 2243  INR 1.07   Cardiac Enzymes:  Recent Labs Lab 08/23/15 2243 08/24/15 0410 08/24/15 1019  TROPONINI <0.03 <0.03 <0.03   BNP (last 3 results) No results for input(s): PROBNP in the last 8760 hours. HbA1C:  Recent Labs  08/24/15 0410  HGBA1C 5.5   CBG: No results for input(s): GLUCAP in the last 168 hours. Lipid Profile: No results for input(s): CHOL, HDL, LDLCALC, TRIG, CHOLHDL, LDLDIRECT in the last 72 hours. Thyroid Function Tests:  Recent Labs  08/23/15 2243  TSH 1.621  FREET4 0.79   Anemia Panel: No results for input(s): VITAMINB12, FOLATE, FERRITIN, TIBC, IRON, RETICCTPCT in the last 72 hours. Sepsis Labs: No results for input(s): PROCALCITON, LATICACIDVEN in the last 168 hours.  Recent Results (from the past 240 hour(s))  Surgical pcr screen     Status: Abnormal   Collection Time: 08/24/15  5:33 AM  Result Value Ref Range Status   MRSA, PCR NEGATIVE NEGATIVE Final   Staphylococcus aureus POSITIVE (A) NEGATIVE Final    Comment:        The Xpert SA Assay (FDA approved for NASAL specimens in patients over 42 years of age), is one component of a comprehensive surveillance program.  Test performance has been validated by [facility] for patients greater than or equal to 49 year old. It is not intended to  diagnose infection nor to guide or monitor treatment.          Radiology Studies: Dg Elbow Complete Right (3+view)  Result Date: 08/23/2015 CLINICAL DATA:  Fall today. Right hip fracture. Abrasion to the right elbow. EXAM: RIGHT ELBOW - COMPLETE 3+ VIEW COMPARISON:  None. FINDINGS: Right elbow is located. There is no significant effusion. No acute fracture is present. Soft tissue swelling is present posterior to the olecranon. IMPRESSION: Soft tissue swelling posterior to the elbow without underlying fracture. Electronically Signed   By: Marin Roberts M.D.   On: 08/23/2015 19:14  Chest Portable 1 View  Result Date: 08/23/2015 CLINICAL DATA:  Fall EXAM: PORTABLE CHEST 1 VIEW COMPARISON:  05/25/2011 FINDINGS: Cardiomediastinal silhouette is stable. No acute infiltrate or pleural effusion. No pulmonary edema. Stable left basilar scarring. IMPRESSION: No active disease. Electronically Signed   By: Natasha MeadLiviu  Pop M.D.   On: 08/23/2015 22:13   Dg C-arm 61-120 Min  Result Date: 08/24/2015 CLINICAL DATA:  Right hip arthroplasty EXAM: OPERATIVE RIGHT HIP WITH PELVIS; DG C-ARM 61-120 MIN COMPARISON:  None. FLUOROSCOPY TIME:  Radiation Exposure Index (as provided by the fluoroscopic device): Not available If the device does not provide the exposure index: Fluoroscopy Time:  18 seconds Number of Acquired Images:  2 FINDINGS: Right hip arthroplasty is noted in satisfactory position. No acute soft tissue or bony abnormality is noted. IMPRESSION: Status post right hip replacement Electronically Signed   By: Alcide CleverMark  Lukens M.D.   On: 08/24/2015 16:55   Dg Hip Port Unilat With Pelvis 1v Right  Result Date: 08/24/2015 CLINICAL DATA:  hip fracture, postop EXAM: DG HIP (WITH OR WITHOUT PELVIS) 1V PORT RIGHT COMPARISON:  Intraoperative film same day FINDINGS: Two views of the right hip submitted included frontal view of the pelvis. There is right hip prosthesis with anatomic alignment. Lateral skin staples are  noted. Postsurgical changes with periarticular soft tissue air. IMPRESSION: Right hip prosthesis with anatomic alignment. Electronically Signed   By: Natasha MeadLiviu  Pop M.D.   On: 08/24/2015 17:26   Dg Hip Operative Unilat W Or W/o Pelvis Right  Result Date: 08/24/2015 CLINICAL DATA:  Right hip arthroplasty EXAM: OPERATIVE RIGHT HIP WITH PELVIS; DG C-ARM 61-120 MIN COMPARISON:  None. FLUOROSCOPY TIME:  Radiation Exposure Index (as provided by the fluoroscopic device): Not available If the device does not provide the exposure index: Fluoroscopy Time:  18 seconds Number of Acquired Images:  2 FINDINGS: Right hip arthroplasty is noted in satisfactory position. No acute soft tissue or bony abnormality is noted. IMPRESSION: Status post right hip replacement Electronically Signed   By: Alcide CleverMark  Lukens M.D.   On: 08/24/2015 16:55   Dg Hip Unilat With Pelvis 2-3 Views Right  Result Date: 08/23/2015 CLINICAL DATA:  Fall this afternoon while taking out the trash. Acute onset of right hip pain. EXAM: DG HIP (WITH OR WITHOUT PELVIS) 2-3V RIGHT COMPARISON:  None. FINDINGS: A right subcapital femoral neck fracture is present. Increased varus angulation and slight foreshortening is present. The femoral head is located. The pelvis is otherwise intact. Degenerative changes are present in the lower lumbar spine. IMPRESSION: 1. Right subcapital femoral neck fracture with increased angulation. Electronically Signed   By: Marin Robertshristopher  Mattern M.D.   On: 08/23/2015 19:13   Scheduled Meds: . atorvastatin  40 mg Oral q1800  . citalopram  20 mg Oral Daily  . feeding supplement (ENSURE ENLIVE)  237 mL Oral Q1500  . ferrous sulfate  325 mg Oral BID  . fluticasone  1 spray Each Nare Daily  . metoprolol succinate  25 mg Oral Daily  . mirabegron ER  25 mg Oral Daily  . pantoprazole  40 mg Oral Daily  . polycarbophil  625 mg Oral Daily  . rivaroxaban  10 mg Oral Q breakfast  . tamsulosin  0.4 mg Oral Daily  . tranexamic acid  (CYKLOKAPRON) topical -INTRAOP  2,000 mg Topical Once   Continuous Infusions: . 0.9 % NaCl with KCl 20 mEq / L 75 mL/hr at 08/25/15 1500  . diltiazem (CARDIZEM) infusion 5 mg/hr (08/25/15 1500)  LOS: 2 days    Time spent: 25 minutes    Debe Coder, MD [facility] Pager [phone]  If 7PM-7AM, please contact night-coverage [email or web address] Password [facility] 08/25/2015, 3:36 PM

## 2015-08-25 NOTE — Progress Notes (Signed)
*  PRELIMINARY RESULTS* Vascular Ultrasound Carotid Duplex (Doppler) has been completed.  Preliminary findings: Bilateral: No significant (1-39%) ICA stenosis. Antegrade vertebral flow.   Farrel DemarkJill Eunice, RDMS, RVT  08/25/2015, 10:54 AM

## 2015-08-26 ENCOUNTER — Encounter (HOSPITAL_COMMUNITY): Payer: Self-pay | Admitting: Orthopedic Surgery

## 2015-08-26 LAB — COMPREHENSIVE METABOLIC PANEL
ALK PHOS: 57 U/L (ref 38–126)
ALT: 13 U/L — AB (ref 17–63)
AST: 21 U/L (ref 15–41)
Albumin: 2.7 g/dL — ABNORMAL LOW (ref 3.5–5.0)
Anion gap: 5 (ref 5–15)
BILIRUBIN TOTAL: 1.5 mg/dL — AB (ref 0.3–1.2)
BUN: 15 mg/dL (ref 6–20)
CALCIUM: 8 mg/dL — AB (ref 8.9–10.3)
CO2: 20 mmol/L — ABNORMAL LOW (ref 22–32)
CREATININE: 0.88 mg/dL (ref 0.61–1.24)
Chloride: 113 mmol/L — ABNORMAL HIGH (ref 101–111)
Glucose, Bld: 126 mg/dL — ABNORMAL HIGH (ref 65–99)
Potassium: 3.8 mmol/L (ref 3.5–5.1)
Sodium: 138 mmol/L (ref 135–145)
TOTAL PROTEIN: 5.5 g/dL — AB (ref 6.5–8.1)

## 2015-08-26 LAB — CBC
HCT: 36.6 % — ABNORMAL LOW (ref 39.0–52.0)
Hemoglobin: 11.2 g/dL — ABNORMAL LOW (ref 13.0–17.0)
MCH: 30.1 pg (ref 26.0–34.0)
MCHC: 30.6 g/dL (ref 30.0–36.0)
MCV: 98.4 fL (ref 78.0–100.0)
PLATELETS: 119 10*3/uL — AB (ref 150–400)
RBC: 3.72 MIL/uL — AB (ref 4.22–5.81)
RDW: 13.3 % (ref 11.5–15.5)
WBC: 7.3 10*3/uL (ref 4.0–10.5)

## 2015-08-26 MED ORDER — DILTIAZEM HCL ER COATED BEADS 180 MG PO CP24: 180.0000 mg | ORAL_CAPSULE | Freq: Every day | ORAL | Status: DC

## 2015-08-26 MED ORDER — DILTIAZEM HCL ER COATED BEADS 120 MG PO CP24
120.0000 mg | ORAL_CAPSULE | Freq: Every day | ORAL | Status: DC
  Administered 2015-08-26 – 2015-08-29 (×4): 120 mg via ORAL
  Filled 2015-08-26 (×4): qty 1

## 2015-08-26 MED ORDER — CETYLPYRIDINIUM CHLORIDE 0.05 % MT LIQD
7.0000 mL | Freq: Two times a day (BID) | OROMUCOSAL | Status: DC
  Administered 2015-08-26 – 2015-08-30 (×8): 7 mL via OROMUCOSAL

## 2015-08-26 NOTE — Discharge Instructions (Addendum)
Information on my medicine - XARELTO (Rivaroxaban)  This medication education was reviewed with me or my healthcare representative as part of my discharge preparation.  The pharmacist that spoke with me during my hospital stay was:  Harland GermanAndrew Meyer, Pharm D  Why was Xarelto prescribed for you? Xarelto was prescribed for you to reduce the risk of a blood clot forming that can cause a stroke if you have a medical condition called atrial fibrillation (a type of irregular heartbeat).  What do you need to know about xarelto ? Take your Xarelto ONCE DAILY at the same time every day with your evening meal. If you have difficulty swallowing the tablet whole, you may crush it and mix in applesauce just prior to taking your dose.  Take Xarelto exactly as prescribed by your doctor and DO NOT stop taking Xarelto without talking to the doctor who prescribed the medication.  Stopping without other stroke prevention medication to take the place of Xarelto may increase your risk of developing a clot that causes a stroke.  Refill your prescription before you run out.  After discharge, you should have regular check-up appointments with your healthcare provider that is prescribing your Xarelto.  In the future your dose may need to be changed if your kidney function or weight changes by a significant amount.  What do you do if you miss a dose? If you are taking Xarelto ONCE DAILY and you miss a dose, take it as soon as you remember on the same day then continue your regularly scheduled once daily regimen the next day. Do not take two doses of Xarelto at the same time or on the same day.   Important Safety Information A possible side effect of Xarelto is bleeding. You should call your healthcare provider right away if you experience any of the following: ? Bleeding from an injury or your nose that does not stop. ? Unusual colored urine (red or dark brown) or unusual colored stools (red or black). ? Unusual  bruising for unknown reasons. ? A serious fall or if you hit your head (even if there is no bleeding).  Some medicines may interact with Xarelto and might increase your risk of bleeding while on Xarelto. To help avoid this, consult your healthcare provider or pharmacist prior to using any new prescription or non-prescription medications, including herbals, vitamins, non-steroidal anti-inflammatory drugs (NSAIDs) and supplements.  This website has more information on Xarelto: [email or web address].

## 2015-08-26 NOTE — Care Management Note (Signed)
Case Management Note  Patient Details  Name: Jeff Hunt Outman MRN: [id number] Date of Birth: 07/31/1925  Subjective/Objective:   Patient is s/p Right hip hemiarthroplasty, lives home alone, per pt eval rec snf, CSW referral.                 Action/Plan:   Expected Discharge Date:                  Expected Discharge Plan:  Skilled Nursing Facility  In-House Referral:     Discharge planning Services  CM Consult  Post Acute Care Choice:    Choice offered to:     DME Arranged:    DME Agency:     HH Arranged:    HH Agency:     Status of Service:     If discussed at [employer] Meetings, dates discussed:    Additional Comments:  [name], Jeneal Vogl Clinton, RN 08/26/2015, 10:41 AM

## 2015-08-26 NOTE — Progress Notes (Signed)
PROGRESS NOTE    Jeff Hunt  ZOX:[id number] DOB: 05/09/1925 DOA: 08/23/2015 PCP: Ginette OttoSTONEKING,HAL THOMAS, MD    Brief Narrative:  82yo man presented after a mechanical fall at home.  He was diagnosed with a right subcapital femoral neck fracture.  On admission he was found to be in Afib with RVR, this is a new diagnosis, but unclear duration given last EKG in the system is from 2014.    He reports no history of MI, CAD, Heart failure or TIA, though they are listed in his history.  Orthopedics has evaluated him for surgery.  He is on acetazolamide for reported chronic LE edema.    Assessment & Plan:    Femoral neck fracture - Jeff Hunt having a mechanical fall at home 8/19, undergoing right total hemiarthroplasty, procedure performed by Dr. August Saucerean on 08/24/2015. - Postoperative course complicated by A. fib with RVR -Physical therapy working with him -Plan to transfer him to orthopedic floor today  Encephalopathy - Likely multifactorial - age, medications, new environment - Improved with redirection and adopted daughter speaking with him - Monitor closely -Physical therapy consulted  Atrial fibrillation - Further work up for new Afib (unclear onset, within last 3 years) ongoing TSH (1.621) and TTE (done today, report pending).  - Telemetry - Currently on diltiazem drip at low dose and metoprolol XR 25 mg - On diltiazem drip post op, continue overnight - Tomorrow, would d/c diltiazem and increase metoprolol to 50mg  XR if needed to get HR < 110.  - CHADS-VASC score of 5 which would favor anticoagulation, I discussed the role of anticoagulation in preventing strokes. His daughter reported that he had a large bloody bowel movement about a month ago that spontaneously resolved and he never sought medical attention for this even though his primary care physician recommended he go to the emergency room. Having recent GI bleed risks of full anticoagulation are likely to outweigh benefits.  Will have him follow-up with cardiology in the outpatient setting to reassess.     Recent history of GI bleed - His daughter reporting that he had a large bloody bowel movement about a month ago for which he never sought medical attention -He has not had evidence of GI bleed on this hospitalization -With him follow-up with GI in the outpatient setting. As stated above risks of anticoagulation may outweigh benefits.    Peripheral edema - Holding acetazolamide in acute setting - Renal function stable.  - Once ambulatory and improving, consider adding back.     HTN (hypertension), benign - BP well controlled from this morning - Continue metoprolol, tamsulosin - Adding Cardizem 120 g by mouth daily  Low Calcium - Recheck CMET in the morning, likely related to low albumin, was normal on admission    DVT prophylaxis: SCDs Code Status: Full Disposition Plan: Plan to transfer him to 5 N, will need skilled nursing facility placement   Consultants:   Orthopedics  Procedures:   TTE  Antimicrobials:   None    Subjective: Pain in the hip, hard of hearing.  No acute distress  Objective: Vitals:   08/26/15 0300 08/26/15 0400 08/26/15 0500 08/26/15 0700  BP: 135/77 129/77 128/69 126/77  Pulse: 93 93 85 91  Resp: (!) 24 (!) 23 19 (!) 22  Temp:   97.6 F (36.4 C) 99.5 F (37.5 C)  TempSrc:    Oral  SpO2: 92% 96% 93% 94%  Weight:      Height:  Intake/Output Summary (Last 24 hours) at 08/26/15 0814 Last data filed at 08/26/15 0100  Gross per 24 hour  Intake             1600 ml  Output              650 ml  Net              950 ml   Filed Weights   08/23/15 1928 08/24/15 2100  Weight: 81.6 kg (180 lb) 81.6 kg (179 lb 14.3 oz)    Examination:  General exam: Appears calm and comfortable, very hard of hearing.  Respiratory system: Clear to auscultation. Respiratory effort normal. Cardiovascular system: S1 & S2 heard, irreg irreg Gastrointestinal system: Abdomen  is nondistended, soft and nontender. +BS Central nervous system: Alert and oriented. No gross neurological deficits.  Very hard of hearing Extremities: He reports sharp pain with passive and active movement of his right lower extremity, surgical incision site no evidence of active bleed Skin: No rashes, lesions or ulcers Psychiatry: Judgement and insight appear normal. Mood & affect appropriate.     Data Reviewed:  CBC:  Recent Labs Lab 08/23/15 2013 08/24/15 0410 08/25/15 0839 08/26/15 0250  WBC 9.3 7.4 7.1 7.3  NEUTROABS 7.8*  --   --   --   HGB 14.1 12.7* 11.9* 11.2*  HCT 44.5 40.3 37.7* 36.6*  MCV 99.1 97.8 97.9 98.4  PLT 162 148* 116* 119*   Basic Metabolic Panel:  Recent Labs Lab 08/23/15 2013 08/24/15 0410 08/25/15 0839 08/26/15 0250  NA 139 138 136 138  K 4.0 4.0 4.1 3.8  CL 112* 113* 112* 113*  CO2 20* 20* 20* 20*  GLUCOSE 116* 156* 145* 126*  BUN 15 17 16 15   CREATININE 0.98 0.92 0.97 0.88  CALCIUM 9.1 8.8* 8.1* 8.0*   GFR: Estimated Creatinine Clearance: 61.8 mL/min (by C-G formula based on SCr of 0.88 mg/dL). Liver Function Tests:  Recent Labs Lab 08/23/15 2013 08/26/15 0250  AST 27 21  ALT 22 13*  ALKPHOS 76 57  BILITOT 0.4 1.5*  PROT 6.9 5.5*  ALBUMIN 4.0 2.7*   No results for input(s): LIPASE, AMYLASE in the last 168 hours. No results for input(s): AMMONIA in the last 168 hours. Coagulation Profile:  Recent Labs Lab 08/23/15 2243  INR 1.07   Cardiac Enzymes:  Recent Labs Lab 08/23/15 2243 08/24/15 0410 08/24/15 1019  TROPONINI <0.03 <0.03 <0.03   BNP (last 3 results) No results for input(s): PROBNP in the last 8760 hours. HbA1C:  Recent Labs  08/24/15 0410  HGBA1C 5.5   CBG: No results for input(s): GLUCAP in the last 168 hours. Lipid Profile: No results for input(s): CHOL, HDL, LDLCALC, TRIG, CHOLHDL, LDLDIRECT in the last 72 hours. Thyroid Function Tests:  Recent Labs  08/23/15 2243  TSH 1.621  FREET4 0.79     Anemia Panel: No results for input(s): VITAMINB12, FOLATE, FERRITIN, TIBC, IRON, RETICCTPCT in the last 72 hours. Sepsis Labs: No results for input(s): PROCALCITON, LATICACIDVEN in the last 168 hours.  Recent Results (from the past 240 hour(s))  Surgical pcr screen     Status: Abnormal   Collection Time: 08/24/15  5:33 AM  Result Value Ref Range Status   MRSA, PCR NEGATIVE NEGATIVE Final   Staphylococcus aureus POSITIVE (A) NEGATIVE Final    Comment:        The Xpert SA Assay (FDA approved for NASAL specimens in patients over 80 years of age), is one  component of a comprehensive surveillance program.  Test performance has been validated by [facility] for patients greater than or equal to 61 year old. It is not intended to diagnose infection nor to guide or monitor treatment.          Radiology Studies: Dg C-arm 61-120 Min  Result Date: 08/24/2015 CLINICAL DATA:  Right hip arthroplasty EXAM: OPERATIVE RIGHT HIP WITH PELVIS; DG C-ARM 61-120 MIN COMPARISON:  None. FLUOROSCOPY TIME:  Radiation Exposure Index (as provided by the fluoroscopic device): Not available If the device does not provide the exposure index: Fluoroscopy Time:  18 seconds Number of Acquired Images:  2 FINDINGS: Right hip arthroplasty is noted in satisfactory position. No acute soft tissue or bony abnormality is noted. IMPRESSION: Status post right hip replacement Electronically Signed   By: Alcide Clever M.D.   On: 08/24/2015 16:55   Dg Hip Port Unilat With Pelvis 1v Right  Result Date: 08/24/2015 CLINICAL DATA:  hip fracture, postop EXAM: DG HIP (WITH OR WITHOUT PELVIS) 1V PORT RIGHT COMPARISON:  Intraoperative film same day FINDINGS: Two views of the right hip submitted included frontal view of the pelvis. There is right hip prosthesis with anatomic alignment. Lateral skin staples are noted. Postsurgical changes with periarticular soft tissue air. IMPRESSION: Right hip prosthesis with anatomic alignment.  Electronically Signed   By: Natasha Mead M.D.   On: 08/24/2015 17:26   Dg Hip Operative Unilat W Or W/o Pelvis Right  Result Date: 08/24/2015 CLINICAL DATA:  Right hip arthroplasty EXAM: OPERATIVE RIGHT HIP WITH PELVIS; DG C-ARM 61-120 MIN COMPARISON:  None. FLUOROSCOPY TIME:  Radiation Exposure Index (as provided by the fluoroscopic device): Not available If the device does not provide the exposure index: Fluoroscopy Time:  18 seconds Number of Acquired Images:  2 FINDINGS: Right hip arthroplasty is noted in satisfactory position. No acute soft tissue or bony abnormality is noted. IMPRESSION: Status post right hip replacement Electronically Signed   By: Alcide Clever M.D.   On: 08/24/2015 16:55   Scheduled Meds: . atorvastatin  40 mg Oral q1800  . citalopram  20 mg Oral Daily  . diltiazem  120 mg Oral Daily  . feeding supplement (ENSURE ENLIVE)  237 mL Oral Q1500  . ferrous sulfate  325 mg Oral BID  . fluticasone  1 spray Each Nare Daily  . metoprolol succinate  25 mg Oral Daily  . mirabegron ER  25 mg Oral Daily  . pantoprazole  40 mg Oral Daily  . polycarbophil  625 mg Oral Daily  . rivaroxaban  10 mg Oral Q breakfast  . tamsulosin  0.4 mg Oral Daily  . tranexamic acid (CYKLOKAPRON) topical -INTRAOP  2,000 mg Topical Once   Continuous Infusions:     LOS: 3 days    Time spent: 30 minutes    Jeralyn Bennett, MD [facility] Pager [phone]  If 7PM-7AM, please contact night-coverage [email or web address] Password [email or web address] 08/26/2015, 8:14 AM

## 2015-08-26 NOTE — Progress Notes (Signed)
Physical Therapy Treatment Patient Details Name: Jeff Hunt MRN: [id number] DOB: 08/18/1925 Today's Date: 08/26/2015    History of Present Illness Patient is an 82 yo male admitted 08/23/15 after a fall with Rt displaced femoral neck fracture.  Patient s/p Rt hemiarthroplasty on 08/24/15.  New diagnosis of Afib with RVR.    PMH:  CAD, TIA, CKD, anemia, dizziness, chronic LE edema, HOH, memory loss    PT Comments    Patient seen for mobility progression. Patient able to tolerate transfer with RW this session. Attempted to initiate steps but at this time, limited to pivotal movements only due to poor ability to tolerate weight shift on RLE. OF NOTE: patient did report dizziness during upright positioning, improved once sitting. Will continue to see and progress as tolerated. Continue to recommend SNF.  Follow Up Recommendations  SNF;Supervision/Assistance - 24 hour     Equipment Recommendations  Rolling walker with 5" wheels;3in1 (PT)    Recommendations for Other Services       Precautions / Restrictions Precautions Precautions: Fall Precaution Comments: Direct anterior approach - No hip precautions Restrictions Weight Bearing Restrictions: No RLE Weight Bearing: Weight bearing as tolerated    Mobility  Bed Mobility Overal bed mobility: Needs Assistance Bed Mobility: Supine to Sit     Supine to sit: Max assist;HOB elevated     General bed mobility comments: Patient required moderate assist to approach EOB going to the right side of the bed. assist to move RLE to EOB, patient able to move LLE on his own, then used LUE to pull to sit with maximal assist for initial power up. Increased time and effort required.  Transfers Overall transfer level: Needs assistance Equipment used: Rolling walker (2 wheeled) Transfers: Sit to/from [employer] CorporationStand;Stand Pivot Transfers Sit to Stand: Mod assist;+2 physical assistance;From elevated surface Stand pivot transfers: Max assist;+2 physical  assistance       General transfer comment: Multimodal cues for hand placement and positioning, elevated surface. Patient required +2 moderate assist to power up to standing positioning with gait belt. Once in standing, cued for upright posture and sequencing for pivotal steps. Patient with difficulty taking on weight (RLE) so cued patient for turning and brought chair up to patient backside with tactile cues for hand placement and assist (min) to control descent to chair.  Ambulation/Gait                 Stairs            Wheelchair Mobility    Modified Rankin (Stroke Patients Only)       Balance Overall balance assessment: Needs assistance Sitting-balance support: Bilateral upper extremity supported Sitting balance-Leahy Scale: Fair     Standing balance support: Bilateral upper extremity supported Standing balance-Leahy Scale: Poor Standing balance comment: heavy reliance on UE support. Patient report dizziness in static standing.                    Cognition Arousal/Alertness: Awake/alert Behavior During Therapy: WFL for tasks assessed/performed Overall Cognitive Status: Difficult to assess                      Exercises      General Comments General comments (skin integrity, edema, etc.): RLE edema noted      Pertinent Vitals/Pain Pain Assessment: 0-10 Pain Score: 3  Pain Location: right LE/hip Pain Descriptors / Indicators: Grimacing;Guarding;Sharp Pain Intervention(s): Limited activity within patient's tolerance;Monitored during session;Repositioned    Home Living  Prior Function            PT Goals (current goals can now be found in the care plan section) Acute Rehab PT Goals Patient Stated Goal: decrease pain PT Goal Formulation: With patient/family Time For Goal Achievement: 09/08/15 Potential to Achieve Goals: Good Progress towards PT goals: Progressing toward goals    Frequency  Min  5X/week    PT Plan Current plan remains appropriate    Co-evaluation             End of Session Equipment Utilized During Treatment: Gait belt;Oxygen Activity Tolerance: Patient limited by fatigue;Patient limited by pain Patient left: in chair;with call bell/phone within reach;with chair alarm set;with family/visitor present     Time: [id number] PT Time Calculation (min) (ACUTE ONLY): 22 min  Charges:  $Therapeutic Activity: 8-22 mins                    G CodesFabio Hunt:      Jeff Hunt 08/26/2015, 5:13 PM Jeff Hunt, PT DPT  [phone]

## 2015-08-26 NOTE — Evaluation (Signed)
Occupational Therapy Evaluation Patient Details Name: Jeff Hunt MRN: [id number] DOB: 03/13/1925 Today's Date: 08/26/2015    History of Present Illness Patient is an 82 yo male admitted 08/23/15 after a fall with Rt displaced femoral neck fracture.  Patient s/p Rt hemiarthroplasty on 08/24/15.  New diagnosis of Afib with RVR.    PMH:  CAD, TIA, CKD, anemia, dizziness, chronic LE edema, HOH, memory loss   Clinical Impression   Patient admitted s/p fall with R hemiarthoroplasty resulting in functional limitations due to the deficits listed below (see OT problem list). PTA was living at home alone.  Patient will benefit from skilled OT acutely to increase independence and safety with ADLS to allow discharge SNF.     Follow Up Recommendations  SNF;Supervision/Assistance - 24 hour    Equipment Recommendations  3 in 1 bedside comode;Wheelchair (measurements OT);Wheelchair cushion (measurements OT);Hospital bed;Other (comment) (RW)    Recommendations for Other Services       Precautions / Restrictions Precautions Precautions: Fall Precaution Comments: Direct anterior approach - No hip precautions Restrictions Weight Bearing Restrictions: No RLE Weight Bearing: Weight bearing as tolerated      Mobility Bed Mobility Overal bed mobility: +2 for physical assistance;Needs Assistance;+ 2 for safety/equipment Bed Mobility: Supine to Sit;Rolling Rolling: +2 for physical assistance;Max assist   Supine to sit: +2 for physical assistance;Max assist     General bed mobility comments: Pt attempting to use trapeze bar initially and needs (A) to log roll L side . pt unable to utilize trapeze bar fully. Pt with pad use to help rotate hips and total (A) to hold bil LE to transfer off EOB. pt once in sitting able to static sit  Transfers Overall transfer level: Needs assistance   Transfers: Sit to/from Stand Sit to Stand: +2 physical assistance;Mod assist         General transfer  comment: pt able to stand from bed surface total +2 min (A) once initiated but required total +2 mod (A) from stedy due to decr understanding of sequence. pt needed (A) to initiate power up and using BIL UE to pull on stedy. Pt with better return demo with counting and slight rocking momentum    Balance Overall balance assessment: Needs assistance Sitting-balance support: Bilateral upper extremity supported;Feet supported Sitting balance-Leahy Scale: Fair     Standing balance support: Bilateral upper extremity supported;During functional activity Standing balance-Leahy Scale: Zero                              ADL Overall ADL's : Needs assistance/impaired     Grooming: Sitting;Moderate assistance   Upper Body Bathing: Total assistance   Lower Body Bathing: Total assistance           Toilet Transfer: +2 for physical assistance;Moderate assistance (sara stedy for all transfers) Toilet Transfer Details (indicate cue type and reason): Pt requires counting with therapist to help patient understand sequence for transfer. Pt able to power up and facial grimace due to R LE pain Toileting- Clothing Manipulation and Hygiene: Total assistance         General ADL Comments: Pt agreeable to OOB. pt requires extended time and reading therapist lips. Pt reports reading lips is better than therapist speaking toward his ears. Pt's daughter present and repeating information initially directly after threapist and then reports "he understands you good" Pt with visual demo of transfer prior to attempt. Pt requires max cueing even after demo. Pt  needed reinforcement of sequence even with demo     Vision     Perception     Praxis      Pertinent Vitals/Pain Pain Assessment: Faces Faces Pain Scale: Hurts whole lot Pain Location: R LE  Pain Descriptors / Indicators: Other (Comment) (pain only with mobility otherwise 2 in sitting) Pain Intervention(s): Limited activity within patient's  tolerance;Monitored during session;Premedicated before session;Repositioned     Hand Dominance Right   Extremity/Trunk Assessment Upper Extremity Assessment Upper Extremity Assessment: RUE deficits/detail;LUE deficits/detail RUE Deficits / Details: AROM shoulder flexion ~80 degrees reports history of decr AROM LUE Deficits / Details: reports no deficits but AROM demonstrates shoulder AROM 90 degrees with accessory msucle recruitment   Lower Extremity Assessment Lower Extremity Assessment: Defer to PT evaluation RLE Deficits / Details: pain and decr strength   Cervical / Trunk Assessment Cervical / Trunk Assessment: Kyphotic   Communication Communication Communication: HOH   Cognition Arousal/Alertness: Awake/alert Behavior During Therapy: Flat affect Overall Cognitive Status: Impaired/Different from baseline Area of Impairment: Safety/judgement;Following commands;Awareness       Following Commands: Follows one step commands inconsistently;Follows one step commands with increased time Safety/Judgement: Decreased awareness of deficits;Decreased awareness of safety Awareness: Emergent   General Comments: Pt needs repetition of instructions and incr time to react. pt repeats information back to therapist to make sure directions are clearly understood due to hoh   General Comments       Exercises       Shoulder Instructions      Home Living Family/patient expects to be discharged to:: Skilled nursing facility                                 Additional Comments: Patient lives alone.  Adopted daughter is with patient "half of the time" per daughter.      Prior Functioning/Environment Level of Independence: Independent             OT Diagnosis: Generalized weakness;Cognitive deficits;Acute pain   OT Problem List: Decreased strength;Decreased range of motion;Decreased activity tolerance;Impaired balance (sitting and/or standing);Decreased  cognition;Decreased coordination;Decreased safety awareness;Decreased knowledge of use of DME or AE;Decreased knowledge of precautions;Impaired sensation;Cardiopulmonary status limiting activity;Obesity;Pain;Increased edema   OT Treatment/Interventions: Self-care/ADL training;Therapeutic exercise;Neuromuscular education;DME and/or AE instruction;Therapeutic activities;Cognitive remediation/compensation;Patient/family education;Balance training    OT Goals(Current goals can be found in the care plan section) Acute Rehab OT Goals Patient Stated Goal: Decrease pain OT Goal Formulation: With patient/family Time For Goal Achievement: 09/09/15 Potential to Achieve Goals: Good  OT Frequency: Min 2X/week   Barriers to D/C: Decreased caregiver support          Co-evaluation              End of Session Equipment Utilized During Treatment: Gait belt;Oxygen Nurse Communication: Mobility status;Precautions;Need for lift equipment  Activity Tolerance: Patient tolerated treatment well Patient left: in chair;with call bell/phone within reach;with SCD's reapplied;with family/visitor present   Time: [id number] OT Time Calculation (min): 27 min Charges:  OT General Charges $OT Visit: 1 Procedure OT Evaluation $OT Eval Moderate Complexity: 1 Procedure OT Treatments $Self Care/Home Management : 8-22 mins G-Codes:    Boone MasterJones, Edwardine Deschepper B 08/26/2015, 9:09 AM   Mateo FlowJones, Brynn   OTR/L Pager: [phone] Office: [phone] .

## 2015-08-26 NOTE — Progress Notes (Signed)
Subjective: Patient stable sitting in bed has been out of bed to chair today   Objective: Vital signs in last 24 hours: Temp:  [97.6 F (36.4 C)-99.5 F (37.5 C)] 99.5 F (37.5 C) (08/21 0700) Pulse Rate:  [69-101] 91 (08/21 0700) Resp:  [17-25] 22 (08/21 0700) BP: (119-136)/(69-78) 126/77 (08/21 0700) SpO2:  [92 %-96 %] 94 % (08/21 0700)  Intake/Output from previous day: 08/20 0701 - 08/21 0700 In: 1842.1 [P.O.:240; I.V.:1602.1] Out: 650 [Urine:650] Intake/Output this shift: No intake/output data recorded.  Exam:  Intact pulses distally  Labs:  Recent Labs  08/23/15 2013 08/24/15 0410 08/25/15 0839 08/26/15 0250  HGB 14.1 12.7* 11.9* 11.2*    Recent Labs  08/25/15 0839 08/26/15 0250  WBC 7.1 7.3  RBC 3.85* 3.72*  HCT 37.7* 36.6*  PLT 116* 119*    Recent Labs  08/25/15 0839 08/26/15 0250  NA 136 138  K 4.1 3.8  CL 112* 113*  CO2 20* 20*  BUN 16 15  CREATININE 0.97 0.88  GLUCOSE 145* 126*  CALCIUM 8.1* 8.0*    Recent Labs  08/23/15 2243  INR 1.07    Assessment/Plan: Plan at this time is to transfer to [facility] [address].  Anticipate discharge to skilled nursing facility on Wednesday.  The issue of DVT prophylaxis is complex in this patient.  We'll defer to medical service.  Patient does have atrial fibrillation but also has history of GI bleed.  Currently taking from review of medical notices that the risk of anticoagulation outweighs the benefits.  The patient is able to ambulate weight-bear as tolerated at this time.  We will continue mechanical DVT prophylaxis at this time.   DEAN,GREGORY SCOTT 08/26/2015, 10:01 AM

## 2015-08-27 LAB — CBC
HEMATOCRIT: 34.1 % — AB (ref 39.0–52.0)
Hemoglobin: 10.5 g/dL — ABNORMAL LOW (ref 13.0–17.0)
MCH: 30.3 pg (ref 26.0–34.0)
MCHC: 30.8 g/dL (ref 30.0–36.0)
MCV: 98.3 fL (ref 78.0–100.0)
PLATELETS: 124 10*3/uL — AB (ref 150–400)
RBC: 3.47 MIL/uL — AB (ref 4.22–5.81)
RDW: 13.4 % (ref 11.5–15.5)
WBC: 6.6 10*3/uL (ref 4.0–10.5)

## 2015-08-27 LAB — BASIC METABOLIC PANEL
Anion gap: 6 (ref 5–15)
BUN: 20 mg/dL (ref 6–20)
CHLORIDE: 108 mmol/L (ref 101–111)
CO2: 25 mmol/L (ref 22–32)
CREATININE: 0.88 mg/dL (ref 0.61–1.24)
Calcium: 8.2 mg/dL — ABNORMAL LOW (ref 8.9–10.3)
GFR calc Af Amer: >60 mL/min (ref >60)
GFR calc non Af Amer: >60 mL/min (ref >60)
Glucose, Bld: 110 mg/dL — ABNORMAL HIGH (ref 65–99)
Potassium: 3.4 mmol/L — ABNORMAL LOW (ref 3.5–5.1)
SODIUM: 139 mmol/L (ref 135–145)

## 2015-08-27 LAB — GLUCOSE, CAPILLARY: Glucose-Capillary: 151 mg/dL — ABNORMAL HIGH (ref 65–99)

## 2015-08-27 MED ORDER — POTASSIUM CHLORIDE CRYS ER 20 MEQ PO TBCR
40.0000 meq | EXTENDED_RELEASE_TABLET | Freq: Once | ORAL | Status: AC
  Administered 2015-08-27: 40 meq via ORAL
  Filled 2015-08-27: qty 2

## 2015-08-27 NOTE — Care Management Important Message (Signed)
Important Message  Patient Details  Name: Jeff Hunt MRN: [id number] Date of Birth: 06/17/1925   Medicare Important Message Given:  Yes    Jeff Hunt 08/27/2015, 10:26 AM

## 2015-08-27 NOTE — Progress Notes (Signed)
Pt was unable to void since this morning. He attempted to use urinal at EOB, but was unsucessful. Bladder scan showed 474 ccs. Dr. Vanessa BarbaraZamora made aware and instructed to I&O cath. I&O cath returned 475 ccs amber urine. Will pass on to oncoming shift.  Jeff Hunt, Jeff Hunt

## 2015-08-27 NOTE — NC FL2 (Signed)
[place] MEDICAID FL2 LEVEL OF CARE SCREENING TOOL     IDENTIFICATION  Patient Name: Jeff Hunt Birthdate: 08/25/1925 Sex: male Admission Date (Current Location): 08/23/2015  [facility] [place]   Facility and Address:  [facility], [address], [place], [place] [address]      Provider Number: [id number]  Attending Physician Name and Address:  Jeralyn BennettEzequiel Zamora, MD  Relative Name and Phone Number:       Current Level of Care: Hospital Recommended Level of Care: Skilled Nursing Facility Prior Approval Number:    Date Approved/Denied:   PASRR Number:  [id number] [id number])  Discharge Plan: SNF    Current Diagnoses: Patient Active Problem List   Diagnosis Date Noted  . Hip fracture (HCC) 08/24/2015  . Fall   . Femoral neck fracture (HCC) 08/23/2015  . Atrial fibrillation (HCC) 08/23/2015  . Rectal bleeding 11/06/2013  . GERD (gastroesophageal reflux disease) 03/15/2012  . Atypical chest pain 02/24/2012  . Loss of weight 01/01/2012  . Dizziness 08/20/2011  . Rotator cuff impingement syndrome 07/22/2011  . HTN (hypertension), benign 03/16/2011  . Rash 03/06/2011  . Irregular cardiac rhythm 02/18/2011  . Peripheral edema 02/18/2011  . Anemia, iron deficiency 02/03/2011  . SKIN LESIONS, MULTIPLE 12/18/2009  . LEG PAIN 12/03/2009  . VENOUS INSUFFICIENCY, LEGS 06/14/2009  . HEMATOCHEZIA 12/06/2008  . GROSS HEMATURIA 12/06/2008  . ONYCHOMYCOSIS, TOENAILS 08/09/2007  . DERMATITIS, STASIS 08/09/2007  . MEMORY LOSS 08/01/2007  . HYPERLIPIDEMIA 12/06/2006  . DEPRESSION 12/06/2006  . Coronary atherosclerosis 12/06/2006  . CEREBROVASCULAR DISEASE 12/06/2006  . BENIGN PROSTATIC HYPERTROPHY 12/06/2006  . ARTHRITIS 12/06/2006    Orientation RESPIRATION BLADDER Height & Weight     Self, Time, Situation, Place  Normal Continent Weight: 179 lb 14.3 oz (81.6 kg) Height:  5\' 11"  (180.3 cm)  BEHAVIORAL SYMPTOMS/MOOD NEUROLOGICAL  BOWEL NUTRITION STATUS      Continent    AMBULATORY STATUS COMMUNICATION OF NEEDS Skin   Extensive Assist Verbally Normal                       Personal Care Assistance Level of Assistance  Bathing, Dressing Bathing Assistance: Maximum assistance   Dressing Assistance: Maximum assistance     Functional Limitations Info             SPECIAL CARE FACTORS FREQUENCY                       Contractures      Additional Factors Info                  Current Medications (08/27/2015):  This is the current hospital active medication list Current Facility-Administered Medications  Medication Dose Route Frequency Provider Last Rate Last Dose  . acetaminophen (TYLENOL) tablet 650 mg  650 mg Oral Q6H PRN Cammy CopaScott Gregory Dean, MD       Or  . acetaminophen (TYLENOL) suppository 650 mg  650 mg Rectal Q6H PRN Cammy CopaScott Gregory Dean, MD      . antiseptic oral rinse (CPC / CETYLPYRIDINIUM CHLORIDE 0.05%) solution 7 mL  7 mL Mouth Rinse BID Jeralyn BennettEzequiel Zamora, MD   7 mL at 08/27/15 1114  . atorvastatin (LIPITOR) tablet 40 mg  40 mg Oral q1800 Michael LitterNikki Carter, MD   40 mg at 08/26/15 1609  . citalopram (CELEXA) tablet 20 mg  20 mg Oral Daily Cammy CopaScott Gregory Dean, MD   20 mg at 08/27/15  1114  . diltiazem (CARDIZEM CD) 24 hr capsule 120 mg  120 mg Oral Daily Jeralyn BennettEzequiel Zamora, MD   120 mg at 08/27/15 1112  . feeding supplement (ENSURE ENLIVE) (ENSURE ENLIVE) liquid 237 mL  237 mL Oral Q1500 Inez CatalinaEmily B Mullen, MD   237 mL at 08/27/15 1451  . ferrous sulfate tablet 325 mg  325 mg Oral BID Michael LitterNikki Carter, MD   325 mg at 08/27/15 1110  . fluticasone (FLONASE) 50 MCG/ACT nasal spray 1 spray  1 spray Each Nare Daily Michael LitterNikki Carter, MD   1 spray at 08/26/15 0930  . HYDROcodone-acetaminophen (NORCO/VICODIN) 5-325 MG per tablet 1-2 tablet  1-2 tablet Oral Q6H PRN Michael LitterNikki Carter, MD   1 tablet at 08/27/15 1450  . menthol-cetylpyridinium (CEPACOL) lozenge 3 mg  1 lozenge Oral PRN Cammy CopaScott Gregory Dean, MD       Or  .  phenol [facility](CHLORASEPTIC) mouth spray 1 spray  1 spray Mouth/Throat PRN Cammy CopaScott Gregory Dean, MD      . metoprolol succinate (TOPROL-XL) 24 hr tablet 25 mg  25 mg Oral Daily Michael LitterNikki Carter, MD   25 mg at 08/27/15 1000  . mirabegron ER (MYRBETRIQ) tablet 25 mg  25 mg Oral Daily Cammy CopaScott Gregory Dean, MD   25 mg at 08/27/15 1114  . morphine 2 MG/ML injection 0.5 mg  0.5 mg Intravenous Q2H PRN Michael LitterNikki Carter, MD   0.5 mg at 08/25/15 1355  . ondansetron (ZOFRAN) tablet 4 mg  4 mg Oral Q6H PRN Cammy CopaScott Gregory Dean, MD       Or  . ondansetron [facility](ZOFRAN) injection 4 mg  4 mg Intravenous Q6H PRN Cammy CopaScott Gregory Dean, MD      . pantoprazole (PROTONIX) EC tablet 40 mg  40 mg Oral Daily Michael LitterNikki Carter, MD   40 mg at 08/27/15 1114  . polycarbophil (FIBERCON) tablet 625 mg  625 mg Oral Daily Cammy CopaScott Gregory Dean, MD   625 mg at 08/27/15 1114  . psyllium (HYDROCIL/METAMUCIL) packet 1 packet  1 packet Oral Daily PRN Michael LitterNikki Carter, MD      . rivaroxaban Carlena Hurl(XARELTO) tablet 10 mg  10 mg Oral Q breakfast Cammy CopaScott Gregory Dean, MD   10 mg at 08/27/15 0902  . tamsulosin (FLOMAX) capsule 0.4 mg  0.4 mg Oral Daily Michael LitterNikki Carter, MD   0.4 mg at 08/27/15 1114  . tranexamic acid (CYKLOKAPRON) 2,000 mg in sodium chloride 0.9 % 50 mL Topical Application  2,000 mg Topical Once Inez CatalinaEmily B Mullen, MD         Discharge Medications: Please see discharge summary for a list of discharge medications.  Relevant Imaging Results:  Relevant Lab Results:   Additional Information  (SS: [id number])  Alene MiresWhitaker, Stacie Templin R

## 2015-08-27 NOTE — Progress Notes (Signed)
   08/27/15 1200  Clinical Encounter Type  Visited With Patient and family together  Visit Type Spiritual support  Referral From Family  Spiritual Encounters  Spiritual Needs Literature  Stress Factors  Patient Stress Factors Other (Comment) (Age, medicine, pain causing confusion?)  Patient's friend requested power of attorney forms and explanation. Chaplain provided them. At time, patient seemed agitated and confused by our discussion. Chaplain spoke to nurse after, who said that patient seems to be getting less confused but was unable to say much more about state of mind.  Valisha Heslin, Chaplain

## 2015-08-27 NOTE — Progress Notes (Signed)
PROGRESS NOTE    Jeff Hunt  ZOX:[id number] DOB: 04/15/1925 DOA: 08/23/2015 PCP: Ginette OttoSTONEKING,HAL THOMAS, MD    Brief Narrative:  82yo man presented after a mechanical fall at home.  He was diagnosed with a right subcapital femoral neck fracture.  On admission he was found to be in Afib with RVR, this is a new diagnosis, but unclear duration given last EKG in the system is from 2014.    He reports no history of MI, CAD, Heart failure or TIA, though they are listed in his history.  Orthopedics has evaluated him for surgery.  He is on acetazolamide for reported chronic LE edema.    Assessment & Plan:    Femoral neck fracture - Mr Jeff Hunt having a mechanical fall at home 8/19, undergoing right total hemiarthroplasty, procedure performed by Dr. August Saucerean on 08/24/2015. - Postoperative course complicated by A. fib with RVR -Physical therapy working with him -Plan to discharge to skilled nursing facility in the next 24 hours  Encephalopathy - Likely multifactorial - age, medications, new environment - Improved with redirection and adopted daughter speaking with him - Monitor closely -Physical therapy consulted  Atrial fibrillation - Further work up for new Afib (unclear onset, within last 3 years) ongoing TSH (1.621)  - He had been treated with diltiazem drip which was transitioned to oral diltiazem 120 mg by mouth daily -Transthoracic echocardiogram performed 08/25/2015 showed preserved ejection fraction 55% - CHADS-VASC score of 5 which would favor anticoagulation, I discussed the role of anticoagulation in preventing strokes. His daughter reported that he had a large bloody bowel movement about a month ago that spontaneously resolved and he never sought medical attention for this even though his primary care physician recommended he go to the emergency room. Having recent GI bleed risks of full anticoagulation are likely to outweigh benefits. Will have him follow-up with cardiology in the  outpatient setting to reassess.  -On 08/27/2015 remains rate controlled, continue prednisone 20 mg by mouth daily metoprolol 24 mg by mouth daily    Recent history of GI bleed - His daughter reporting that he had a large bloody bowel movement about a month ago for which he never sought medical attention -He has not had evidence of GI bleed on this hospitalization -With him follow-up with GI in the outpatient setting.    Peripheral edema - Holding acetazolamide in acute setting - Renal function stable.  - Once ambulatory and improving, consider adding back.     HTN (hypertension), benign - BP well controlled from this morning - Continue metoprolol, tamsulosin - Added Cardizem 120 g by mouth daily  Low Calcium - Recheck CMET in the morning, likely related to low albumin, was normal on admission    DVT prophylaxis: SCDs Code Status: Full Disposition Plan: Plan for discharge to skilled nursing facility in the next 24 hours   Consultants:   Orthopedics  Procedures:   TTE  Antimicrobials:   None    Subjective: Pain in the hip, hard of hearing.  No acute distress  Objective: Vitals:   08/26/15 2003 08/27/15 0500 08/27/15 1100 08/27/15 1420  BP: 135/68 115/65  93/60  Pulse: 94 92  99  Resp: 18 16  18   Temp: 97.7 F (36.5 C) 97.6 F (36.4 C)    TempSrc: Oral Oral    SpO2: 95% 97% 95% 92%  Weight:      Height:        Intake/Output Summary (Last 24 hours) at 08/27/15 1523 Last data filed  at 08/27/15 1200  Gross per 24 hour  Intake              120 ml  Output              400 ml  Net             -280 ml   Filed Weights   08/23/15 1928 08/24/15 2100  Weight: 81.6 kg (180 lb) 81.6 kg (179 lb 14.3 oz)    Examination:  General exam: Appears calm and comfortable, very hard of hearing.  Respiratory system: Clear to auscultation. Respiratory effort normal. Cardiovascular system: S1 & S2 heard, irreg irreg Gastrointestinal system: Abdomen is nondistended, soft  and nontender. +BS Central nervous system: Alert and oriented. No gross neurological deficits.  Very hard of hearing Extremities: He reports sharp pain with passive and active movement of his right lower extremity, surgical incision site no evidence of active bleed Skin: No rashes, lesions or ulcers Psychiatry: Judgement and insight appear normal. Mood & affect appropriate.     Data Reviewed:  CBC:  Recent Labs Lab 08/23/15 2013 08/24/15 0410 08/25/15 0839 08/26/15 0250 08/27/15 0517  WBC 9.3 7.4 7.1 7.3 6.6  NEUTROABS 7.8*  --   --   --   --   HGB 14.1 12.7* 11.9* 11.2* 10.5*  HCT 44.5 40.3 37.7* 36.6* 34.1*  MCV 99.1 97.8 97.9 98.4 98.3  PLT 162 148* 116* 119* 124*   Basic Metabolic Panel:  Recent Labs Lab 08/23/15 2013 08/24/15 0410 08/25/15 0839 08/26/15 0250 08/27/15 0517  NA 139 138 136 138 139  K 4.0 4.0 4.1 3.8 3.4*  CL 112* 113* 112* 113* 108  CO2 20* 20* 20* 20* 25  GLUCOSE 116* 156* 145* 126* 110*  BUN 15 17 16 15 20   CREATININE 0.98 0.92 0.97 0.88 0.88  CALCIUM 9.1 8.8* 8.1* 8.0* 8.2*   GFR: Estimated Creatinine Clearance: 61.8 mL/min (by C-G formula based on SCr of 0.88 mg/dL). Liver Function Tests:  Recent Labs Lab 08/23/15 2013 08/26/15 0250  AST 27 21  ALT 22 13*  ALKPHOS 76 57  BILITOT 0.4 1.5*  PROT 6.9 5.5*  ALBUMIN 4.0 2.7*   No results for input(s): LIPASE, AMYLASE in the last 168 hours. No results for input(s): AMMONIA in the last 168 hours. Coagulation Profile:  Recent Labs Lab 08/23/15 2243  INR 1.07   Cardiac Enzymes:  Recent Labs Lab 08/23/15 2243 08/24/15 0410 08/24/15 1019  TROPONINI <0.03 <0.03 <0.03   BNP (last 3 results) No results for input(s): PROBNP in the last 8760 hours. HbA1C: No results for input(s): HGBA1C in the last 72 hours. CBG:  Recent Labs Lab 08/27/15 1130  GLUCAP 151*   Lipid Profile: No results for input(s): CHOL, HDL, LDLCALC, TRIG, CHOLHDL, LDLDIRECT in the last 72  hours. Thyroid Function Tests: No results for input(s): TSH, T4TOTAL, FREET4, T3FREE, THYROIDAB in the last 72 hours. Anemia Panel: No results for input(s): VITAMINB12, FOLATE, FERRITIN, TIBC, IRON, RETICCTPCT in the last 72 hours. Sepsis Labs: No results for input(s): PROCALCITON, LATICACIDVEN in the last 168 hours.  Recent Results (from the past 240 hour(s))  Surgical pcr screen     Status: Abnormal   Collection Time: 08/24/15  5:33 AM  Result Value Ref Range Status   MRSA, PCR NEGATIVE NEGATIVE Final   Staphylococcus aureus POSITIVE (A) NEGATIVE Final    Comment:        The Xpert SA Assay (FDA approved for NASAL specimens in  patients over 64 years of age), is one component of a comprehensive surveillance program.  Test performance has been validated by [facility] for patients greater than or equal to 59 year old. It is not intended to diagnose infection nor to guide or monitor treatment.          Radiology Studies: No results found. Scheduled Meds: . antiseptic oral rinse  7 mL Mouth Rinse BID  . atorvastatin  40 mg Oral q1800  . citalopram  20 mg Oral Daily  . diltiazem  120 mg Oral Daily  . feeding supplement (ENSURE ENLIVE)  237 mL Oral Q1500  . ferrous sulfate  325 mg Oral BID  . fluticasone  1 spray Each Nare Daily  . metoprolol succinate  25 mg Oral Daily  . mirabegron ER  25 mg Oral Daily  . pantoprazole  40 mg Oral Daily  . polycarbophil  625 mg Oral Daily  . rivaroxaban  10 mg Oral Q breakfast  . tamsulosin  0.4 mg Oral Daily  . tranexamic acid (CYKLOKAPRON) topical -INTRAOP  2,000 mg Topical Once   Continuous Infusions:     LOS: 4 days    Time spent: 25 minutes    Jeralyn Bennett, MD [facility] Pager [phone]  If 7PM-7AM, please contact night-coverage [email or web address] Password [facility] 08/27/2015, 3:23 PM

## 2015-08-27 NOTE — Progress Notes (Signed)
Physical Therapy Treatment Patient Details Name: Jeff Hunt MRN: [id number] DOB: 07/29/1925 Today's Date: 08/27/2015    History of Present Illness Patient is an 82 yo male admitted 08/23/15 after a fall with Rt displaced femoral neck fracture.  Patient s/p Rt hemiarthroplasty on 08/24/15.  New diagnosis of Afib with RVR.    PMH:  CAD, TIA, CKD, anemia, dizziness, chronic LE edema, HOH, memory loss    PT Comments    Patient progressing slowly towards PT goals. Continues to require assist of 2 to stand from surfaces. Pt highly motivated to participate in therapy. Tolerated short distance gait training but limited due to fatigue and DOE. Hr up to 130 bpm A-fib. Does well when you explain the plan of what you are going to do. Will continue to follow.   Follow Up Recommendations  SNF;Supervision/Assistance - 24 hour     Equipment Recommendations  Rolling walker with 5" wheels;3in1 (PT)    Recommendations for Other Services       Precautions / Restrictions Precautions Precautions: Fall Precaution Comments: Direct anterior approach - No hip precautions Restrictions Weight Bearing Restrictions: Yes RLE Weight Bearing: Weight bearing as tolerated    Mobility  Bed Mobility Overal bed mobility: Needs Assistance Bed Mobility: Supine to Sit     Supine to sit: Mod assist;HOB elevated     General bed mobility comments: Able to bring BLEs to EOB using trapeze bar. Cues to reach for rail to assist with trunk. Increased time and effort.   Transfers Overall transfer level: Needs assistance Equipment used: Rolling walker (2 wheeled) Transfers: Sit to/from Stand Sit to Stand: Mod assist;+2 physical assistance;From elevated surface Stand pivot transfers: Mod assist;+2 physical assistance       General transfer comment: Multimodal cues for hand placement/technique. Does really well when plan is described before. Assist of 2 to power up from standing. ABle to take a few steps to chair  with posterior lean noted. Assist to control descent into chair.  Ambulation/Gait Ambulation/Gait assistance: Min assist Ambulation Distance (Feet): 10 Feet Assistive device: Rolling walker (2 wheeled) Gait Pattern/deviations: Decreased stance time - right;Decreased step length - right;Decreased step length - left;Step-to pattern;Step-through pattern;Trunk flexed Gait velocity: decreased   General Gait Details: Slow, unsteady gait with difficulty advancing RLE. DOE. HR up to 130 bpm A-fib.   Stairs            Wheelchair Mobility    Modified Rankin (Stroke Patients Only)       Balance Overall balance assessment: Needs assistance Sitting-balance support: Feet supported;No upper extremity supported Sitting balance-Leahy Scale: Fair     Standing balance support: During functional activity Standing balance-Leahy Scale: Poor Standing balance comment: Reliant on BUEs for support.                     Cognition Arousal/Alertness: Awake/alert Behavior During Therapy: WFL for tasks assessed/performed Overall Cognitive Status: Difficult to assess         Following Commands: Follows one step commands with increased time       General Comments: Requires repetition and increased time to perform tasks [facility]. Repeats information back and likes to know the "why" behind all what he is doing.    Exercises Total Joint Exercises Ankle Circles/Pumps: Both;10 reps;Supine Straight Leg Raises: Right;5 reps;Seated    General Comments General comments (skin integrity, edema, etc.): Daughter present in room during session.      Pertinent Vitals/Pain Pain Assessment: Faces Faces Pain Scale: Hurts  little more Pain Location: right hip Pain Descriptors / Indicators: Grimacing;Guarding Pain Intervention(s): Monitored during session;Repositioned    Home Living                      Prior Function            PT Goals (current goals can now be found in the  care plan section) Progress towards PT goals: Progressing toward goals    Frequency  Min 5X/week    PT Plan Current plan remains appropriate    Co-evaluation             End of Session Equipment Utilized During Treatment: Gait belt Activity Tolerance: Patient limited by fatigue;Patient tolerated treatment well Patient left: in chair;with call bell/phone within reach;with chair alarm set     Time: 1329-1401 PT Time Calculation (min) (ACUTE ONLY): 32 min  Charges:  $Gait Training: 8-22 mins $Therapeutic Activity: 8-22 mins                    G Codes:      Birtha Hatler A Jasyn Mey 08/27/2015, 3:41 PM Mylo RedShauna Kayren Holck, PT, DPT [phone]

## 2015-08-28 ENCOUNTER — Encounter (HOSPITAL_COMMUNITY): Payer: Self-pay | Admitting: General Practice

## 2015-08-28 DIAGNOSIS — I10 Essential (primary) hypertension: Secondary | ICD-10-CM

## 2015-08-28 DIAGNOSIS — I481 Persistent atrial fibrillation: Secondary | ICD-10-CM

## 2015-08-28 DIAGNOSIS — S72001A Fracture of unspecified part of neck of right femur, initial encounter for closed fracture: Secondary | ICD-10-CM

## 2015-08-28 MED ORDER — DIGOXIN 125 MCG PO TABS
0.1250 mg | ORAL_TABLET | Freq: Once | ORAL | Status: AC
  Administered 2015-08-28: 0.125 mg via ORAL
  Filled 2015-08-28: qty 1

## 2015-08-28 MED ORDER — DIGOXIN 125 MCG PO TABS
0.1250 mg | ORAL_TABLET | Freq: Four times a day (QID) | ORAL | Status: AC
  Administered 2015-08-28 (×2): 0.125 mg via ORAL
  Filled 2015-08-28 (×2): qty 1

## 2015-08-28 MED ORDER — DIGOXIN 125 MCG PO TABS
0.0625 mg | ORAL_TABLET | Freq: Every day | ORAL | Status: DC
  Administered 2015-08-29: 0.0625 mg via ORAL
  Filled 2015-08-28: qty 1

## 2015-08-28 MED ORDER — METOPROLOL TARTRATE 5 MG/5ML IV SOLN
5.0000 mg | Freq: Once | INTRAVENOUS | Status: AC
  Administered 2015-08-28: 5 mg via INTRAVENOUS
  Filled 2015-08-28: qty 5

## 2015-08-28 NOTE — Progress Notes (Signed)
Physical Therapy Treatment Patient Details Name: Jeff Hunt MRN: [id number] DOB: 01/15/1925 Today's Date: 08/28/2015    History of Present Illness Patient is an 82 yo male admitted 08/23/15 after a fall with Rt displaced femoral neck fracture.  Patient s/p Rt hemiarthroplasty on 08/24/15.  New diagnosis of Afib with RVR.    PMH:  CAD, TIA, CKD, anemia, dizziness, chronic LE edema, HOH, memory loss    PT Comments    Pt very hard of hearing, but does well with loud deep voice in L ear or allowing pt to read lips.  Pt participates well with mobility, though continues to require 2 person A for all mobility.  Will continue to follow.    Follow Up Recommendations  SNF     Equipment Recommendations  Rolling walker with 5" wheels;3in1 (PT)    Recommendations for Other Services       Precautions / Restrictions Precautions Precautions: Fall Precaution Comments: Direct anterior approach - No hip precautions Restrictions Weight Bearing Restrictions: Yes RLE Weight Bearing: Weight bearing as tolerated    Mobility  Bed Mobility Overal bed mobility: Needs Assistance;+2 for physical assistance Bed Mobility: Supine to Sit     Supine to sit: Mod assist;+2 for physical assistance;HOB elevated     General bed mobility comments: pt does well initiating mobility and bringing his LEs to EOB, but then needs A for bringing LEs OOB and for bringing trunk up to sitting.    Transfers Overall transfer level: Needs assistance Equipment used: 2 person hand held assist Transfers: Sit to/from UGI CorporationStand;Stand Pivot Transfers Sit to Stand: Mod assist;+2 physical assistance Stand pivot transfers: Total assist;+2 physical assistance       General transfer comment: pt does well with multimodal cueing and allowing pt to count to 3 to initiate soming to standing, but then froze during pivot and was unable to unweight L LE for pivotal steps to recliner.    Ambulation/Gait                  Stairs            Wheelchair Mobility    Modified Rankin (Stroke Patients Only)       Balance Overall balance assessment: Needs assistance;History of Falls Sitting-balance support: Bilateral upper extremity supported;Single extremity supported;Feet supported Sitting balance-Leahy Scale: Poor     Standing balance support: During functional activity Standing balance-Leahy Scale: Poor                      Cognition Arousal/Alertness: Awake/alert Behavior During Therapy: WFL for tasks assessed/performed Overall Cognitive Status: Difficult to assess                 General Comments: pt extremely hard of hearing, so difficult to tell if he can't hear or is truly having difficulty following directions and staying on task.      Exercises      General Comments        Pertinent Vitals/Pain Pain Assessment: Faces Faces Pain Scale: Hurts little more Pain Location: R Hip Pain Descriptors / Indicators: Grimacing;Guarding Pain Intervention(s): Monitored during session;Premedicated before session;Repositioned    Home Living                      Prior Function            PT Goals (current goals can now be found in the care plan section) Acute Rehab PT Goals Patient Stated Goal: decrease pain PT  Goal Formulation: With patient/family Time For Goal Achievement: 09/08/15 Potential to Achieve Goals: Good Progress towards PT goals: Progressing toward goals    Frequency  Min 3X/week    PT Plan Frequency needs to be updated    Co-evaluation PT/OT/SLP Co-Evaluation/Treatment: Yes Reason for Co-Treatment: For patient/therapist safety PT goals addressed during session: Mobility/safety with mobility;Balance       End of Session Equipment Utilized During Treatment: Gait belt Activity Tolerance: Patient limited by fatigue Patient left: in chair;with call bell/phone within reach;with chair alarm set;with family/visitor present     Time:  [id number] PT Time Calculation (min) (ACUTE ONLY): 23 min  Charges:  $Therapeutic Activity: 8-22 mins                    G CodesSunny Hunt:      Jeff Hunt, [place]PT [phone] 08/28/2015, 2:01 PM

## 2015-08-28 NOTE — Progress Notes (Signed)
Pt stable - pain ok  Mobilizing slowly Ready for dc am to snf

## 2015-08-28 NOTE — Progress Notes (Addendum)
PROGRESS NOTE  Jeff Hunt  WUJ:[id number] DOB: 06/04/1925 DOA: 08/23/2015 PCP: Ginette OttoSTONEKING,HAL THOMAS, MD  Brief Narrative:   82yo man presented after a mechanical fall at home.  He was diagnosed with a right subcapital femoral neck fracture.  On admission he was found to be in Afib with RVR, this is a new diagnosis, but unclear duration given last EKG in the system is from 2014.  He reported no history of MI, CAD, Heart failure or TIA, though they are listed in his history.  Orthopedics performed anterior approach right hip hemiarthroplasty on 8/19. Postoperatively, he was rate controlled on diltiazem infusion and transitioned eventually to oral diltiazem. He had recurrent atrial fibrillation versus possible a flutter the morning of 8/23.  Assessment & Plan:   Principal Problem:   Femoral neck fracture (HCC) Active Problems:   Coronary atherosclerosis   Anemia, iron deficiency   Peripheral edema   HTN (hypertension), benign   Atrial fibrillation (HCC)   Fall   Hip fracture (HCC)  Femoral neck fracture due to mechanical fall at home 8/19 status post right hemiarthroplasty by Dr. August Saucerean on 08/24/2015. - Postoperative course complicated by A. fib with RVR -Physical therapy recommending skilled nursing facility  Encephalopathy, likely multifactorial - age, medications, new environment, improving - Daughter at bedside  Atrial fibrillation and appears to be having some episodes of atrial flutter. Rate approximately 150 bpm sustained.  Blood pressure low normal to mildly hypotensive well rate controlled yesterday. - TSH (1.621) -  Trop negative - Transthoracic echocardiogram performed 08/25/2015 showed preserved ejection fraction 55% - Continue oral diltiazem 120 mg by mouth daily - continue metoprolol - Load with digoxin - Check telemetry this afternoon after digoxin load, and heart rate has trended down - CHADS-VASC score of 5  - Xarelto  - Will need outpatient cardiology  follow up    Recent history of GI bleed - His daughter reporting that he had a large bloody bowel movement about a month ago for which he never sought medical attention -He has not had evidence of GI bleed on this hospitalization -With him follow-up with GI in the outpatient setting. - Repeat CBC in 1 week postdischarge to follow-up anemia    Peripheral edema - Holding acetazolamide in acute setting - Renal function stable.  - Once ambulatory and improving, consider adding back.     HTN (hypertension), borderline hypotensive - Continue metoprolol, tamsulosin - Continue  Cardizem 120 g by mouth daily  Low Calcium - Recheck CMET in the morning, likely related to low albumin, was normal on admission   DVT prophylaxis:  Xarelto Code Status:  Full Family Communication:  Patient and daughter Disposition Plan:  To skilled nursing facility possibly as early as tomorrow   Consultants:   Orthopedic surgery, Dr. August Saucerean  Procedures:  Right hip hemiarthroplasty 8/19  Antimicrobials:   none    Subjective: Confused.  Deaf.  Having pain in the right leg.  Feels that his thinking is confused.  Frustrated.    Objective: Vitals:   08/27/15 1420 08/27/15 1959 08/28/15 0500 08/28/15 1300  BP: 93/60 116/68 (!) 142/73 104/60  Pulse: 99 95 94 71  Resp: 18 18 18 18   Temp:  98.8 F (37.1 C) 98.4 F (36.9 C) 98.8 F (37.1 C)  TempSrc:  Oral Oral Oral  SpO2: 92% 91% 93% 94%  Weight:      Height:        Intake/Output Summary (Last 24 hours) at 08/28/15 1515 Last data filed  at 08/28/15 1100  Gross per 24 hour  Intake              480 ml  Output              475 ml  Net                5 ml   Filed Weights   08/23/15 1928 08/24/15 2100  Weight: 81.6 kg (180 lb) 81.6 kg (179 lb 14.3 oz)    Examination:  General exam:  Adult male, looks to daughter for communication assistance.  No acute distress.  HEENT:  NCAT, MMM Respiratory system: Clear to auscultation  bilaterally Cardiovascular system:  IRRR and tachycardic, normal S1/S2. No murmurs, rubs, gallops or clicks.  Warm extremities Gastrointestinal system: Normal active bowel sounds, soft, nondistended, nontender. MSK:  Normal tone and bulk, no lower extremity edema.  Right anterior hip bandage with trace serosanguinous, mild swelling but no palpable hematoma Neuro:  Grossly intact. Sensation on right foot intact, able to wiggle toes on right foot.      Data Reviewed: I have personally reviewed following labs and imaging studies  CBC:  Recent Labs Lab 08/23/15 2013 08/24/15 0410 08/25/15 0839 08/26/15 0250 08/27/15 0517  WBC 9.3 7.4 7.1 7.3 6.6  NEUTROABS 7.8*  --   --   --   --   HGB 14.1 12.7* 11.9* 11.2* 10.5*  HCT 44.5 40.3 37.7* 36.6* 34.1*  MCV 99.1 97.8 97.9 98.4 98.3  PLT 162 148* 116* 119* 124*   Basic Metabolic Panel:  Recent Labs Lab 08/23/15 2013 08/24/15 0410 08/25/15 0839 08/26/15 0250 08/27/15 0517  NA 139 138 136 138 139  K 4.0 4.0 4.1 3.8 3.4*  CL 112* 113* 112* 113* 108  CO2 20* 20* 20* 20* 25  GLUCOSE 116* 156* 145* 126* 110*  BUN 15 17 16 15 20   CREATININE 0.98 0.92 0.97 0.88 0.88  CALCIUM 9.1 8.8* 8.1* 8.0* 8.2*   GFR: Estimated Creatinine Clearance: 61.8 mL/min (by C-G formula based on SCr of 0.88 mg/dL). Liver Function Tests:  Recent Labs Lab 08/23/15 2013 08/26/15 0250  AST 27 21  ALT 22 13*  ALKPHOS 76 57  BILITOT 0.4 1.5*  PROT 6.9 5.5*  ALBUMIN 4.0 2.7*   No results for input(s): LIPASE, AMYLASE in the last 168 hours. No results for input(s): AMMONIA in the last 168 hours. Coagulation Profile:  Recent Labs Lab 08/23/15 2243  INR 1.07   Cardiac Enzymes:  Recent Labs Lab 08/23/15 2243 08/24/15 0410 08/24/15 1019  TROPONINI <0.03 <0.03 <0.03   BNP (last 3 results) No results for input(s): PROBNP in the last 8760 hours. HbA1C: No results for input(s): HGBA1C in the last 72 hours. CBG:  Recent Labs Lab  08/27/15 1130  GLUCAP 151*   Lipid Profile: No results for input(s): CHOL, HDL, LDLCALC, TRIG, CHOLHDL, LDLDIRECT in the last 72 hours. Thyroid Function Tests: No results for input(s): TSH, T4TOTAL, FREET4, T3FREE, THYROIDAB in the last 72 hours. Anemia Panel: No results for input(s): VITAMINB12, FOLATE, FERRITIN, TIBC, IRON, RETICCTPCT in the last 72 hours. Urine analysis:    Component Value Date/Time   COLORURINE YELLOW 08/24/2015 1023   APPEARANCEUR CLOUDY (A) 08/24/2015 1023   LABSPEC 1.020 08/24/2015 1023   PHURINE 7.0 08/24/2015 1023   GLUCOSEU NEGATIVE 08/24/2015 1023   HGBUR NEGATIVE 08/24/2015 1023   BILIRUBINUR NEGATIVE 08/24/2015 1023   KETONESUR NEGATIVE 08/24/2015 1023   PROTEINUR NEGATIVE 08/24/2015 1023   NITRITE  NEGATIVE 08/24/2015 1023   LEUKOCYTESUR SMALL (A) 08/24/2015 1023   Sepsis Labs: @LABRCNTIP (procalcitonin:4,lacticidven:4)  ) Recent Results (from the past 240 hour(s))  Surgical pcr screen     Status: Abnormal   Collection Time: 08/24/15  5:33 AM  Result Value Ref Range Status   MRSA, PCR NEGATIVE NEGATIVE Final   Staphylococcus aureus POSITIVE (A) NEGATIVE Final    Comment:        The Xpert SA Assay (FDA approved for NASAL specimens in patients over 67 years of age), is one component of a comprehensive surveillance program.  Test performance has been validated by [facility] for patients greater than or equal to 38 year old. It is not intended to diagnose infection nor to guide or monitor treatment.       Radiology Studies: No results found.   Scheduled Meds: . antiseptic oral rinse  7 mL Mouth Rinse BID  . atorvastatin  40 mg Oral q1800  . citalopram  20 mg Oral Daily  . [START ON 08/29/2015] digoxin  0.0625 mg Oral Daily  . digoxin  0.125 mg Oral Q6H  . diltiazem  120 mg Oral Daily  . feeding supplement (ENSURE ENLIVE)  237 mL Oral Q1500  . ferrous sulfate  325 mg Oral BID  . fluticasone  1 spray Each Nare Daily  . metoprolol  succinate  25 mg Oral Daily  . mirabegron ER  25 mg Oral Daily  . pantoprazole  40 mg Oral Daily  . polycarbophil  625 mg Oral Daily  . rivaroxaban  10 mg Oral Q breakfast  . tamsulosin  0.4 mg Oral Daily   Continuous Infusions:    LOS: 5 days    Time spent: 30 min    Renae Fickle, MD [facility] Pager [phone]  If 7PM-7AM, please contact night-coverage [email or web address] Password [facility] 08/28/2015, 3:15 PM

## 2015-08-28 NOTE — Progress Notes (Signed)
Occupational Therapy Treatment Patient Details Name: Jeff Hunt MRN: [id number] DOB: 05/01/1925 Today's Date: 08/28/2015    History of present illness Patient is an 82 yo male admitted 08/23/15 after a fall with Rt displaced femoral neck fracture.  Patient s/p Rt hemiarthroplasty on 08/24/15.  New diagnosis of Afib with RVR.    PMH:  CAD, TIA, CKD, anemia, dizziness, chronic LE edema, HOH, memory loss   OT comments  Pt making progress with functional goals. OT will continue to follow acutely  Follow Up Recommendations  SNF;Supervision/Assistance - 24 hour    Equipment Recommendations  3 in 1 bedside comode;Wheelchair (measurements OT);Wheelchair cushion (measurements OT);Hospital bed    Recommendations for Other Services      Precautions / Restrictions Precautions Precautions: Fall Precaution Comments: Direct anterior approach - No hip precautions Restrictions Weight Bearing Restrictions: Yes RLE Weight Bearing: Weight bearing as tolerated       Mobility Bed Mobility Overal bed mobility: Needs Assistance;+2 for physical assistance Bed Mobility: Supine to Sit Rolling: +2 for physical assistance;Max assist   Supine to sit: Mod assist;+2 for physical assistance;HOB elevated     General bed mobility comments: pt does well initiating mobility and bringing his LEs to EOB, but then needs A for bringing LEs OOB and for bringing trunk up to sitting.    Transfers Overall transfer level: Needs assistance Equipment used: 2 person hand held assist Transfers: Sit to/from [employer] CorporationStand;Stand Pivot Transfers Sit to Stand: Mod assist;+2 physical assistance Stand pivot transfers: Total assist;+2 physical assistance       General transfer comment: pt does well with multimodal cueing and allowing pt to count to 3 to initiate soming to standing, but then froze during pivot and was unable to unweight L LE for pivotal steps to recliner.      Balance Overall balance assessment: Needs  assistance;History of Falls Sitting-balance support: Bilateral upper extremity supported;Single extremity supported;Feet supported Sitting balance-Leahy Scale: Poor     Standing balance support: During functional activity Standing balance-Leahy Scale: Poor                     ADL       Grooming: Sitting;Minimal assistance;Wash/dry hands;Wash/dry face       Lower Body Bathing: Maximal assistance;Sitting/lateral leans;Moderate assistance           Toilet Transfer: +2 for physical assistance;Moderate assistance Toilet Transfer Details (indicate cue type and reason): simulated bed to recliner Toileting- Clothing Manipulation and Hygiene: Total assistance                Vision  no change from baseline                              Cognition   Behavior During Therapy: [facility]FL for tasks assessed/performed Overall Cognitive Status: Difficult to assess                  General Comments: pt extremely hard of hearing, so difficult to tell if he can't hear or is truly having difficulty following directions and staying on task.      Extremity/Trunk Assessment   generalized weakness                        General Comments  Pt very pleasant and cooperative, jovial    Pertinent Vitals/ Pain       Pain Assessment: Faces Faces Pain Scale: Hurts little more  Pain Location: R hip Pain Descriptors / Indicators: Grimacing;Guarding Pain Intervention(s): Monitored during session;Premedicated before session;Repositioned                                                          Frequency Min 2X/week     Progress Toward Goals  OT Goals(current goals can now be found in the care plan section)  Progress towards OT goals: Progressing toward goals  Acute Rehab OT Goals Patient Stated Goal: decrease pain  Plan Discharge plan remains appropriate    Co-evaluation    PT/OT/SLP Co-Evaluation/Treatment: Yes Reason for  Co-Treatment: For patient/therapist safety PT goals addressed during session: Mobility/safety with mobility;Balance OT goals addressed during session: ADL's and self-care;Proper use of Adaptive equipment and DME      End of Session Equipment Utilized During Treatment: Gait belt   Activity Tolerance Patient tolerated treatment well   Patient Left in chair;with call bell/phone within reach;with family/visitor present             Time: [id number] OT Time Calculation (min): 26 min  Charges: OT General Charges $OT Visit: 1 Procedure OT Treatments $Therapeutic Activity: 8-22 mins  Galen ManilaSpencer, Leara Rawl Jeanette 08/28/2015, 2:20 PM

## 2015-08-29 DIAGNOSIS — R338 Other retention of urine: Secondary | ICD-10-CM

## 2015-08-29 DIAGNOSIS — D509 Iron deficiency anemia, unspecified: Secondary | ICD-10-CM

## 2015-08-29 DIAGNOSIS — I480 Paroxysmal atrial fibrillation: Secondary | ICD-10-CM

## 2015-08-29 DIAGNOSIS — I484 Atypical atrial flutter: Secondary | ICD-10-CM

## 2015-08-29 MED ORDER — METOPROLOL TARTRATE 25 MG PO TABS
25.0000 mg | ORAL_TABLET | Freq: Two times a day (BID) | ORAL | Status: DC
  Administered 2015-08-29 – 2015-08-30 (×3): 25 mg via ORAL
  Filled 2015-08-29 (×4): qty 1

## 2015-08-29 MED ORDER — HYDROCODONE-ACETAMINOPHEN 5-325 MG PO TABS
1.0000 | ORAL_TABLET | Freq: Four times a day (QID) | ORAL | Status: DC | PRN
  Administered 2015-08-29 – 2015-08-30 (×3): 1 via ORAL
  Filled 2015-08-29 (×3): qty 1

## 2015-08-29 MED ORDER — RIVAROXABAN 10 MG PO TABS: 10.0000 mg | ORAL_TABLET | Freq: Every day | ORAL | 0 refills | Status: DC

## 2015-08-29 MED ORDER — HYDROCODONE-ACETAMINOPHEN 5-325 MG PO TABS: 1.0000 | ORAL_TABLET | Freq: Four times a day (QID) | ORAL | Status: DC | PRN

## 2015-08-29 MED ORDER — HYDROCODONE-ACETAMINOPHEN 5-325 MG PO TABS: 1.0000 | ORAL_TABLET | Freq: Four times a day (QID) | ORAL | 0 refills | Status: DC | PRN

## 2015-08-29 NOTE — Plan of Care (Signed)
Problem: Physical Regulation: Goal: Will remain free from infection Outcome: Progressing No S?S of infection noted, body temperature WNL, VS WNL Goal: Postoperative complications will be avoided or minimized Outcome: Progressing No post op. Complications noted  Problem: Self-Concept: Goal: Verbalizations of decreased anxiety will increase Outcome: Progressing Patient is calm, relaxed, no anxiety noted  Problem: Pain Management: Goal: Pain level will decrease Outcome: Progressing Medicated once for pain with full relief

## 2015-08-29 NOTE — Consult Note (Signed)
Cardiology Consult    Patient ID: PARKER WHERLEY MRN: [id number], DOB/AGE: [birth year]   Admit date: 08/23/2015 Date of Consult: 08/29/2015  Primary Physician: Ginette Otto, MD Reason for Consult: Afib Primary Cardiologist: Dr. Eden Emms, last saw in 2014 Requesting Provider: Dr. Malachi Bonds  Patient Profile  Mr. Gloor is a 82 year old male with a past medical history TIA and CKD stage II. He has CAD listed in his history but denies ever having an MI or stent placed. Patient presented to the ED on 08/23/15 after a mechanical fall at home. He was found to have a right femoral neck fracture and was in atrial fibrillation on arrival to the ED. This was a new diagnosis for him.   History of Present Illness  Mr. Basista underwent an anterior approach hemi hip arthroplasty on 08/24/15. He continued to have atrial fibrillation postoperatively. He was started on a diltiazem and metoprolol 25 mg daily for rate control. He has been difficult to rate controlled as he has episodes of atrial fibrillation versus atrial flutter with rates in the 150s.   He was started on Xarelto for anticoagulation. His daughter reported that approximately one month ago he had a bowel movement that had evidence of blood in it however, it was felt that since he had no evidence of GI bleed during this admission that the benefit of anticoagulation outweighed the risks.   Review of telemetry shows atrial fibrillation with periods of rapid ventricular response with rates as high as 140s to 150s. EKG is consistent with this. He had an echocardiogram this admission that showed an EF of 55% and mild to moderate tricuspid regurgitation. Left atrium was mildly dilated.  He tells me that he does feels palpitations at times, especially when he is in pain. He denies chest pain and SOB. It is somewhat difficult to obtain a history from him as his speech is somewhat impaired. He is intermittently confused at times. He has a POA at  bedside, Connye Burkitt who says that she cares for the patient at home. She tells me that he has never complained of any chest pain or palpitations prior to admission.   Past Medical History   Past Medical History:  Diagnosis Date  . Arthropathy, unspecified, site unspecified   . BPH with obstruction/lower urinary tract symptoms   . Cataract    cortical senile  . Cerebrovascular disease, unspecified   . Chronic renal insufficiency, stage II (mild)   . Chronic rhinitis   . Chronic venous insufficiency   . Coronary atherosclerosis of unspecified type of vessel, native or graft    s/p stent placement "decades ago", hx of cath after this which showed patent stent per pt report.  Marland Kitchen DEPRESSION 12/06/2006   Qualifier: Diagnosis of  By: Roxan Hockey CMA, Shanda Bumps    . Depressive disorder, not elsewhere classified   . Hearing loss of both ears   . History of actinic keratoses 08/2011  . History of iron deficiency anemia 2013   Pt declined GI eval on multiple occasions  . Lichen simplex chronicus 08/2011  . Lumbar degenerative disc disease 06/2009   Mild osteoarthritic changes of L-spine diffusely on x-rays  . Memory loss   . Osteoarthritis 06/2009   X-rays: both hips-mild  . Other and unspecified hyperlipidemia   . Rectal bleeding 2015   on/off x 3 mo; saw Dr. Dulce Sellar with [facility]; CT abd/pelvis + Anusol HC supp regimen rx'd.  It appears the CT was ordered but not done (as  of 02/20/14)  . TRANSIENT ISCHEMIC ATTACK, HX OF 12/06/2006   Qualifier: Diagnosis of  By: Roxan Hockey CMA, Shanda Bumps    . Urticarial dermatitis    Skin biopsy 11/2010 favored "urticarial allergic rxn related to localized hypersensitivity such as insect bite or arthropod assault"  . Venous stasis dermatitis, left 07/2007   Biopsy showed angiodermatitis, with all tests for infection NEG (fungal stain, AFB stain, and gram stain).    Past Surgical History:  Procedure Laterality Date  . ANTERIOR APPROACH HEMI HIP ARTHROPLASTY Right 08/24/2015    Procedure: ANTERIOR APPROACH HEMI HIP ARTHROPLASTY;  Surgeon: Cammy Copa, MD;  Location: [facility] OR;  Service: Orthopedics;  Laterality: Right;  . APPENDECTOMY    . CATARACT EXTRACTION  10/24/2007   right  . CATARACT EXTRACTION  11/07/2007   left  . CORONARY ANGIOPLASTY WITH STENT PLACEMENT    . TONSILLECTOMY       Allergies  Allergies  Allergen Reactions  . Grapefruit Concentrate Other (See Comments)    Cannot take with several of his medications  . Amoxicillin Rash  . Penicillins Rash    Has patient had a PCN reaction causing immediate rash, facial/tongue/throat swelling, SOB or lightheadedness with hypotension: Yes Has patient had a PCN reaction causing severe rash involving mucus membranes or skin necrosis: Unknown Has patient had a PCN reaction that required hospitalization: Unknown Has patient had a PCN reaction occurring within the last 10 years: No If all of the above answers are "NO", then may proceed with Cephalosporin use.     Inpatient Medications    . antiseptic oral rinse  7 mL Mouth Rinse BID  . atorvastatin  40 mg Oral q1800  . citalopram  20 mg Oral Daily  . digoxin  0.0625 mg Oral Daily  . diltiazem  120 mg Oral Daily  . feeding supplement (ENSURE ENLIVE)  237 mL Oral Q1500  . ferrous sulfate  325 mg Oral BID  . fluticasone  1 spray Each Nare Daily  . metoprolol succinate  25 mg Oral Daily  . mirabegron ER  25 mg Oral Daily  . pantoprazole  40 mg Oral Daily  . polycarbophil  625 mg Oral Daily  . rivaroxaban  10 mg Oral Q breakfast  . tamsulosin  0.4 mg Oral Daily    Family History    Family History  Problem Relation Age of Onset  . Heart disease Father 28  . Hypertension Mother   . Dementia Sister   . Colon cancer Neg Hx     Social History    Social History   Social History  . Marital status: Single    Spouse name: N/A  . Number of children: N/A  . Years of education: N/A   Occupational History  . Retired Retired   Social History  Main Topics  . Smoking status: Former Smoker    Types: Pipe  . Smokeless tobacco: Never Used     Comment: Quit > 23 yrs ago.  . Alcohol use 10.5 oz/week    21 Standard drinks or equivalent per week  . Drug use: No  . Sexual activity: No   Other Topics Concern  . 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 Married 2047-05-26- 29- divorced, married 1978/05/26- 9 years-divorce, married 1990/05/26-  10-divorce. Had a companion who died.1 son - 04/25/2056 dtrs - 05-26-51, 26-May-2058, '62, '64; 6 grandchildren.  7 great-grands. 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 (12/06/2006). ACP - he thinks he has a living will. For now he wishes to have CPR, short-term mechanical ventilation  POA dtr - Mahalia Longestat Spargo  (c) [phone]     Review of Systems    General:  No chills, fever, night sweats or weight changes.  Cardiovascular:  No chest pain, dyspnea on exertion, edema, orthopnea, + palpitations, paroxysmal nocturnal dyspnea. Dermatological: No rash, lesions/masses Respiratory: No cough, dyspnea Urologic: No hematuria, dysuria Abdominal:   No nausea, vomiting, diarrhea, bright red blood per rectum, melena, or hematemesis Neurologic:  No visual changes, wkns, changes in mental status. All other systems reviewed and are otherwise negative except as noted above.  Physical Exam    Blood pressure 129/77, pulse (!) 145, temperature 98.6 F (37 C), temperature source Oral, resp. rate 20, height 5\' 11"  (1.803 m), weight 179 lb 14.3 oz (81.6 kg), SpO2 95 %.  General: Pleasant, NAD Psych: Normal affect. Neuro: Confused, oriented to self, disoriented to situation. Moves all extremities spontaneously. HEENT: Normal  Neck: Supple without bruits or JVD. Lungs:  Resp regular and unlabored, CTA. Heart: IRRR no s3, s4, or murmurs. Abdomen: Soft, non-tender, non-distended, BS + x 4.  Extremities: No clubbing, cyanosis or edema. DP/PT/Radials 2+ and  equal bilaterally. Right hip incision.   Labs    Troponin [facility](Point of Care Test)  Lab Results  Component Value Date   WBC 6.6 08/27/2015   HGB 10.5 (L) 08/27/2015   HCT 34.1 (L) 08/27/2015   MCV 98.3 08/27/2015   PLT 124 (L) 08/27/2015    Recent Labs Lab 08/26/15 0250 08/27/15 0517  NA 138 139  K 3.8 3.4*  CL 113* 108  CO2 20* 25  BUN 15 20  CREATININE 0.88 0.88  CALCIUM 8.0* 8.2*  PROT 5.5*  --   BILITOT 1.5*  --   ALKPHOS 57  --   ALT 13*  --   AST 21  --   GLUCOSE 126* 110*   Lab Results  Component Value Date   CHOL 139 04/30/2014   HDL 44.90 04/30/2014   LDLCALC 74 04/30/2014   TRIG 102.0 04/30/2014      Radiology Studies    Dg Elbow Complete Right (3+view)  Result Date: 08/23/2015 CLINICAL DATA:  Fall today. Right hip fracture. Abrasion to the right elbow. EXAM: RIGHT ELBOW - COMPLETE 3+ VIEW COMPARISON:  None. FINDINGS: Right elbow is located. There is no significant effusion. No acute fracture is present. Soft tissue swelling is present posterior to the olecranon. IMPRESSION: Soft tissue swelling posterior to the elbow without underlying fracture. Electronically Signed   By: Marin Robertshristopher  Mattern M.D.   On: 08/23/2015 19:14   Chest Portable 1 View  Result Date: 08/23/2015 CLINICAL DATA:  Fall EXAM: PORTABLE CHEST 1 VIEW COMPARISON:  05/25/2011 FINDINGS: Cardiomediastinal silhouette is stable. No acute infiltrate or pleural effusion. No pulmonary edema. Stable left basilar scarring. IMPRESSION: No active disease. Electronically Signed   By: Natasha MeadLiviu  Pop M.D.   On: 08/23/2015 22:13   Dg C-arm 61-120 Min  Result Date: 08/24/2015 CLINICAL DATA:  Right hip arthroplasty EXAM: OPERATIVE RIGHT HIP WITH PELVIS; DG C-ARM 61-120 MIN COMPARISON:  None. FLUOROSCOPY TIME:  Radiation Exposure Index (as provided by the fluoroscopic device): Not available If the device does not provide the exposure index: Fluoroscopy Time:  18 seconds Number of Acquired Images:  2 FINDINGS:  Right hip arthroplasty is noted in satisfactory position. No acute soft  tissue or bony abnormality is noted. IMPRESSION: Status post right hip replacement Electronically Signed   By: Alcide Clever M.D.   On: 08/24/2015 16:55   Dg Hip Port Unilat With Pelvis 1v Right  Result Date: 08/24/2015 CLINICAL DATA:  hip fracture, postop EXAM: DG HIP (WITH OR WITHOUT PELVIS) 1V PORT RIGHT COMPARISON:  Intraoperative film same day FINDINGS: Two views of the right hip submitted included frontal view of the pelvis. There is right hip prosthesis with anatomic alignment. Lateral skin staples are noted. Postsurgical changes with periarticular soft tissue air. IMPRESSION: Right hip prosthesis with anatomic alignment. Electronically Signed   By: Natasha Mead M.D.   On: 08/24/2015 17:26   Dg Hip Operative Unilat W Or W/o Pelvis Right  Result Date: 08/24/2015 CLINICAL DATA:  Right hip arthroplasty EXAM: OPERATIVE RIGHT HIP WITH PELVIS; DG C-ARM 61-120 MIN COMPARISON:  None. FLUOROSCOPY TIME:  Radiation Exposure Index (as provided by the fluoroscopic device): Not available If the device does not provide the exposure index: Fluoroscopy Time:  18 seconds Number of Acquired Images:  2 FINDINGS: Right hip arthroplasty is noted in satisfactory position. No acute soft tissue or bony abnormality is noted. IMPRESSION: Status post right hip replacement Electronically Signed   By: Alcide Clever M.D.   On: 08/24/2015 16:55   Dg Hip Unilat With Pelvis 2-3 Views Right  Result Date: 08/23/2015 CLINICAL DATA:  Fall this afternoon while taking out the trash. Acute onset of right hip pain. EXAM: DG HIP (WITH OR WITHOUT PELVIS) 2-3V RIGHT COMPARISON:  None. FINDINGS: A right subcapital femoral neck fracture is present. Increased varus angulation and slight foreshortening is present. The femoral head is located. The pelvis is otherwise intact. Degenerative changes are present in the lower lumbar spine. IMPRESSION: 1. Right subcapital femoral neck  fracture with increased angulation. Electronically Signed   By: Marin Roberts M.D.   On: 08/23/2015 19:13    EKG & Cardiac Imaging    EKG:  Atrial fib   Echocardiogram: 08/25/15 Study Conclusions  - Left ventricle: The cavity size was normal. Wall thickness was at   the upper limits of normal. The estimated ejection fraction was   55%. The study is not technically sufficient to allow evaluation   of LV diastolic function. - Aortic valve: Mildly calcified annulus. Trileaflet. - Mitral valve: Mildly thickened leaflets . There was trivial   regurgitation. - Left atrium: The atrium was mildly dilated. - Right ventricle: Systolic function was low normal. - Right atrium: The atrium was mildly dilated. - Tricuspid valve: There was mild-moderate regurgitation. Peak   RV-RA gradient (S): 40 mm Hg. - Pericardium, extracardiac: There was no pericardial effusion.  Impressions:  - Upper normal LV wall thickness with LVEF approximately 55%.   Indeterminate diastolic function. Mild left atrial enlargement.   Mildly thickened mitral leaflets with trivial mitral   regurgitation. Mildly sclerotic aortic valve. Low normal RV   contraction. Mild to moderate tricuspid regurgitation. RV-RA   gradient 40 mmHg.   Assessment & Plan    1. Atrial fibrillation with RVR: Presented with hip fracture and new diagnosis of atrial fibrillation, EKG on arrival to ED showed atrial flutter with rate of 80. He developed Afib RVR post operatively and was started on diltiazem gtt and transitioned to oral diltiazem 120mg  daily. He also was started on Metoprolol XL 25mg  daily. He has been somewhat difficult to rate control as he continues to have break through periods of Afib RVR with rates in 150's. He  was started on digoxin yesterday, and continues to have elevated rates at times today.   I would favor titrating up his beta blocker and dosing it twice daily. Would try 25mg  metoprolol BID. He has stable BP  and at times is hypertensive. Would try to avoid antiarrhythmics for now, as he might chemically cardiovert and he has not previously been on anticoagulation. Another option would be to increase his Cardizem to 180mg  and continue to titrate up as BP tolerates.   This patients CHA2DS2-VASc Score and unadjusted Ischemic Stroke Rate (% per year) is equal to 7.2 % stroke rate/year from a score of 5 Above score calculated as 1 point each if present [CHF, HTN, DM, Vascular=MI/PAD/Aortic Plaque, Age if 65-74, or Male], 2 points each if present [Age > 75, or Stroke/TIA/TE]   Continue Xarelto for anticoagulation.   Echo shows EF of 55%. Not technically sufficient to evaluate diastolic dysfunction, but does not appear volume overloaded on exam.   TSH normal.   2. HTN: On beta blocker, diltiazem. According to chart has had some intermittent hypotension, but BP appears stable now.   3. Right femoral neck fracture s/p arthroplasty: Management per Ortho. They have ok'd him for discharge.   4. Encephalopathy: Management per primary team.      Signed, Little Ishikawa, NP 08/29/2015, 1:13 PM Pager: [phone]   Attending Note:   The patient was seen and examined.  Agree with assessment and plan as noted above.  Changes made to the above note as needed.  Patient seen and independently examined with Suzzette Righter, NP.   We discussed all aspects of the encounter. I agree with the assessment and plan as stated above.  Pt is a relatively healthy elderly man Admitted with hip fracture Found to have paroxysmal atrial fib / flutter  Has been on low dose metoprolol and diltiazem and digoxin Still has elevated HR when he is in pain   Will try to simplify his medical regimine Increase metoprolol to 25 BID  Will stop dilt and dig for now   He has NSR with 1st degree AV block when he is in sinus rhythm.    Has episodes of a-flutter with HR of 150 ( usually when he is in pain or has other distress )    Agree with Xarelto 20 mg a day with meals    I have spent a total of 40 minutes with patient reviewing hospital  notes , telemetry, EKGs, labs and examining patient as well as establishing an assessment and plan that was discussed with the patient. > 50% of time was spent in direct patient care.    Vesta Mixer, Montez Hageman., MD, [facility] 08/29/2015, 2:52 PM [address]  [address] Office [phone] Pager [phone]

## 2015-08-29 NOTE — Progress Notes (Signed)
Pt HR staining in 140's Dr. Malachi BondsShort notified and she said cardiology has been notified and on their way up to see pt. Will continue to closely monitor pt. Dionne BucyP. Amo Zeki Bedrosian RN

## 2015-08-29 NOTE — Progress Notes (Signed)
PROGRESS NOTE  Jeff Hunt  BMW:[id number] DOB: 05/01/1925 DOA: 08/23/2015 PCP: Ginette OttoSTONEKING,HAL THOMAS, MD  Brief Narrative:   82yo man presented after a mechanical fall at home.  He was diagnosed with a right subcapital femoral neck fracture.  On admission he was found to be in Afib with RVR, this is a new diagnosis, but unclear duration given last EKG in the system is from 2014.  He reported no history of MI, CAD, Heart failure or TIA, though they are listed in his history.  Orthopedics performed anterior approach right hip hemiarthroplasty on 8/19. Postoperatively, he was rate controlled on diltiazem infusion and transitioned eventually to oral diltiazem. He had recurrent atrial fibrillation versus possible a flutter the morning of 8/23.  Assessment & Plan:   Principal Problem:   Femoral neck fracture (HCC) Active Problems:   Coronary atherosclerosis   Anemia, iron deficiency   Peripheral edema   HTN (hypertension), benign   Atrial fibrillation (HCC)   Fall   Hip fracture (HCC)  Femoral neck fracture due to mechanical fall at home 8/19 status post right hemiarthroplasty by Dr. August Saucerean on 08/24/2015. - Physical therapy recommending skilled nursing facility  Encephalopathy, likely multifactorial - age, medications, new environment, improving - Daughter at bedside  Atrial fibrillation and appears to be having some episodes of atrial flutter. Rate approximately 140-150 bpm this morning again.  Blood pressure low normal to mildly hypotensive.   - TSH (1.621) -  Trop negative - Transthoracic echocardiogram performed 08/25/2015 showed preserved ejection fraction 55% - Continue oral diltiazem 120 mg by mouth daily - continue metoprolol - Digoxin did not help - CHADS-VASC score of 5  - Xarelto  - Cardiology consult to assist with rate control    Recent history of GI bleed - His daughter reporting that he had a large bloody bowel movement about a month ago for which he never  sought medical attention -He has not had evidence of GI bleed on this hospitalization -With him follow-up with GI in the outpatient setting. - Repeat CBC in 1 week postdischarge to follow-up anemia    Peripheral edema - Holding acetazolamide in acute setting - Renal function stable.     HTN (hypertension), blood pressure improved today   Acute urinary retention -  Foley placed after I/O cath x 3 -  Plan to d/c with foley and follow up with Urology  DVT prophylaxis:  Xarelto Code Status:  Full Family Communication:  Patient and daughter Disposition Plan:  To skilled nursing facility pending improvement in heart rate   Consultants:   Orthopedic surgery, Dr. August Saucerean  Procedures:  Right hip hemiarthroplasty 8/19  Antimicrobials:   none    Subjective: Confused.    Objective: Vitals:   08/29/15 0425 08/29/15 1100 08/29/15 1300 08/29/15 1635  BP: (!) 144/78 129/77 130/87 118/67  Pulse: 88 (!) 145 (!) 145 77  Resp: 18 20 20    Temp: 98 F (36.7 C) 98.6 F (37 C) 97.4 F (36.3 C)   TempSrc: Oral Oral Oral   SpO2: 96% 95% 94%   Weight:      Height:        Intake/Output Summary (Last 24 hours) at 08/29/15 1715 Last data filed at 08/29/15 1500  Gross per 24 hour  Intake              240 ml  Output             1225 ml  Net             -  985 ml   Filed Weights   08/23/15 1928 08/24/15 2100  Weight: 81.6 kg (180 lb) 81.6 kg (179 lb 14.3 oz)    Examination:  General exam:  Adult male.  No acute distress.  HEENT:  NCAT, MMM Respiratory system: Clear to auscultation bilaterally Cardiovascular system:  IRRR and tachycardic, normal S1/S2. No murmurs, rubs, gallops or clicks.  Warm extremities Gastrointestinal system: Normal active bowel sounds, soft, nondistended, nontender. MSK:  Normal tone and bulk, no lower extremity edema.  Right anterior hip bandage with trace serosanguinous, mild swelling but no palpable hematoma Neuro:  Grossly intact. Sensation on right foot  intact, able to wiggle toes on right foot.      Data Reviewed: I have personally reviewed following labs and imaging studies  CBC:  Recent Labs Lab 08/23/15 2013 08/24/15 0410 08/25/15 0839 08/26/15 0250 08/27/15 0517  WBC 9.3 7.4 7.1 7.3 6.6  NEUTROABS 7.8*  --   --   --   --   HGB 14.1 12.7* 11.9* 11.2* 10.5*  HCT 44.5 40.3 37.7* 36.6* 34.1*  MCV 99.1 97.8 97.9 98.4 98.3  PLT 162 148* 116* 119* 124*   Basic Metabolic Panel:  Recent Labs Lab 08/23/15 2013 08/24/15 0410 08/25/15 0839 08/26/15 0250 08/27/15 0517  NA 139 138 136 138 139  K 4.0 4.0 4.1 3.8 3.4*  CL 112* 113* 112* 113* 108  CO2 20* 20* 20* 20* 25  GLUCOSE 116* 156* 145* 126* 110*  BUN 15 17 16 15 20   CREATININE 0.98 0.92 0.97 0.88 0.88  CALCIUM 9.1 8.8* 8.1* 8.0* 8.2*   GFR: Estimated Creatinine Clearance: 61.8 mL/min (by C-G formula based on SCr of 0.88 mg/dL). Liver Function Tests:  Recent Labs Lab 08/23/15 2013 08/26/15 0250  AST 27 21  ALT 22 13*  ALKPHOS 76 57  BILITOT 0.4 1.5*  PROT 6.9 5.5*  ALBUMIN 4.0 2.7*   No results for input(s): LIPASE, AMYLASE in the last 168 hours. No results for input(s): AMMONIA in the last 168 hours. Coagulation Profile:  Recent Labs Lab 08/23/15 2243  INR 1.07   Cardiac Enzymes:  Recent Labs Lab 08/23/15 2243 08/24/15 0410 08/24/15 1019  TROPONINI <0.03 <0.03 <0.03   BNP (last 3 results) No results for input(s): PROBNP in the last 8760 hours. HbA1C: No results for input(s): HGBA1C in the last 72 hours. CBG:  Recent Labs Lab 08/27/15 1130  GLUCAP 151*   Lipid Profile: No results for input(s): CHOL, HDL, LDLCALC, TRIG, CHOLHDL, LDLDIRECT in the last 72 hours. Thyroid Function Tests: No results for input(s): TSH, T4TOTAL, FREET4, T3FREE, THYROIDAB in the last 72 hours. Anemia Panel: No results for input(s): VITAMINB12, FOLATE, FERRITIN, TIBC, IRON, RETICCTPCT in the last 72 hours. Urine analysis:    Component Value Date/Time    COLORURINE YELLOW 08/24/2015 1023   APPEARANCEUR CLOUDY (A) 08/24/2015 1023   LABSPEC 1.020 08/24/2015 1023   PHURINE 7.0 08/24/2015 1023   GLUCOSEU NEGATIVE 08/24/2015 1023   HGBUR NEGATIVE 08/24/2015 1023   BILIRUBINUR NEGATIVE 08/24/2015 1023   KETONESUR NEGATIVE 08/24/2015 1023   PROTEINUR NEGATIVE 08/24/2015 1023   NITRITE NEGATIVE 08/24/2015 1023   LEUKOCYTESUR SMALL (A) 08/24/2015 1023   Sepsis Labs: @LABRCNTIP (procalcitonin:4,lacticidven:4)  ) Recent Results (from the past 240 hour(s))  Surgical pcr screen     Status: Abnormal   Collection Time: 08/24/15  5:33 AM  Result Value Ref Range Status   MRSA, PCR NEGATIVE NEGATIVE Final   Staphylococcus aureus POSITIVE (A) NEGATIVE Final  Comment:        The Xpert SA Assay (FDA approved for NASAL specimens in patients over 86 years of age), is one component of a comprehensive surveillance program.  Test performance has been validated by [facility] for patients greater than or equal to 65 year old. It is not intended to diagnose infection nor to guide or monitor treatment.       Radiology Studies: No results found.   Scheduled Meds: . antiseptic oral rinse  7 mL Mouth Rinse BID  . atorvastatin  40 mg Oral q1800  . citalopram  20 mg Oral Daily  . feeding supplement (ENSURE ENLIVE)  237 mL Oral Q1500  . ferrous sulfate  325 mg Oral BID  . fluticasone  1 spray Each Nare Daily  . metoprolol tartrate  25 mg Oral BID  . mirabegron ER  25 mg Oral Daily  . pantoprazole  40 mg Oral Daily  . polycarbophil  625 mg Oral Daily  . rivaroxaban  10 mg Oral Q breakfast  . tamsulosin  0.4 mg Oral Daily   Continuous Infusions:    LOS: 6 days    Time spent: 30 min    Renae Fickle, MD [facility] Pager [phone]  If 7PM-7AM, please contact night-coverage [email or web address] Password [facility] 08/29/2015, 5:15 PM

## 2015-08-29 NOTE — Progress Notes (Signed)
Patient was unable to urinate,he was catheterized last at 1630 08/28/15, bladder scanned with 500 ml,  # 16 French Foley catheter is inserted per MD's order, 450 ml  Of amber concentrated urine out, patient tolerated well.

## 2015-08-29 NOTE — Progress Notes (Signed)
Pt stable Dressing changed - incision ok Plan for dc today

## 2015-08-30 ENCOUNTER — Encounter: Payer: Self-pay | Admitting: Gastroenterology

## 2015-08-30 DIAGNOSIS — I4891 Unspecified atrial fibrillation: Secondary | ICD-10-CM | POA: Diagnosis not present

## 2015-08-30 DIAGNOSIS — N182 Chronic kidney disease, stage 2 (mild): Secondary | ICD-10-CM | POA: Diagnosis not present

## 2015-08-30 DIAGNOSIS — K210 Gastro-esophageal reflux disease with esophagitis: Secondary | ICD-10-CM | POA: Diagnosis not present

## 2015-08-30 DIAGNOSIS — R262 Difficulty in walking, not elsewhere classified: Secondary | ICD-10-CM | POA: Diagnosis not present

## 2015-08-30 DIAGNOSIS — L8990 Pressure ulcer of unspecified site, unspecified stage: Secondary | ICD-10-CM

## 2015-08-30 DIAGNOSIS — R1312 Dysphagia, oropharyngeal phase: Secondary | ICD-10-CM | POA: Diagnosis not present

## 2015-08-30 DIAGNOSIS — R609 Edema, unspecified: Secondary | ICD-10-CM | POA: Diagnosis not present

## 2015-08-30 DIAGNOSIS — Z471 Aftercare following joint replacement surgery: Secondary | ICD-10-CM | POA: Diagnosis not present

## 2015-08-30 DIAGNOSIS — M25551 Pain in right hip: Secondary | ICD-10-CM | POA: Diagnosis not present

## 2015-08-30 DIAGNOSIS — L89011 Pressure ulcer of right elbow, stage 1: Secondary | ICD-10-CM | POA: Diagnosis not present

## 2015-08-30 DIAGNOSIS — R5381 Other malaise: Secondary | ICD-10-CM | POA: Diagnosis not present

## 2015-08-30 DIAGNOSIS — I2510 Atherosclerotic heart disease of native coronary artery without angina pectoris: Secondary | ICD-10-CM | POA: Diagnosis not present

## 2015-08-30 DIAGNOSIS — R338 Other retention of urine: Secondary | ICD-10-CM

## 2015-08-30 DIAGNOSIS — E785 Hyperlipidemia, unspecified: Secondary | ICD-10-CM | POA: Diagnosis not present

## 2015-08-30 DIAGNOSIS — M6281 Muscle weakness (generalized): Secondary | ICD-10-CM | POA: Diagnosis not present

## 2015-08-30 DIAGNOSIS — I480 Paroxysmal atrial fibrillation: Secondary | ICD-10-CM | POA: Diagnosis not present

## 2015-08-30 DIAGNOSIS — I959 Hypotension, unspecified: Secondary | ICD-10-CM | POA: Diagnosis not present

## 2015-08-30 DIAGNOSIS — D509 Iron deficiency anemia, unspecified: Secondary | ICD-10-CM | POA: Diagnosis not present

## 2015-08-30 DIAGNOSIS — S72011D Unspecified intracapsular fracture of right femur, subsequent encounter for closed fracture with routine healing: Secondary | ICD-10-CM | POA: Diagnosis not present

## 2015-08-30 DIAGNOSIS — N401 Enlarged prostate with lower urinary tract symptoms: Secondary | ICD-10-CM | POA: Diagnosis not present

## 2015-08-30 DIAGNOSIS — Z9181 History of falling: Secondary | ICD-10-CM | POA: Diagnosis not present

## 2015-08-30 DIAGNOSIS — S72001A Fracture of unspecified part of neck of right femur, initial encounter for closed fracture: Secondary | ICD-10-CM | POA: Diagnosis not present

## 2015-08-30 DIAGNOSIS — H918X3 Other specified hearing loss, bilateral: Secondary | ICD-10-CM | POA: Diagnosis not present

## 2015-08-30 DIAGNOSIS — I10 Essential (primary) hypertension: Secondary | ICD-10-CM | POA: Diagnosis not present

## 2015-08-30 DIAGNOSIS — M1990 Unspecified osteoarthritis, unspecified site: Secondary | ICD-10-CM | POA: Diagnosis not present

## 2015-08-30 MED ORDER — RIVAROXABAN 20 MG PO TABS: 20.0000 mg | ORAL_TABLET | Freq: Every day | ORAL | Status: DC

## 2015-08-30 MED ORDER — RIVAROXABAN 20 MG PO TABS: 20.0000 mg | ORAL_TABLET | Freq: Every day | ORAL | 0 refills | Status: AC

## 2015-08-30 MED ORDER — METOPROLOL TARTRATE 25 MG PO TABS: 25.0000 mg | ORAL_TABLET | Freq: Two times a day (BID) | ORAL | 0 refills | Status: DC

## 2015-08-30 MED ORDER — RIVAROXABAN 10 MG PO TABS
10.0000 mg | ORAL_TABLET | Freq: Once | ORAL | Status: AC
  Administered 2015-08-30: 10 mg via ORAL
  Filled 2015-08-30: qty 1

## 2015-08-30 NOTE — Progress Notes (Signed)
PROGRESS NOTE  Subjective:   82 year old male with a past medical history TIA and CKD stage II. He has CAD listed in his history but denies ever having an MI or stent placed. Patient presented to the ED on 08/23/15 after a mechanical fall at home. He was found to have a right femoral neck fracture and was in atrial fibrillation on arrival to the ED. This was a new diagnosis for him.  He is feeling better Seems to have some dementia. No complaints   Objective:    Vital Signs:   Temp:  [97.4 F (36.3 C)-98.6 F (37 C)] 97.7 F (36.5 C) (08/25 0526) Pulse Rate:  [72-145] 72 (08/25 0526) Resp:  [15-20] 15 (08/25 0526) BP: (118-130)/(65-87) 121/65 (08/25 0526) SpO2:  [92 %-97 %] 92 % (08/25 0526)  Last BM Date: 08/23/15 (last documented)   24-hour weight change: Weight change:   Weight trends: Filed Weights   08/23/15 1928 08/24/15 2100  Weight: 180 lb (81.6 kg) 179 lb 14.3 oz (81.6 kg)    Intake/Output:  08/24 0701 - 08/25 0700 In: 920 [P.O.:920] Out: 1000 [Urine:1000] No intake/output data recorded.   Physical Exam: BP 121/65 (BP Location: Right Arm)   Pulse 72   Temp 97.7 F (36.5 C) (Oral)   Resp 15   Ht 5\' 11"  (1.803 m)   Wt 179 lb 14.3 oz (81.6 kg)   SpO2 92%   BMI 25.09 kg/m   Wt Readings from Last 3 Encounters:  08/24/15 179 lb 14.3 oz (81.6 kg)  04/30/14 200 lb (90.7 kg)  11/06/13 212 lb (96.2 kg)    General: Vital signs reviewed and noted. Elderly gentleman in NAD   Head: Normocephalic, atraumatic.  Eyes: conjunctivae/corneas clear.  EOM's intact.   Throat: normal  Neck:  normal   Lungs:    clear  Heart:  Irreg. Irreg   Abdomen:  Soft, non-tender, non-distended    Extremities: No c/c/3    Neurologic: A&O X3, CN II - XII are grossly intact.   Psych: Pleasant.   Seems to be a little demented.   Is hard of hearing which makes this assessment difficult     Labs: BMET: No results for input(s): NA, K, CL, CO2, GLUCOSE, BUN, CREATININE,  CALCIUM, MG, PHOS in the last 72 hours.  Liver function tests: No results for input(s): AST, ALT, ALKPHOS, BILITOT, PROT, ALBUMIN in the last 72 hours. No results for input(s): LIPASE, AMYLASE in the last 72 hours.  CBC: No results for input(s): WBC, NEUTROABS, HGB, HCT, MCV, PLT in the last 72 hours.  Cardiac Enzymes: No results for input(s): CKTOTAL, CKMB, TROPONINI in the last 72 hours.  Coagulation Studies: No results for input(s): LABPROT, INR in the last 72 hours.  Other: Invalid input(s): POCBNP No results for input(s): DDIMER in the last 72 hours. No results for input(s): HGBA1C in the last 72 hours. No results for input(s): CHOL, HDL, LDLCALC, TRIG, CHOLHDL in the last 72 hours. No results for input(s): TSH, T4TOTAL, T3FREE, THYROIDAB in the last 72 hours.  Invalid input(s): FREET3 No results for input(s): VITAMINB12, FOLATE, FERRITIN, TIBC, IRON, RETICCTPCT in the last 72 hours.   Other results:  tele  ( personally reviewed )  -   Atrial fib  Medications:    Infusions:    Scheduled Medications: . antiseptic oral rinse  7 mL Mouth Rinse BID  . atorvastatin  40 mg Oral q1800  . citalopram  20 mg  Oral Daily  . feeding supplement (ENSURE ENLIVE)  237 mL Oral Q1500  . ferrous sulfate  325 mg Oral BID  . fluticasone  1 spray Each Nare Daily  . metoprolol tartrate  25 mg Oral BID  . mirabegron ER  25 mg Oral Daily  . pantoprazole  40 mg Oral Daily  . polycarbophil  625 mg Oral Daily  . rivaroxaban  10 mg Oral Q breakfast  . tamsulosin  0.4 mg Oral Daily    Assessment/ Plan:   Principal Problem:   Femoral neck fracture (HCC) Active Problems:   Coronary atherosclerosis   Anemia, iron deficiency   Peripheral edema   HTN (hypertension), benign   Atrial fibrillation (HCC)   Fall   Hip fracture (HCC)   Acute urinary retention   Pressure ulcer  1. Paroxysmal atrial fib / Flutter:   Rate is well controlled. Continue current dose of metoprolol. May titrate  as needed.  Continue Xarelto 20 mg a day   2. Essential  HTN:    BP is well controlled.   Anticipate DC today   He may follow up with us  is in several weeks.      Disposition:  Length of Stay: 7  Vesta MixerPhilip J. Nahser, Montez HagemanJr., MD, [facility] 08/30/2015, 8:30 AM Office [phone] Pager [phone]

## 2015-08-30 NOTE — Progress Notes (Signed)
Pt discharge education and instructions completed. Pt IV and telemetry moved; pt discharge to SNF with [facility] to transport him to disposition. Pt RLE incision dsg remains clean, dry and intact with no active stain or bleeding noted. Pt foley remains clean, intact and unclampled. Bag emptied prior to pt leaving. [facility] Antionette.  Pt transported off unit via stretcher with belongings and caregiver at side. Dionne BucyP. Amo Yoali Conry RN

## 2015-08-30 NOTE — Progress Notes (Signed)
[facility] this afternoon by Crista CurbBrittney Whitaker, MSW after his friend Connye Burkittlly chose that facility from the bed offers received.  However, CSW ChiropodistAssistant Director, Wandra MannanZack Brooks, LCSW, was contacted by [facility] Coord[facility], Genia HaroldJanie Holden, upon Mr. Pond's arrival at the facility and informed that [facility] had not been informed of the decision to accept their facility's bed, and consequently they were not expecting him when he arrived by non-emergency ambulance.  Ms. Francesco RunnerHolden informed Mr. Shon BatonBrooks that her facility had subsequently offered Mr. Meiring beds on two different halls in their facility but that his friend had declined both, had declined to sign paperwork allowing [facility] to treat Mr. Herringshaw, and had requested that he be transferred to another facility.  Mr. Shon BatonBrooks and Ms. Whitaker then spoke with Connye BurkittAlly by telephone and Connye [facility]ittlly [facility].  Ms.Whitaker then confirmed that the bed was still available for Mr. Jeff Hunt and that [facility] could [facility] this [facility].  However, when she contacted [facility] to [facility] and was informed that they may not be able to transport the patient until 11 pm or midnight due to other pending transports, [facility] staff offered Mr. Douglass's friend the option of transporting him by car.  When she declined, [facility] staff [facility] and [facility], since they had no ability to treat him without signed admissions paperwork.    At approximately 2015 hours, the CSW Director was notified that EMS personnel would not transport Mr. Waldschmidt to the ED because he was in no apparent distress, and were not permitted to transport him from [facility].   CSW Interior and spatial designerDirector then spoke to [place]issa, Charity fundraiserN at [facility] to review the patient's condition and needs.  Melissa asked Mr. Jeff Hunt  if he was experiencing any pain or discomfort.  Patient denied pain at 2030 hours and requested an extra pillow.  He denied pain again at 2045 hours.  On both occasions he and Ally refused the offer to contact EMS once again to request transport to the ED.  Melissa reported that Connye Burkittlly stated that she understood they were waiting for non-emergency transport [facility].  At approximatley 2045 hours [facility] staff checked Mr. Halseth's heart and lungs and assessed him as stable and in no distress.  At 2125 hours Melissa, RN from [facility] notified CSW Director that Mr. Koral had left the facility [facility].  CSW Director then notified Stacy GardnerErin Davenport, LCSWA, [facility] CSW, who notified [facility].  [facility] staff confirmed that they were ready to receive him.    Rae HalstedHope Cheyeanne Roadcap, ACSW, Designer, industrial/productLCSW Director, Clinical Social Work [phone]

## 2015-08-30 NOTE — Discharge Summary (Addendum)
Physician Discharge Summary  Jeff Hunt ZOX:[id number] DOB: 1925/05/03 DOA: 08/23/2015  PCP: Ginette Otto, MD  Admit date: 08/23/2015 Discharge date: 08/30/2015  Admitted From: home  Disposition:  SNF  Recommendations for Outpatient Follow-up:  1. Follow up with PCP in 1-2 weeks 2. Please obtain BMP/CBC in one week 3. Cardiology follow up in 1 month for management of atrial fibrillation and flutter 4. Orthopedic surgery in 2 weeks for follow up surgery 5. [facility] referral for history of rectal bleeding 6. Urology in 2 weeks for urinary retention requiring foley catheter placement 7. PT/OT at SNF    Equipment/Devices:  Keep dressing clean, dry, and intact  Discharge Condition:  Stable, improved CODE STATUS:  FULL  Diet recommendation:  Healthy heart   Brief/Interim Summary:  82yo man presented after a mechanical fall at home. He was diagnosed with a right subcapital femoral neck fracture. On admission he was found to be in Afib with RVR, this is a new diagnosis, but unclear duration given last EKG in the system is from 2014. He reported no history of MI, CAD, Heart failure or TIA, though they are listed in his history. Orthopedics performed anterior approach right hip hemiarthroplasty on 8/19. Postoperatively, he was rate controlled on diltiazem infusion and transitioned eventually to oral diltiazem. He had recurrent atrial fibrillation versus possible a flutter the morning of 8/23.  Cardiology was consulted and his rate was finally controlled with metoprolol.    Discharge Diagnoses:  Principal Problem:   Femoral neck fracture (HCC) Active Problems:   Coronary atherosclerosis   Anemia, iron deficiency   Peripheral edema   HTN (hypertension), benign   Atrial fibrillation (HCC)   Fall   Hip fracture (HCC)   Acute urinary retention   Pressure ulcer  Femoral neck fracture due to mechanical fall at home 8/19 status post right hemiarthroplasty by  Dr. August Saucer on 08/24/2015. - Physical therapy recommending skilled nursing facility due to deconditioning from surgery - completed 7-days of prn narcotics for pain -  Tylenol as needed for pain -  Xarelto for DVT prophylaxis and a-fib  Metabolic encephalopathy, likely multifactorial - age, medications, new environment, recent surgery.  Improved.    Atrial fibrillation was present at admission and recurred post-operatively.  He appeared to be having brief episodes of atrial flutter.  TSH was 1.621.  His troponin was negative.  Transthoracic echocardiogram performed 08/25/2015 showed preserved ejection fraction 55%.  He initially responded to diltiazem gtt and was transitioned to pills.  Despite starting oral diltiazem and loading with digoxin, he developed intermittent periods of a-fib with RVR in the 140s-150s that would last for several hours.  Cardiology was consulted.  His diltiazem and digoxin were discontinued and his metoprolol was increased.  His HR improved and he has been mildly bradycardic but asymptomatic.  Suspect that his bradycardia will improve somewhat with time as the diltiazem and digoxin are cleared.   CHADS-VASC score of 5.  Xarelto for anticoagulation.    Recent history of GI bleed.  His daughter reporting that he had a large bloody bowel movement about a month ago for which he never sought medical attention.  He did not have evidence of GI bleed during his hospitalization.  Follow-up with GI in the outpatient setting.  - Repeat CBC in 1 week postdischarge to follow-up anemia  Peripheral edema, held acetazolamide in acute setting, but may resume now that he is eating well.    HTN (hypertension), blood pressure low during periods of a-fib but  currently improved.  Acute urinary retention.  Foley placed after I/O cath x 3.  Myrbretriq was discontinued.  Continued flomax.  Discharged with foley catheter and follow up with [facility] in 1-2 weeks.    Stage 2 sacral  decubitus ulcer, not present at the time of admission.  Allevyn dressing and frequent out of bed and turning if needed.     Discharge Instructions  Discharge Instructions    Call MD / Call 911    Complete by:  As directed   If you experience chest pain or shortness of breath, CALL 911 and be transported to the hospital emergency room.  If you develope a fever above 101 F, pus (white drainage) or increased drainage or redness at the wound, or calf pain, call your surgeon's office.   Call MD for:  difficulty breathing, headache or visual disturbances    Complete by:  As directed   Call MD for:  extreme fatigue    Complete by:  As directed   Call MD for:  hives    Complete by:  As directed   Call MD for:  persistant dizziness or light-headedness    Complete by:  As directed   Call MD for:  persistant nausea and vomiting    Complete by:  As directed   Call MD for:  redness, tenderness, or signs of infection (pain, swelling, redness, odor or green/yellow discharge around incision site)    Complete by:  As directed   Call MD for:  severe uncontrolled pain    Complete by:  As directed   Call MD for:  temperature >100.4    Complete by:  As directed   Constipation Prevention    Complete by:  As directed   Drink plenty of fluids.  Prune juice may be helpful.  You may use a stool softener, such as Colace (over the counter) 100 mg twice a day.  Use MiraLax (over the counter) for constipation as needed.   Diet - low sodium heart healthy    Complete by:  As directed   Diet - low sodium heart healthy    Complete by:  As directed   Discharge instructions    Complete by:  As directed   INSTRUCTIONS AFTER JOINT REPLACEMENT   Remove items at home which could result in a fall. This includes throw rugs or furniture in walking pathways ICE to the affected joint every three hours while awake for 30 minutes at a time, for at least the first 3-5 days, and then as needed for pain and swelling.  Continue to use ice  for pain and swelling. You may notice swelling that will progress down to the foot and ankle.  This is normal after surgery.  Elevate your leg when you are not up walking on it.   Continue to use the breathing machine you got in the hospital (incentive spirometer) which will help keep your temperature down.  It is common for your temperature to cycle up and down following surgery, especially at night when you are not up moving around and exerting yourself.  The breathing machine keeps your lungs expanded and your temperature down.   DIET:  As you were doing prior to hospitalization, we recommend a well-balanced diet.  DRESSING / WOUND CARE / SHOWERING  Keep the surgical dressing until follow up.  The dressing is water proof, so you can shower without any extra covering.  IF THE DRESSING FALLS OFF or the wound gets wet inside, change  the dressing with sterile gauze.  Please use good hand washing techniques before changing the dressing.  Do not use any lotions or creams on the incision until instructed by your surgeon.    ACTIVITY  Increase activity slowly as tolerated, but follow the weight bearing instructions below.   No driving for 6 weeks or until further direction given by your physician.  You cannot drive while taking narcotics.  No lifting or carrying greater than 10 lbs. until further directed by your surgeon. Avoid periods of inactivity such as sitting longer than an hour when not asleep. This helps prevent blood clots.  You may return to work once you are authorized by your doctor.     WEIGHT BEARING   Weight bearing as tolerated with assist device (walker, cane, etc) as directed, use it as long as suggested by your surgeon or therapist, typically at least 4-6 weeks.   EXERCISES  Results after joint replacement surgery are often greatly improved when you follow the exercise, range of motion and muscle strengthening exercises prescribed by your doctor. Safety measures are also  important to protect the joint from further injury. Any time any of these exercises cause you to have increased pain or swelling, decrease what you are doing until you are comfortable again and then slowly increase them. If you have problems or questions, call your caregiver or physical therapist for advice.   Rehabilitation is important following a joint replacement. After just a few days of immobilization, the muscles of the leg can become weakened and shrink (atrophy).  These exercises are designed to build up the tone and strength of the thigh and leg muscles and to improve motion. Often times heat used for twenty to thirty minutes before working out will loosen up your tissues and help with improving the range of motion but do not use heat for the first two weeks following surgery (sometimes heat can increase post-operative swelling).   These exercises can be done on a training (exercise) mat, on the floor, on a table or on a bed. Use whatever works the best and is most comfortable for you.    Use music or television while you are exercising so that the exercises are a pleasant break in your day. This will make your life better with the exercises acting as a break in your routine that you can look forward to.   Perform all exercises about fifteen times, three times per day or as directed.  You should exercise both the operative leg and the other leg as well.   Exercises include:   Quad Sets - Tighten up the muscle on the front of the thigh (Quad) and hold for 5-10 seconds.   Straight Leg Raises - With your knee straight (if you were given a brace, keep it on), lift the leg to 60 degrees, hold for 3 seconds, and slowly lower the leg.  Perform this exercise against resistance later as your leg gets stronger.  Leg Slides: Lying on your back, slowly slide your foot toward your buttocks, bending your knee up off the floor (only go as far as is comfortable). Then slowly slide your foot back down until your  leg is flat on the floor again.  Angel Wings: Lying on your back spread your legs to the side as far apart as you can without causing discomfort.  Hamstring Strength:  Lying on your back, push your heel against the floor with your leg straight by tightening up the muscles of your  buttocks.  Repeat, but this time bend your knee to a comfortable angle, and push your heel against the floor.  You may put a pillow under the heel to make it more comfortable if necessary.   A rehabilitation program following joint replacement surgery can speed recovery and prevent re-injury in the future due to weakened muscles. Contact your doctor or a physical therapist for more information on knee rehabilitation.    CONSTIPATION  Constipation is defined medically as fewer than three stools per week and severe constipation as less than one stool per week.  Even if you have a regular bowel pattern at home, your normal regimen is likely to be disrupted due to multiple reasons following surgery.  Combination of anesthesia, postoperative narcotics, change in appetite and fluid intake all can affect your bowels.   YOU MUST use at least one of the following options; they are listed in order of increasing strength to get the job done.  They are all available over the counter, and you may need to use some, POSSIBLY even all of these options:    Drink plenty of fluids (prune juice may be helpful) and high fiber foods Colace 100 mg by mouth twice a day  Senokot for constipation as directed and as needed Dulcolax (bisacodyl), take with full glass of water  Miralax (polyethylene glycol) once or twice a day as needed.  If you have tried all these things and are unable to have a bowel movement in the first 3-4 days after surgery call either your surgeon or your primary doctor.    If you experience loose stools or diarrhea, hold the medications until you stool forms back up.  If your symptoms do not get better within 1 week or if  they get worse, check with your doctor.  If you experience "the worst abdominal pain ever" or develop nausea or vomiting, please contact the office immediately for further recommendations for treatment.   ITCHING:  If you experience itching with your medications, try taking only a single pain pill, or even half a pain pill at a time.  You can also use Benadryl over the counter for itching or also to help with sleep.   TED HOSE STOCKINGS:  Use stockings on both legs until for at least 2 weeks or as directed by physician office. They may be removed at night for sleeping.  MEDICATIONS:  See your medication summary on the "After Visit Summary" that nursing will review with you.  You may have some home medications which will be placed on hold until you complete the course of blood thinner medication.  It is important for you to complete the blood thinner medication as prescribed.  PRECAUTIONS:  If you experience chest pain or shortness of breath - call 911 immediately for transfer to the hospital emergency department.   If you develop a fever greater that 101 F, purulent drainage from wound, increased redness or drainage from wound, foul odor from the wound/dressing, or calf pain - CONTACT YOUR SURGEON.                                                   FOLLOW-UP APPOINTMENTS:  If you do not already have a post-op appointment, please call the office for an appointment to be seen by your surgeon.  Guidelines for how soon to be seen  are listed in your "After Visit Summary", but are typically between 1-4 weeks after surgery.  OTHER INSTRUCTIONS:   Knee Replacement:  Do not place pillow under knee, focus on keeping the knee straight while resting. CPM instructions: 0-90 degrees, 2 hours in the morning, 2 hours in the afternoon, and 2 hours in the evening. Place foam block, curve side up under heel at all times except when in CPM or when walking.  DO NOT modify, tear, cut, or change the foam block in any  way.  MAKE SURE YOU:  Understand these instructions.  Get help right away if you are not doing well or get worse.    Thank you for letting us be a part of your medical care team.  It is a privilege we respect greatly.  We hope these instructions will help you stay on track for a fast and full recovery!   Full weight bearing    Complete by:  As directed   Increase activity slowly    Complete by:  As directed   Increase activity slowly as tolerated    Complete by:  As directed   Leave dressing on - Keep it clean, dry, and intact until clinic visit    Complete by:  As directed       Medication List    STOP taking these medications   BENADRYL MAXIMUM STRENGTH 2 % cream Generic drug:  diphenhydrAMINE   metoprolol succinate 25 MG 24 hr tablet Commonly known as:  TOPROL-XL   MYRBETRIQ 25 MG Tb24 tablet Generic drug:  mirabegron ER   NAPROSYN PO     TAKE these medications   acetaZOLAMIDE 250 MG tablet Commonly known as:  DIAMOX ALTERNATE 2 TABLETS BY MOUTH EVERY DAY WITH 1 TABLET BY MOUTH EVERY DAY.   citalopram 20 MG tablet Commonly known as:  CELEXA Take 20 mg by mouth daily.   ferrous sulfate 325 (65 FE) MG tablet Take 325 mg by mouth 2 (two) times daily.   fluticasone 50 MCG/ACT nasal spray Commonly known as:  FLONASE INSTILL 2 SPRAYS INTO EACH NOSTRIL DAILY   HYDROcodone-acetaminophen 5-325 MG tablet Commonly known as:  NORCO/VICODIN Take 1 tablet by mouth every 6 (six) hours as needed for moderate pain or severe pain.   metoprolol tartrate 25 MG tablet Commonly known as:  LOPRESSOR Take 1 tablet (25 mg total) by mouth 2 (two) times daily.   pantoprazole 40 MG tablet Commonly known as:  PROTONIX Take 1 tablet (40 mg total) by mouth daily.   polycarbophil 625 MG tablet Commonly known as:  FIBERCON Take 625 mg by mouth daily.   Potassium Gluconate 550 MG Tabs Take 1 tablet by mouth daily.   rivaroxaban 20 MG Tabs tablet Commonly known as:  XARELTO Take 1  tablet (20 mg total) by mouth daily with breakfast.   simvastatin 80 MG tablet Commonly known as:  ZOCOR TAKE 1 TABLET BY MOUTH AT BEDTIME   tamsulosin 0.4 MG Caps capsule Commonly known as:  FLOMAX Take 1 capsule (0.4 mg total) by mouth daily.   triamcinolone 0.025 % cream Commonly known as:  KENALOG Apply 2-3 times daily to skin eruptions, hives      Follow-up Information    Ginette Otto, MD Follow up in 1 month(s).   Specialty:  Internal Medicine Contact information: [address] [place] [address] [phone]        [facility] Follow up in 2 week(s).   Contact information: [address] [place] [address] [phone]       [facility] [address] Office Follow up in 1 month(s).   Specialty:  Cardiology Contact information: [address], [address] [place] [address] [phone]       Cammy Copa, MD Follow up in 2 week(s).   Specialty:  Orthopedic Surgery Contact information: [address] [place] [address] [phone]        [facility] Follow up in 1 month(s).   Specialty:  Gastroenterology Contact information: [address] [place] [address] [phone]         Allergies  Allergen Reactions  . Grapefruit Concentrate Other (See Comments)    Cannot take with several of his medications  . Amoxicillin Rash  . Penicillins Rash    Has patient had a PCN reaction causing immediate rash, facial/tongue/throat swelling, SOB or lightheadedness with hypotension: Yes Has patient had a PCN reaction causing severe rash involving mucus membranes or skin necrosis: Unknown Has patient had a PCN reaction that required hospitalization: Unknown Has patient had a PCN reaction occurring within the last 10 years: No If all of the above answers are "NO", then may proceed with Cephalosporin use.      Consultations: Cardiology Orthopedic surgery, Dr. August Saucer   Procedures/Studies: Dg Elbow Complete Right (3+view)  Result Date: 08/23/2015 CLINICAL DATA:  Fall today. Right hip fracture. Abrasion to the right elbow. EXAM: RIGHT ELBOW - COMPLETE 3+ VIEW COMPARISON:  None. FINDINGS: Right elbow is located. There is no significant effusion. No acute fracture is present. Soft tissue swelling is present posterior to the olecranon. IMPRESSION: Soft tissue swelling posterior to the elbow without underlying fracture. Electronically Signed   By: Marin Roberts M.D.   On: 08/23/2015 19:14   Chest Portable 1 View  Result Date: 08/23/2015 CLINICAL DATA:  Fall EXAM: PORTABLE CHEST 1 VIEW COMPARISON:  05/25/2011 FINDINGS: Cardiomediastinal silhouette is stable. No acute infiltrate or pleural effusion. No pulmonary edema. Stable left basilar scarring. IMPRESSION: No active disease. Electronically Signed   By: Natasha Mead M.D.   On: 08/23/2015 22:13   Dg C-arm 61-120 Min  Result Date: 08/24/2015 CLINICAL DATA:  Right hip arthroplasty EXAM: OPERATIVE RIGHT HIP WITH PELVIS; DG C-ARM 61-120 MIN COMPARISON:  None. FLUOROSCOPY TIME:  Radiation Exposure Index (as provided by the fluoroscopic device): Not available If the device does not provide the exposure index: Fluoroscopy Time:  18 seconds Number of Acquired Images:  2 FINDINGS: Right hip arthroplasty is noted in satisfactory position. No acute soft tissue or bony abnormality is noted. IMPRESSION: Status post right hip replacement Electronically Signed   By: Alcide Clever M.D.   On: 08/24/2015 16:55   Dg Hip Port Unilat With Pelvis 1v Right  Result Date: 08/24/2015 CLINICAL DATA:  hip fracture, postop EXAM: DG HIP (WITH OR WITHOUT PELVIS) 1V PORT RIGHT COMPARISON:  Intraoperative film same day FINDINGS: Two views of the right hip submitted included frontal view of the pelvis. There is right hip prosthesis with anatomic alignment. Lateral skin staples are  noted. Postsurgical changes with periarticular soft tissue air. IMPRESSION: Right hip prosthesis with anatomic alignment. Electronically Signed   By: Natasha Mead M.D.   On: 08/24/2015 17:26   Dg Hip Operative Unilat W Or W/o Pelvis Right  Result Date: 08/24/2015 CLINICAL DATA:  Right hip arthroplasty EXAM: OPERATIVE RIGHT HIP WITH PELVIS; DG C-ARM 61-120 MIN COMPARISON:  None. FLUOROSCOPY TIME:  Radiation Exposure Index (  as provided by the fluoroscopic device): Not available If the device does not provide the exposure index: Fluoroscopy Time:  18 seconds Number of Acquired Images:  2 FINDINGS: Right hip arthroplasty is noted in satisfactory position. No acute soft tissue or bony abnormality is noted. IMPRESSION: Status post right hip replacement Electronically Signed   By: Alcide Clever M.D.   On: 08/24/2015 16:55   Dg Hip Unilat With Pelvis 2-3 Views Right  Result Date: 08/23/2015 CLINICAL DATA:  Fall this afternoon while taking out the trash. Acute onset of right hip pain. EXAM: DG HIP (WITH OR WITHOUT PELVIS) 2-3V RIGHT COMPARISON:  None. FINDINGS: A right subcapital femoral neck fracture is present. Increased varus angulation and slight foreshortening is present. The femoral head is located. The pelvis is otherwise intact. Degenerative changes are present in the lower lumbar spine. IMPRESSION: 1. Right subcapital femoral neck fracture with increased angulation. Electronically Signed   By: Marin Roberts M.D.   On: 08/23/2015 19:13     Subjective: Feels less anxious and more relaxed today.  Mild right leg pain, but otherwise no discomfort.  Breathing comfortably.    Discharge Exam: Vitals:   08/30/15 0526 08/30/15 1030  BP: 121/65 122/61  Pulse: 72 73  Resp: 15 20  Temp: 97.7 F (36.5 C) 97.9 F (36.6 C)   Vitals:   08/29/15 1635 08/29/15 2050 08/30/15 0526 08/30/15 1030  BP: 118/67 129/68 121/65 122/61  Pulse: 77 73 72 73  Resp:  15 15 20   Temp:  98.4 F (36.9 C) 97.7 F (36.5  C) 97.9 F (36.6 C)  TempSrc:  Oral Oral Oral  SpO2:  97% 92% 95%  Weight:      Height:        General exam:  Adult male.  No acute distress.  HEENT:  NCAT, MMM Respiratory system: Clear to auscultation bilaterally Cardiovascular system:  IRRR and tachycardic, normal S1/S2. No murmurs, rubs, gallops or clicks.  Warm extremities Gastrointestinal system: Normal active bowel sounds, soft, nondistended, nontender. MSK:  Normal tone and bulk, no lower extremity edema.  Right anterior hip bandage with trace serosanguinous, mild swelling but no palpable hematoma Neuro:  Grossly intact. Sensation on right foot intact, able to wiggle toes on right foot.    The results of significant diagnostics from this hospitalization (including imaging, microbiology, ancillary and laboratory) are listed below for reference.     Microbiology: Recent Results (from the past 240 hour(s))  Surgical pcr screen     Status: Abnormal   Collection Time: 08/24/15  5:33 AM  Result Value Ref Range Status   MRSA, PCR NEGATIVE NEGATIVE Final   Staphylococcus aureus POSITIVE (A) NEGATIVE Final    Comment:        The Xpert SA Assay (FDA approved for NASAL specimens in patients over 45 years of age), is one component of a comprehensive surveillance program.  Test performance has been validated by [facility] for patients greater than or equal to 37 year old. It is not intended to diagnose infection nor to guide or monitor treatment.      Labs: BNP (last 3 results)  Recent Labs  08/23/15 2243  BNP 96.0   Basic Metabolic Panel:  Recent Labs Lab 08/23/15 2013 08/24/15 0410 08/25/15 0839 08/26/15 0250 08/27/15 0517  NA 139 138 136 138 139  K 4.0 4.0 4.1 3.8 3.4*  CL 112* 113* 112* 113* 108  CO2 20* 20* 20* 20* 25  GLUCOSE 116* 156* 145* 126* 110*  BUN 15 17 16 15 20   CREATININE 0.98 0.92 0.97 0.88 0.88  CALCIUM 9.1 8.8* 8.1* 8.0* 8.2*   Liver Function Tests:  Recent Labs Lab 08/23/15 2013  08/26/15 0250  AST 27 21  ALT 22 13*  ALKPHOS 76 57  BILITOT 0.4 1.5*  PROT 6.9 5.5*  ALBUMIN 4.0 2.7*   No results for input(s): LIPASE, AMYLASE in the last 168 hours. No results for input(s): AMMONIA in the last 168 hours. CBC:  Recent Labs Lab 08/23/15 2013 08/24/15 0410 08/25/15 0839 08/26/15 0250 08/27/15 0517  WBC 9.3 7.4 7.1 7.3 6.6  NEUTROABS 7.8*  --   --   --   --   HGB 14.1 12.7* 11.9* 11.2* 10.5*  HCT 44.5 40.3 37.7* 36.6* 34.1*  MCV 99.1 97.8 97.9 98.4 98.3  PLT 162 148* 116* 119* 124*   Cardiac Enzymes:  Recent Labs Lab 08/23/15 2243 08/24/15 0410 08/24/15 1019  TROPONINI <0.03 <0.03 <0.03   BNP: Invalid input(s): POCBNP CBG:  Recent Labs Lab 08/27/15 1130  GLUCAP 151*   D-Dimer No results for input(s): DDIMER in the last 72 hours. Hgb A1c No results for input(s): HGBA1C in the last 72 hours. Lipid Profile No results for input(s): CHOL, HDL, LDLCALC, TRIG, CHOLHDL, LDLDIRECT in the last 72 hours. Thyroid function studies No results for input(s): TSH, T4TOTAL, T3FREE, THYROIDAB in the last 72 hours.  Invalid input(s): FREET3 Anemia work up No results for input(s): VITAMINB12, FOLATE, FERRITIN, TIBC, IRON, RETICCTPCT in the last 72 hours. Urinalysis    Component Value Date/Time   COLORURINE YELLOW 08/24/2015 1023   APPEARANCEUR CLOUDY (A) 08/24/2015 1023   LABSPEC 1.020 08/24/2015 1023   PHURINE 7.0 08/24/2015 1023   GLUCOSEU NEGATIVE 08/24/2015 1023   HGBUR NEGATIVE 08/24/2015 1023   BILIRUBINUR NEGATIVE 08/24/2015 1023   KETONESUR NEGATIVE 08/24/2015 1023   PROTEINUR NEGATIVE 08/24/2015 1023   NITRITE NEGATIVE 08/24/2015 1023   LEUKOCYTESUR SMALL (A) 08/24/2015 1023   Sepsis Labs Invalid input(s): PROCALCITONIN,  WBC,  LACTICIDVEN   Time coordinating discharge: Over 30 minutes  SIGNED:   Renae FickleSHORT, Bellamy Judson, MD  [facility] 08/30/2015, 12:38 PM Pager   If 7PM-7AM, please contact  night-coverage [email or web address] Password [email or web address]

## 2015-08-30 NOTE — Clinical Social Work Note (Signed)
Clinical Social Work Assessment  Patient Details  Name: Jeff Hunt MRN: [id number] Date of Birth: 09/21/1925  Date of referral:  08/30/15               Reason for consult:  Facility Placement                Permission sought to share information with:   (Facilities) Permission granted to share information::   (Facilities)  Name::        Agency::     Relationship::     Contact Information:     Housing/Transportation Living arrangements for the past 2 months:  Single Family Home (Home with close friend in [place]) Source of Information:  Patient, Friend/Neighbor Patient Interpreter Needed:  None Criminal Activity/Legal Involvement Pertinent to Current Situation/Hospitalization:  No - Comment as needed Significant Relationships:  Friend (Close friend/ Ally [phone]) Lives with:  Friends Do you feel safe going back to the place where you live?   (Patient will be going to [facility] upon dischare) Need for family participation in patient care:  Yes (Comment)  Care giving concerns:  SW spoke with friend who states pt fell and broke his R hip. She states pt was independent prior to falling. However, she states now pt needs assistance with ADL's.   Social Worker assessment / plan:  SW met with patient at bedside. Patient was alert and oriented. Family friend was present who states she is like a daughter to patient, but not biologically or legally/ Ally [phone]. Patient is interested in SNF. SW referred pt out to local SNF and informed him of the facilities who offered him a bed. Patient has choosen [facility].  Employment status:  Retired Forensic scientist:   Education officer, environmental) PT Recommendations:  [place] / Referral to community resources:   (SW spoke with pt and friend regarding facilities. Patient and friend decided [facility]. )  Patient/Family's Response to care:  Patient and family are accepting at this time.  Patient/Family's  Understanding of and Emotional Response to Diagnosis, Current Treatment, and Prognosis:  Patient and family have no questions at this time.  Emotional Assessment Appearance:  Appears stated age Attitude/Demeanor/Rapport:   (Appropriate.) Affect (typically observed):  Accepting, Appropriate Orientation:  Oriented to Self, Oriented to Place, Oriented to  Time, Oriented to Situation Alcohol / Substance use:  Not Applicable Psych involvement (Current and /or in the community):  No (Comment)  Discharge Needs  Concerns to be addressed:  Adjustment to Illness Readmission within the last 30 days:  No Current discharge risk:  None Barriers to Discharge:  No Barriers Identified   Bernita Buffy 08/30/2015, 10:27 AM

## 2015-08-30 NOTE — Clinical Social Work Placement (Signed)
   CLINICAL SOCIAL WORK PLACEMENT  NOTE  Date:  08/30/2015  Patient Details  Name: [name] MRN: [id number] Date of Birth: 05/08/1925  Clinical Social Work is seeking post-discharge placement for this patient at the Skilled  Nursing Facility level of care (*CSW will initial, date and re-position this form in  chart as items are completed):  Yes   Patient/family provided with [place] Clinical Social Work Department's list of facilities offering this level of care within the geographic area requested by the patient (or if unable, by the patient's family).  Yes   Patient/family informed of their freedom to choose among providers that offer the needed level of care, that participate in Medicare, Medicaid or managed care program needed by the patient, have an available bed and are willing to accept the patient.  Yes   Patient/family informed of [facility]'s ownership interest in [facility] and [facility], as well as of the fact that they are under no obligation to receive care at these facilities.  PASRR submitted to EDS on       PASRR number received on       Existing PASRR number confirmed on       FL2 transmitted to all facilities in geographic area requested by pt/family on       FL2 transmitted to all facilities within larger geographic area on       Patient informed that his/her managed care company has contracts with or will negotiate with certain facilities, including the following:        Yes   Patient/family informed of bed offers received.  Patient chooses bed at  [facility])     Physician recommends and patient chooses bed at      Patient to be transferred to   on  .  Patient to be transferred to facility by       Patient family notified on   of transfer.  Name of family member notified:        PHYSICIAN       Additional Comment:    _______________________________________________ Alene MiresWhitaker, Jamieon Lannen R 08/30/2015, 10:35 AM

## 2015-08-30 NOTE — Progress Notes (Signed)
Physical Therapy Treatment Patient Details Name: Jeff Hunt MRN: [id number] DOB: 1925-11-26 Today's Date: 08/30/2015    History of Present Illness Patient is an 82 yo male admitted 08/23/15 after a fall with Rt displaced femoral neck fracture.  Patient s/p Rt hemiarthroplasty on 08/24/15.  New diagnosis of Afib with RVR.    PMH:  CAD, TIA, CKD, anemia, dizziness, chronic LE edema, HOH, memory loss    PT Comments    Pt making gradual progress, continue to recommend SNF for further rehabilitation. Pt able to progress to taking steps during session.   Follow Up Recommendations  SNF     Equipment Recommendations  Rolling walker with 5" wheels;3in1 (PT)    Recommendations for Other Services       Precautions / Restrictions Precautions Precautions: Fall Restrictions Weight Bearing Restrictions: Yes RLE Weight Bearing: Weight bearing as tolerated    Mobility  Bed Mobility Overal bed mobility: Needs Assistance;+2 for physical assistance Bed Mobility: Supine to Sit;Sit to Supine     Supine to sit: +2 for physical assistance;HOB elevated;Mod assist;Min assist Sit to supine: +2 for physical assistance;Max assist   General bed mobility comments: assist provided at LEs and trunk. Pt attempting to assist with UEs and move LEs to EOB  Transfers Overall transfer level: Needs assistance Equipment used: Rolling walker (2 wheeled) Transfers: Sit to/from Stand Sit to Stand: +2 physical assistance;Mod assist            Ambulation/Gait Ambulation/Gait assistance: Mod assist Ambulation Distance (Feet): 4 Feet Assistive device: Rolling walker (2 wheeled) Gait Pattern/deviations: Step-to pattern Gait velocity: very slow pattern   General Gait Details: 3 steps forward/back, 3 lateral steps    Stairs            Wheelchair Mobility    Modified Rankin (Stroke Patients Only)       Balance Overall balance assessment: Needs assistance Sitting-balance support: No upper  extremity supported Sitting balance-Leahy Scale: Fair     Standing balance support: Bilateral upper extremity supported Standing balance-Leahy Scale: Poor Standing balance comment: using rw for assist and physical assist                    Cognition Arousal/Alertness: Awake/alert Behavior During Therapy: WFL for tasks assessed/performed Overall Cognitive Status: Within Functional Limits for tasks assessed                      Exercises      General Comments        Pertinent Vitals/Pain Pain Assessment: Faces Faces Pain Scale: Hurts little more Pain Location: Rt hip Pain Descriptors / Indicators: Grimacing;Guarding Pain Intervention(s): Limited activity within patient's tolerance;Monitored during session    Home Living                      Prior Function            PT Goals (current goals can now be found in the care plan section) Acute Rehab PT Goals Patient Stated Goal: move a little PT Goal Formulation: With patient/family Time For Goal Achievement: 09/08/15 Potential to Achieve Goals: Good Progress towards PT goals: Progressing toward goals    Frequency  Min 3X/week    PT Plan Current plan remains appropriate    Co-evaluation             End of Session Equipment Utilized During Treatment: Gait belt Activity Tolerance: Patient limited by fatigue Patient left: in bed;with call bell/phone  within reach;with family/visitor present     Time: [id number] PT Time Calculation (min) (ACUTE ONLY): 15 min  Charges:  $Therapeutic Activity: 8-22 mins                    G Codes:      Christiane HaBenjamin J. Deunte Bledsoe, PT, CSCS Pager [phone] Office [phone]  08/30/2015, 4:52 PM

## 2015-08-30 NOTE — Care Management Important Message (Signed)
Important Message  Patient Details  Name: Jeff Hunt MRN: [id number] Date of Birth: 08/04/1925   Medicare Important Message Given:  Yes    [name] 08/30/2015, 11:05 AM

## 2015-09-03 DIAGNOSIS — M6281 Muscle weakness (generalized): Secondary | ICD-10-CM | POA: Diagnosis not present

## 2015-09-03 DIAGNOSIS — I10 Essential (primary) hypertension: Secondary | ICD-10-CM | POA: Diagnosis not present

## 2015-09-03 DIAGNOSIS — I959 Hypotension, unspecified: Secondary | ICD-10-CM | POA: Diagnosis not present

## 2015-09-12 DIAGNOSIS — M25551 Pain in right hip: Secondary | ICD-10-CM | POA: Diagnosis not present

## 2015-09-12 DIAGNOSIS — M6281 Muscle weakness (generalized): Secondary | ICD-10-CM | POA: Diagnosis not present

## 2015-09-12 DIAGNOSIS — R5381 Other malaise: Secondary | ICD-10-CM | POA: Diagnosis not present

## 2015-09-12 DIAGNOSIS — R262 Difficulty in walking, not elsewhere classified: Secondary | ICD-10-CM | POA: Diagnosis not present

## 2015-09-20 DIAGNOSIS — H918X3 Other specified hearing loss, bilateral: Secondary | ICD-10-CM | POA: Diagnosis not present

## 2015-09-20 DIAGNOSIS — S72011D Unspecified intracapsular fracture of right femur, subsequent encounter for closed fracture with routine healing: Secondary | ICD-10-CM | POA: Diagnosis not present

## 2015-09-20 DIAGNOSIS — Z9181 History of falling: Secondary | ICD-10-CM | POA: Diagnosis not present

## 2015-09-20 DIAGNOSIS — M1991 Primary osteoarthritis, unspecified site: Secondary | ICD-10-CM | POA: Diagnosis not present

## 2015-09-20 DIAGNOSIS — Z7901 Long term (current) use of anticoagulants: Secondary | ICD-10-CM | POA: Diagnosis not present

## 2015-09-20 DIAGNOSIS — I129 Hypertensive chronic kidney disease with stage 1 through stage 4 chronic kidney disease, or unspecified chronic kidney disease: Secondary | ICD-10-CM | POA: Diagnosis not present

## 2015-09-20 DIAGNOSIS — I4891 Unspecified atrial fibrillation: Secondary | ICD-10-CM | POA: Diagnosis not present

## 2015-09-20 DIAGNOSIS — N182 Chronic kidney disease, stage 2 (mild): Secondary | ICD-10-CM | POA: Diagnosis not present

## 2015-09-20 DIAGNOSIS — D509 Iron deficiency anemia, unspecified: Secondary | ICD-10-CM | POA: Diagnosis not present

## 2015-09-20 DIAGNOSIS — W19XXXD Unspecified fall, subsequent encounter: Secondary | ICD-10-CM | POA: Diagnosis not present

## 2015-09-20 DIAGNOSIS — Z4801 Encounter for change or removal of surgical wound dressing: Secondary | ICD-10-CM | POA: Diagnosis not present

## 2015-09-20 DIAGNOSIS — Z79891 Long term (current) use of opiate analgesic: Secondary | ICD-10-CM | POA: Diagnosis not present

## 2015-09-30 DIAGNOSIS — D509 Iron deficiency anemia, unspecified: Secondary | ICD-10-CM | POA: Diagnosis not present

## 2015-09-30 DIAGNOSIS — W19XXXD Unspecified fall, subsequent encounter: Secondary | ICD-10-CM | POA: Diagnosis not present

## 2015-09-30 DIAGNOSIS — Z4801 Encounter for change or removal of surgical wound dressing: Secondary | ICD-10-CM | POA: Diagnosis not present

## 2015-09-30 DIAGNOSIS — Z7901 Long term (current) use of anticoagulants: Secondary | ICD-10-CM | POA: Diagnosis not present

## 2015-09-30 DIAGNOSIS — M1991 Primary osteoarthritis, unspecified site: Secondary | ICD-10-CM | POA: Diagnosis not present

## 2015-09-30 DIAGNOSIS — H918X3 Other specified hearing loss, bilateral: Secondary | ICD-10-CM | POA: Diagnosis not present

## 2015-09-30 DIAGNOSIS — Z9181 History of falling: Secondary | ICD-10-CM | POA: Diagnosis not present

## 2015-09-30 DIAGNOSIS — N182 Chronic kidney disease, stage 2 (mild): Secondary | ICD-10-CM | POA: Diagnosis not present

## 2015-09-30 DIAGNOSIS — Z79891 Long term (current) use of opiate analgesic: Secondary | ICD-10-CM | POA: Diagnosis not present

## 2015-09-30 DIAGNOSIS — I4891 Unspecified atrial fibrillation: Secondary | ICD-10-CM | POA: Diagnosis not present

## 2015-09-30 DIAGNOSIS — S72011D Unspecified intracapsular fracture of right femur, subsequent encounter for closed fracture with routine healing: Secondary | ICD-10-CM | POA: Diagnosis not present

## 2015-09-30 DIAGNOSIS — I129 Hypertensive chronic kidney disease with stage 1 through stage 4 chronic kidney disease, or unspecified chronic kidney disease: Secondary | ICD-10-CM | POA: Diagnosis not present

## 2015-09-30 DIAGNOSIS — S72001D Fracture of unspecified part of neck of right femur, subsequent encounter for closed fracture with routine healing: Secondary | ICD-10-CM | POA: Diagnosis not present

## 2015-10-01 DIAGNOSIS — D649 Anemia, unspecified: Secondary | ICD-10-CM | POA: Diagnosis not present

## 2015-10-01 DIAGNOSIS — Z23 Encounter for immunization: Secondary | ICD-10-CM | POA: Diagnosis not present

## 2015-10-01 DIAGNOSIS — Z79899 Other long term (current) drug therapy: Secondary | ICD-10-CM | POA: Diagnosis not present

## 2015-10-01 DIAGNOSIS — R000 Tachycardia, unspecified: Secondary | ICD-10-CM | POA: Diagnosis not present

## 2015-10-01 DIAGNOSIS — R1313 Dysphagia, pharyngeal phase: Secondary | ICD-10-CM | POA: Diagnosis not present

## 2015-10-01 DIAGNOSIS — I480 Paroxysmal atrial fibrillation: Secondary | ICD-10-CM | POA: Diagnosis not present

## 2015-10-02 DIAGNOSIS — D509 Iron deficiency anemia, unspecified: Secondary | ICD-10-CM | POA: Diagnosis not present

## 2015-10-02 DIAGNOSIS — N182 Chronic kidney disease, stage 2 (mild): Secondary | ICD-10-CM | POA: Diagnosis not present

## 2015-10-02 DIAGNOSIS — S72011D Unspecified intracapsular fracture of right femur, subsequent encounter for closed fracture with routine healing: Secondary | ICD-10-CM | POA: Diagnosis not present

## 2015-10-02 DIAGNOSIS — Z9181 History of falling: Secondary | ICD-10-CM | POA: Diagnosis not present

## 2015-10-02 DIAGNOSIS — I129 Hypertensive chronic kidney disease with stage 1 through stage 4 chronic kidney disease, or unspecified chronic kidney disease: Secondary | ICD-10-CM | POA: Diagnosis not present

## 2015-10-02 DIAGNOSIS — M1991 Primary osteoarthritis, unspecified site: Secondary | ICD-10-CM | POA: Diagnosis not present

## 2015-10-02 DIAGNOSIS — Z79891 Long term (current) use of opiate analgesic: Secondary | ICD-10-CM | POA: Diagnosis not present

## 2015-10-02 DIAGNOSIS — Z7901 Long term (current) use of anticoagulants: Secondary | ICD-10-CM | POA: Diagnosis not present

## 2015-10-02 DIAGNOSIS — I4891 Unspecified atrial fibrillation: Secondary | ICD-10-CM | POA: Diagnosis not present

## 2015-10-02 DIAGNOSIS — W19XXXD Unspecified fall, subsequent encounter: Secondary | ICD-10-CM | POA: Diagnosis not present

## 2015-10-02 DIAGNOSIS — H918X3 Other specified hearing loss, bilateral: Secondary | ICD-10-CM | POA: Diagnosis not present

## 2015-10-02 DIAGNOSIS — Z4801 Encounter for change or removal of surgical wound dressing: Secondary | ICD-10-CM | POA: Diagnosis not present

## 2015-10-04 DIAGNOSIS — D509 Iron deficiency anemia, unspecified: Secondary | ICD-10-CM | POA: Diagnosis not present

## 2015-10-04 DIAGNOSIS — Z7901 Long term (current) use of anticoagulants: Secondary | ICD-10-CM | POA: Diagnosis not present

## 2015-10-04 DIAGNOSIS — M1991 Primary osteoarthritis, unspecified site: Secondary | ICD-10-CM | POA: Diagnosis not present

## 2015-10-04 DIAGNOSIS — N182 Chronic kidney disease, stage 2 (mild): Secondary | ICD-10-CM | POA: Diagnosis not present

## 2015-10-04 DIAGNOSIS — W19XXXD Unspecified fall, subsequent encounter: Secondary | ICD-10-CM | POA: Diagnosis not present

## 2015-10-04 DIAGNOSIS — S72011D Unspecified intracapsular fracture of right femur, subsequent encounter for closed fracture with routine healing: Secondary | ICD-10-CM | POA: Diagnosis not present

## 2015-10-04 DIAGNOSIS — Z79891 Long term (current) use of opiate analgesic: Secondary | ICD-10-CM | POA: Diagnosis not present

## 2015-10-04 DIAGNOSIS — I129 Hypertensive chronic kidney disease with stage 1 through stage 4 chronic kidney disease, or unspecified chronic kidney disease: Secondary | ICD-10-CM | POA: Diagnosis not present

## 2015-10-04 DIAGNOSIS — H918X3 Other specified hearing loss, bilateral: Secondary | ICD-10-CM | POA: Diagnosis not present

## 2015-10-04 DIAGNOSIS — Z9181 History of falling: Secondary | ICD-10-CM | POA: Diagnosis not present

## 2015-10-04 DIAGNOSIS — Z4801 Encounter for change or removal of surgical wound dressing: Secondary | ICD-10-CM | POA: Diagnosis not present

## 2015-10-04 DIAGNOSIS — I4891 Unspecified atrial fibrillation: Secondary | ICD-10-CM | POA: Diagnosis not present

## 2015-10-07 DIAGNOSIS — N182 Chronic kidney disease, stage 2 (mild): Secondary | ICD-10-CM | POA: Diagnosis not present

## 2015-10-07 DIAGNOSIS — I129 Hypertensive chronic kidney disease with stage 1 through stage 4 chronic kidney disease, or unspecified chronic kidney disease: Secondary | ICD-10-CM | POA: Diagnosis not present

## 2015-10-07 DIAGNOSIS — Z4801 Encounter for change or removal of surgical wound dressing: Secondary | ICD-10-CM | POA: Diagnosis not present

## 2015-10-07 DIAGNOSIS — Z7901 Long term (current) use of anticoagulants: Secondary | ICD-10-CM | POA: Diagnosis not present

## 2015-10-07 DIAGNOSIS — Z9181 History of falling: Secondary | ICD-10-CM | POA: Diagnosis not present

## 2015-10-07 DIAGNOSIS — W19XXXD Unspecified fall, subsequent encounter: Secondary | ICD-10-CM | POA: Diagnosis not present

## 2015-10-07 DIAGNOSIS — D509 Iron deficiency anemia, unspecified: Secondary | ICD-10-CM | POA: Diagnosis not present

## 2015-10-07 DIAGNOSIS — H918X3 Other specified hearing loss, bilateral: Secondary | ICD-10-CM | POA: Diagnosis not present

## 2015-10-07 DIAGNOSIS — I4891 Unspecified atrial fibrillation: Secondary | ICD-10-CM | POA: Diagnosis not present

## 2015-10-07 DIAGNOSIS — S72011D Unspecified intracapsular fracture of right femur, subsequent encounter for closed fracture with routine healing: Secondary | ICD-10-CM | POA: Diagnosis not present

## 2015-10-07 DIAGNOSIS — M1991 Primary osteoarthritis, unspecified site: Secondary | ICD-10-CM | POA: Diagnosis not present

## 2015-10-07 DIAGNOSIS — Z79891 Long term (current) use of opiate analgesic: Secondary | ICD-10-CM | POA: Diagnosis not present

## 2015-10-09 DIAGNOSIS — I129 Hypertensive chronic kidney disease with stage 1 through stage 4 chronic kidney disease, or unspecified chronic kidney disease: Secondary | ICD-10-CM | POA: Diagnosis not present

## 2015-10-09 DIAGNOSIS — H918X3 Other specified hearing loss, bilateral: Secondary | ICD-10-CM | POA: Diagnosis not present

## 2015-10-09 DIAGNOSIS — Z7901 Long term (current) use of anticoagulants: Secondary | ICD-10-CM | POA: Diagnosis not present

## 2015-10-09 DIAGNOSIS — W19XXXD Unspecified fall, subsequent encounter: Secondary | ICD-10-CM | POA: Diagnosis not present

## 2015-10-09 DIAGNOSIS — Z79891 Long term (current) use of opiate analgesic: Secondary | ICD-10-CM | POA: Diagnosis not present

## 2015-10-09 DIAGNOSIS — Z9181 History of falling: Secondary | ICD-10-CM | POA: Diagnosis not present

## 2015-10-09 DIAGNOSIS — M1991 Primary osteoarthritis, unspecified site: Secondary | ICD-10-CM | POA: Diagnosis not present

## 2015-10-09 DIAGNOSIS — I4891 Unspecified atrial fibrillation: Secondary | ICD-10-CM | POA: Diagnosis not present

## 2015-10-09 DIAGNOSIS — S72011D Unspecified intracapsular fracture of right femur, subsequent encounter for closed fracture with routine healing: Secondary | ICD-10-CM | POA: Diagnosis not present

## 2015-10-09 DIAGNOSIS — N182 Chronic kidney disease, stage 2 (mild): Secondary | ICD-10-CM | POA: Diagnosis not present

## 2015-10-09 DIAGNOSIS — Z4801 Encounter for change or removal of surgical wound dressing: Secondary | ICD-10-CM | POA: Diagnosis not present

## 2015-10-09 DIAGNOSIS — D509 Iron deficiency anemia, unspecified: Secondary | ICD-10-CM | POA: Diagnosis not present

## 2015-10-14 DIAGNOSIS — Z7901 Long term (current) use of anticoagulants: Secondary | ICD-10-CM | POA: Diagnosis not present

## 2015-10-14 DIAGNOSIS — Z9181 History of falling: Secondary | ICD-10-CM | POA: Diagnosis not present

## 2015-10-14 DIAGNOSIS — N182 Chronic kidney disease, stage 2 (mild): Secondary | ICD-10-CM | POA: Diagnosis not present

## 2015-10-14 DIAGNOSIS — M1991 Primary osteoarthritis, unspecified site: Secondary | ICD-10-CM | POA: Diagnosis not present

## 2015-10-14 DIAGNOSIS — W19XXXD Unspecified fall, subsequent encounter: Secondary | ICD-10-CM | POA: Diagnosis not present

## 2015-10-14 DIAGNOSIS — S72011D Unspecified intracapsular fracture of right femur, subsequent encounter for closed fracture with routine healing: Secondary | ICD-10-CM | POA: Diagnosis not present

## 2015-10-14 DIAGNOSIS — I129 Hypertensive chronic kidney disease with stage 1 through stage 4 chronic kidney disease, or unspecified chronic kidney disease: Secondary | ICD-10-CM | POA: Diagnosis not present

## 2015-10-14 DIAGNOSIS — Z79891 Long term (current) use of opiate analgesic: Secondary | ICD-10-CM | POA: Diagnosis not present

## 2015-10-14 DIAGNOSIS — Z4801 Encounter for change or removal of surgical wound dressing: Secondary | ICD-10-CM | POA: Diagnosis not present

## 2015-10-14 DIAGNOSIS — I4891 Unspecified atrial fibrillation: Secondary | ICD-10-CM | POA: Diagnosis not present

## 2015-10-14 DIAGNOSIS — D509 Iron deficiency anemia, unspecified: Secondary | ICD-10-CM | POA: Diagnosis not present

## 2015-10-14 DIAGNOSIS — H918X3 Other specified hearing loss, bilateral: Secondary | ICD-10-CM | POA: Diagnosis not present

## 2015-10-15 DIAGNOSIS — Z4801 Encounter for change or removal of surgical wound dressing: Secondary | ICD-10-CM | POA: Diagnosis not present

## 2015-10-15 DIAGNOSIS — N182 Chronic kidney disease, stage 2 (mild): Secondary | ICD-10-CM | POA: Diagnosis not present

## 2015-10-15 DIAGNOSIS — M1991 Primary osteoarthritis, unspecified site: Secondary | ICD-10-CM | POA: Diagnosis not present

## 2015-10-15 DIAGNOSIS — D509 Iron deficiency anemia, unspecified: Secondary | ICD-10-CM | POA: Diagnosis not present

## 2015-10-15 DIAGNOSIS — I4891 Unspecified atrial fibrillation: Secondary | ICD-10-CM | POA: Diagnosis not present

## 2015-10-15 DIAGNOSIS — I129 Hypertensive chronic kidney disease with stage 1 through stage 4 chronic kidney disease, or unspecified chronic kidney disease: Secondary | ICD-10-CM | POA: Diagnosis not present

## 2015-10-15 DIAGNOSIS — Z7901 Long term (current) use of anticoagulants: Secondary | ICD-10-CM | POA: Diagnosis not present

## 2015-10-15 DIAGNOSIS — Z9181 History of falling: Secondary | ICD-10-CM | POA: Diagnosis not present

## 2015-10-15 DIAGNOSIS — S72011D Unspecified intracapsular fracture of right femur, subsequent encounter for closed fracture with routine healing: Secondary | ICD-10-CM | POA: Diagnosis not present

## 2015-10-15 DIAGNOSIS — W19XXXD Unspecified fall, subsequent encounter: Secondary | ICD-10-CM | POA: Diagnosis not present

## 2015-10-15 DIAGNOSIS — Z79891 Long term (current) use of opiate analgesic: Secondary | ICD-10-CM | POA: Diagnosis not present

## 2015-10-15 DIAGNOSIS — H918X3 Other specified hearing loss, bilateral: Secondary | ICD-10-CM | POA: Diagnosis not present

## 2015-10-16 DIAGNOSIS — Z4801 Encounter for change or removal of surgical wound dressing: Secondary | ICD-10-CM | POA: Diagnosis not present

## 2015-10-16 DIAGNOSIS — I129 Hypertensive chronic kidney disease with stage 1 through stage 4 chronic kidney disease, or unspecified chronic kidney disease: Secondary | ICD-10-CM | POA: Diagnosis not present

## 2015-10-16 DIAGNOSIS — S72011D Unspecified intracapsular fracture of right femur, subsequent encounter for closed fracture with routine healing: Secondary | ICD-10-CM | POA: Diagnosis not present

## 2015-10-16 DIAGNOSIS — D509 Iron deficiency anemia, unspecified: Secondary | ICD-10-CM | POA: Diagnosis not present

## 2015-10-16 DIAGNOSIS — Z9181 History of falling: Secondary | ICD-10-CM | POA: Diagnosis not present

## 2015-10-16 DIAGNOSIS — W19XXXD Unspecified fall, subsequent encounter: Secondary | ICD-10-CM | POA: Diagnosis not present

## 2015-10-16 DIAGNOSIS — H918X3 Other specified hearing loss, bilateral: Secondary | ICD-10-CM | POA: Diagnosis not present

## 2015-10-16 DIAGNOSIS — Z79891 Long term (current) use of opiate analgesic: Secondary | ICD-10-CM | POA: Diagnosis not present

## 2015-10-16 DIAGNOSIS — N182 Chronic kidney disease, stage 2 (mild): Secondary | ICD-10-CM | POA: Diagnosis not present

## 2015-10-16 DIAGNOSIS — M1991 Primary osteoarthritis, unspecified site: Secondary | ICD-10-CM | POA: Diagnosis not present

## 2015-10-16 DIAGNOSIS — Z7901 Long term (current) use of anticoagulants: Secondary | ICD-10-CM | POA: Diagnosis not present

## 2015-10-16 DIAGNOSIS — I4891 Unspecified atrial fibrillation: Secondary | ICD-10-CM | POA: Diagnosis not present

## 2015-10-17 DIAGNOSIS — W19XXXD Unspecified fall, subsequent encounter: Secondary | ICD-10-CM | POA: Diagnosis not present

## 2015-10-17 DIAGNOSIS — Z79891 Long term (current) use of opiate analgesic: Secondary | ICD-10-CM | POA: Diagnosis not present

## 2015-10-17 DIAGNOSIS — Z4801 Encounter for change or removal of surgical wound dressing: Secondary | ICD-10-CM | POA: Diagnosis not present

## 2015-10-17 DIAGNOSIS — I129 Hypertensive chronic kidney disease with stage 1 through stage 4 chronic kidney disease, or unspecified chronic kidney disease: Secondary | ICD-10-CM | POA: Diagnosis not present

## 2015-10-17 DIAGNOSIS — Z7901 Long term (current) use of anticoagulants: Secondary | ICD-10-CM | POA: Diagnosis not present

## 2015-10-17 DIAGNOSIS — S72011D Unspecified intracapsular fracture of right femur, subsequent encounter for closed fracture with routine healing: Secondary | ICD-10-CM | POA: Diagnosis not present

## 2015-10-17 DIAGNOSIS — N182 Chronic kidney disease, stage 2 (mild): Secondary | ICD-10-CM | POA: Diagnosis not present

## 2015-10-17 DIAGNOSIS — M1991 Primary osteoarthritis, unspecified site: Secondary | ICD-10-CM | POA: Diagnosis not present

## 2015-10-17 DIAGNOSIS — H918X3 Other specified hearing loss, bilateral: Secondary | ICD-10-CM | POA: Diagnosis not present

## 2015-10-17 DIAGNOSIS — Z9181 History of falling: Secondary | ICD-10-CM | POA: Diagnosis not present

## 2015-10-17 DIAGNOSIS — I4891 Unspecified atrial fibrillation: Secondary | ICD-10-CM | POA: Diagnosis not present

## 2015-10-17 DIAGNOSIS — D509 Iron deficiency anemia, unspecified: Secondary | ICD-10-CM | POA: Diagnosis not present

## 2015-10-21 DIAGNOSIS — Z7901 Long term (current) use of anticoagulants: Secondary | ICD-10-CM | POA: Diagnosis not present

## 2015-10-21 DIAGNOSIS — N182 Chronic kidney disease, stage 2 (mild): Secondary | ICD-10-CM | POA: Diagnosis not present

## 2015-10-21 DIAGNOSIS — Z9181 History of falling: Secondary | ICD-10-CM | POA: Diagnosis not present

## 2015-10-21 DIAGNOSIS — Z79891 Long term (current) use of opiate analgesic: Secondary | ICD-10-CM | POA: Diagnosis not present

## 2015-10-21 DIAGNOSIS — S72011D Unspecified intracapsular fracture of right femur, subsequent encounter for closed fracture with routine healing: Secondary | ICD-10-CM | POA: Diagnosis not present

## 2015-10-21 DIAGNOSIS — D509 Iron deficiency anemia, unspecified: Secondary | ICD-10-CM | POA: Diagnosis not present

## 2015-10-21 DIAGNOSIS — I4891 Unspecified atrial fibrillation: Secondary | ICD-10-CM | POA: Diagnosis not present

## 2015-10-21 DIAGNOSIS — Z4801 Encounter for change or removal of surgical wound dressing: Secondary | ICD-10-CM | POA: Diagnosis not present

## 2015-10-21 DIAGNOSIS — W19XXXD Unspecified fall, subsequent encounter: Secondary | ICD-10-CM | POA: Diagnosis not present

## 2015-10-21 DIAGNOSIS — I129 Hypertensive chronic kidney disease with stage 1 through stage 4 chronic kidney disease, or unspecified chronic kidney disease: Secondary | ICD-10-CM | POA: Diagnosis not present

## 2015-10-21 DIAGNOSIS — M1991 Primary osteoarthritis, unspecified site: Secondary | ICD-10-CM | POA: Diagnosis not present

## 2015-10-21 DIAGNOSIS — H918X3 Other specified hearing loss, bilateral: Secondary | ICD-10-CM | POA: Diagnosis not present

## 2015-10-22 DIAGNOSIS — M1991 Primary osteoarthritis, unspecified site: Secondary | ICD-10-CM | POA: Diagnosis not present

## 2015-10-22 DIAGNOSIS — D509 Iron deficiency anemia, unspecified: Secondary | ICD-10-CM | POA: Diagnosis not present

## 2015-10-22 DIAGNOSIS — I129 Hypertensive chronic kidney disease with stage 1 through stage 4 chronic kidney disease, or unspecified chronic kidney disease: Secondary | ICD-10-CM | POA: Diagnosis not present

## 2015-10-22 DIAGNOSIS — H918X3 Other specified hearing loss, bilateral: Secondary | ICD-10-CM | POA: Diagnosis not present

## 2015-10-22 DIAGNOSIS — Z7901 Long term (current) use of anticoagulants: Secondary | ICD-10-CM | POA: Diagnosis not present

## 2015-10-22 DIAGNOSIS — Z79891 Long term (current) use of opiate analgesic: Secondary | ICD-10-CM | POA: Diagnosis not present

## 2015-10-22 DIAGNOSIS — W19XXXD Unspecified fall, subsequent encounter: Secondary | ICD-10-CM | POA: Diagnosis not present

## 2015-10-22 DIAGNOSIS — S72011D Unspecified intracapsular fracture of right femur, subsequent encounter for closed fracture with routine healing: Secondary | ICD-10-CM | POA: Diagnosis not present

## 2015-10-22 DIAGNOSIS — N182 Chronic kidney disease, stage 2 (mild): Secondary | ICD-10-CM | POA: Diagnosis not present

## 2015-10-22 DIAGNOSIS — Z4801 Encounter for change or removal of surgical wound dressing: Secondary | ICD-10-CM | POA: Diagnosis not present

## 2015-10-22 DIAGNOSIS — Z9181 History of falling: Secondary | ICD-10-CM | POA: Diagnosis not present

## 2015-10-22 DIAGNOSIS — I4891 Unspecified atrial fibrillation: Secondary | ICD-10-CM | POA: Diagnosis not present

## 2015-10-23 DIAGNOSIS — Z9181 History of falling: Secondary | ICD-10-CM | POA: Diagnosis not present

## 2015-10-23 DIAGNOSIS — Z7901 Long term (current) use of anticoagulants: Secondary | ICD-10-CM | POA: Diagnosis not present

## 2015-10-23 DIAGNOSIS — D509 Iron deficiency anemia, unspecified: Secondary | ICD-10-CM | POA: Diagnosis not present

## 2015-10-23 DIAGNOSIS — M1991 Primary osteoarthritis, unspecified site: Secondary | ICD-10-CM | POA: Diagnosis not present

## 2015-10-23 DIAGNOSIS — W19XXXD Unspecified fall, subsequent encounter: Secondary | ICD-10-CM | POA: Diagnosis not present

## 2015-10-23 DIAGNOSIS — I129 Hypertensive chronic kidney disease with stage 1 through stage 4 chronic kidney disease, or unspecified chronic kidney disease: Secondary | ICD-10-CM | POA: Diagnosis not present

## 2015-10-23 DIAGNOSIS — I4891 Unspecified atrial fibrillation: Secondary | ICD-10-CM | POA: Diagnosis not present

## 2015-10-23 DIAGNOSIS — N182 Chronic kidney disease, stage 2 (mild): Secondary | ICD-10-CM | POA: Diagnosis not present

## 2015-10-23 DIAGNOSIS — Z79891 Long term (current) use of opiate analgesic: Secondary | ICD-10-CM | POA: Diagnosis not present

## 2015-10-23 DIAGNOSIS — Z4801 Encounter for change or removal of surgical wound dressing: Secondary | ICD-10-CM | POA: Diagnosis not present

## 2015-10-23 DIAGNOSIS — S72011D Unspecified intracapsular fracture of right femur, subsequent encounter for closed fracture with routine healing: Secondary | ICD-10-CM | POA: Diagnosis not present

## 2015-10-23 DIAGNOSIS — H918X3 Other specified hearing loss, bilateral: Secondary | ICD-10-CM | POA: Diagnosis not present

## 2015-10-24 DIAGNOSIS — S72011D Unspecified intracapsular fracture of right femur, subsequent encounter for closed fracture with routine healing: Secondary | ICD-10-CM | POA: Diagnosis not present

## 2015-10-24 DIAGNOSIS — D509 Iron deficiency anemia, unspecified: Secondary | ICD-10-CM | POA: Diagnosis not present

## 2015-10-24 DIAGNOSIS — Z79891 Long term (current) use of opiate analgesic: Secondary | ICD-10-CM | POA: Diagnosis not present

## 2015-10-24 DIAGNOSIS — Z7901 Long term (current) use of anticoagulants: Secondary | ICD-10-CM | POA: Diagnosis not present

## 2015-10-24 DIAGNOSIS — M1991 Primary osteoarthritis, unspecified site: Secondary | ICD-10-CM | POA: Diagnosis not present

## 2015-10-24 DIAGNOSIS — Z9181 History of falling: Secondary | ICD-10-CM | POA: Diagnosis not present

## 2015-10-24 DIAGNOSIS — I129 Hypertensive chronic kidney disease with stage 1 through stage 4 chronic kidney disease, or unspecified chronic kidney disease: Secondary | ICD-10-CM | POA: Diagnosis not present

## 2015-10-24 DIAGNOSIS — Z4801 Encounter for change or removal of surgical wound dressing: Secondary | ICD-10-CM | POA: Diagnosis not present

## 2015-10-24 DIAGNOSIS — I4891 Unspecified atrial fibrillation: Secondary | ICD-10-CM | POA: Diagnosis not present

## 2015-10-24 DIAGNOSIS — W19XXXD Unspecified fall, subsequent encounter: Secondary | ICD-10-CM | POA: Diagnosis not present

## 2015-10-24 DIAGNOSIS — H918X3 Other specified hearing loss, bilateral: Secondary | ICD-10-CM | POA: Diagnosis not present

## 2015-10-24 DIAGNOSIS — N182 Chronic kidney disease, stage 2 (mild): Secondary | ICD-10-CM | POA: Diagnosis not present

## 2015-10-25 DIAGNOSIS — D509 Iron deficiency anemia, unspecified: Secondary | ICD-10-CM | POA: Diagnosis not present

## 2015-10-25 DIAGNOSIS — Z9181 History of falling: Secondary | ICD-10-CM | POA: Diagnosis not present

## 2015-10-25 DIAGNOSIS — I4891 Unspecified atrial fibrillation: Secondary | ICD-10-CM | POA: Diagnosis not present

## 2015-10-25 DIAGNOSIS — S72011D Unspecified intracapsular fracture of right femur, subsequent encounter for closed fracture with routine healing: Secondary | ICD-10-CM | POA: Diagnosis not present

## 2015-10-25 DIAGNOSIS — N182 Chronic kidney disease, stage 2 (mild): Secondary | ICD-10-CM | POA: Diagnosis not present

## 2015-10-25 DIAGNOSIS — H918X3 Other specified hearing loss, bilateral: Secondary | ICD-10-CM | POA: Diagnosis not present

## 2015-10-25 DIAGNOSIS — I129 Hypertensive chronic kidney disease with stage 1 through stage 4 chronic kidney disease, or unspecified chronic kidney disease: Secondary | ICD-10-CM | POA: Diagnosis not present

## 2015-10-25 DIAGNOSIS — M1991 Primary osteoarthritis, unspecified site: Secondary | ICD-10-CM | POA: Diagnosis not present

## 2015-10-25 DIAGNOSIS — Z4801 Encounter for change or removal of surgical wound dressing: Secondary | ICD-10-CM | POA: Diagnosis not present

## 2015-10-25 DIAGNOSIS — W19XXXD Unspecified fall, subsequent encounter: Secondary | ICD-10-CM | POA: Diagnosis not present

## 2015-10-25 DIAGNOSIS — Z79891 Long term (current) use of opiate analgesic: Secondary | ICD-10-CM | POA: Diagnosis not present

## 2015-10-25 DIAGNOSIS — Z7901 Long term (current) use of anticoagulants: Secondary | ICD-10-CM | POA: Diagnosis not present

## 2015-10-28 DIAGNOSIS — I4891 Unspecified atrial fibrillation: Secondary | ICD-10-CM | POA: Diagnosis not present

## 2015-10-28 DIAGNOSIS — D509 Iron deficiency anemia, unspecified: Secondary | ICD-10-CM | POA: Diagnosis not present

## 2015-10-28 DIAGNOSIS — Z7901 Long term (current) use of anticoagulants: Secondary | ICD-10-CM | POA: Diagnosis not present

## 2015-10-28 DIAGNOSIS — I129 Hypertensive chronic kidney disease with stage 1 through stage 4 chronic kidney disease, or unspecified chronic kidney disease: Secondary | ICD-10-CM | POA: Diagnosis not present

## 2015-10-28 DIAGNOSIS — Z9181 History of falling: Secondary | ICD-10-CM | POA: Diagnosis not present

## 2015-10-28 DIAGNOSIS — W19XXXD Unspecified fall, subsequent encounter: Secondary | ICD-10-CM | POA: Diagnosis not present

## 2015-10-28 DIAGNOSIS — M1991 Primary osteoarthritis, unspecified site: Secondary | ICD-10-CM | POA: Diagnosis not present

## 2015-10-28 DIAGNOSIS — H918X3 Other specified hearing loss, bilateral: Secondary | ICD-10-CM | POA: Diagnosis not present

## 2015-10-28 DIAGNOSIS — Z4801 Encounter for change or removal of surgical wound dressing: Secondary | ICD-10-CM | POA: Diagnosis not present

## 2015-10-28 DIAGNOSIS — Z79891 Long term (current) use of opiate analgesic: Secondary | ICD-10-CM | POA: Diagnosis not present

## 2015-10-28 DIAGNOSIS — S72011D Unspecified intracapsular fracture of right femur, subsequent encounter for closed fracture with routine healing: Secondary | ICD-10-CM | POA: Diagnosis not present

## 2015-10-28 DIAGNOSIS — N182 Chronic kidney disease, stage 2 (mild): Secondary | ICD-10-CM | POA: Diagnosis not present

## 2015-10-31 DIAGNOSIS — Z79891 Long term (current) use of opiate analgesic: Secondary | ICD-10-CM | POA: Diagnosis not present

## 2015-10-31 DIAGNOSIS — Z4801 Encounter for change or removal of surgical wound dressing: Secondary | ICD-10-CM | POA: Diagnosis not present

## 2015-10-31 DIAGNOSIS — N182 Chronic kidney disease, stage 2 (mild): Secondary | ICD-10-CM | POA: Diagnosis not present

## 2015-10-31 DIAGNOSIS — D509 Iron deficiency anemia, unspecified: Secondary | ICD-10-CM | POA: Diagnosis not present

## 2015-10-31 DIAGNOSIS — I129 Hypertensive chronic kidney disease with stage 1 through stage 4 chronic kidney disease, or unspecified chronic kidney disease: Secondary | ICD-10-CM | POA: Diagnosis not present

## 2015-10-31 DIAGNOSIS — I4891 Unspecified atrial fibrillation: Secondary | ICD-10-CM | POA: Diagnosis not present

## 2015-10-31 DIAGNOSIS — H918X3 Other specified hearing loss, bilateral: Secondary | ICD-10-CM | POA: Diagnosis not present

## 2015-10-31 DIAGNOSIS — M1991 Primary osteoarthritis, unspecified site: Secondary | ICD-10-CM | POA: Diagnosis not present

## 2015-10-31 DIAGNOSIS — Z7901 Long term (current) use of anticoagulants: Secondary | ICD-10-CM | POA: Diagnosis not present

## 2015-10-31 DIAGNOSIS — Z9181 History of falling: Secondary | ICD-10-CM | POA: Diagnosis not present

## 2015-10-31 DIAGNOSIS — S72011D Unspecified intracapsular fracture of right femur, subsequent encounter for closed fracture with routine healing: Secondary | ICD-10-CM | POA: Diagnosis not present

## 2015-10-31 DIAGNOSIS — W19XXXD Unspecified fall, subsequent encounter: Secondary | ICD-10-CM | POA: Diagnosis not present

## 2015-11-04 DIAGNOSIS — I480 Paroxysmal atrial fibrillation: Secondary | ICD-10-CM | POA: Diagnosis not present

## 2015-11-12 ENCOUNTER — Ambulatory Visit: Payer: Self-pay | Admitting: Gastroenterology

## 2015-12-10 ENCOUNTER — Ambulatory Visit
Admission: RE | Admit: 2015-12-10 | Discharge: 2015-12-10 | Disposition: A | Discharge status: Home / Self Care | Payer: Medicare Other | Source: Ambulatory Visit | Attending: Geriatric Medicine | Admitting: Geriatric Medicine

## 2015-12-10 ENCOUNTER — Other Ambulatory Visit: Payer: Self-pay | Admitting: Geriatric Medicine

## 2015-12-10 DIAGNOSIS — M546 Pain in thoracic spine: Secondary | ICD-10-CM

## 2016-05-22 ENCOUNTER — Other Ambulatory Visit: Payer: Self-pay | Admitting: Geriatric Medicine

## 2016-05-22 DIAGNOSIS — R1319 Other dysphagia: Secondary | ICD-10-CM

## 2016-05-22 DIAGNOSIS — R1310 Dysphagia, unspecified: Secondary | ICD-10-CM

## 2016-05-26 ENCOUNTER — Ambulatory Visit
Admission: RE | Admit: 2016-05-26 | Discharge: 2016-05-26 | Disposition: A | Discharge status: Home / Self Care | Payer: Medicare Other | Source: Ambulatory Visit | Attending: Geriatric Medicine | Admitting: Geriatric Medicine

## 2016-05-26 DIAGNOSIS — R1319 Other dysphagia: Secondary | ICD-10-CM

## 2016-05-26 DIAGNOSIS — R1310 Dysphagia, unspecified: Secondary | ICD-10-CM

## 2016-05-29 ENCOUNTER — Emergency Department (HOSPITAL_COMMUNITY): Payer: Medicare Other

## 2016-05-29 ENCOUNTER — Other Ambulatory Visit: Payer: Self-pay

## 2016-05-29 ENCOUNTER — Emergency Department (HOSPITAL_COMMUNITY)
Admission: EM | Admit: 2016-05-29 | Discharge: 2016-05-29 | Disposition: A | Discharge status: Home / Self Care | Payer: Medicare Other | Attending: Emergency Medicine | Admitting: Emergency Medicine

## 2016-05-29 ENCOUNTER — Encounter (HOSPITAL_COMMUNITY): Payer: Self-pay | Admitting: Emergency Medicine

## 2016-05-29 DIAGNOSIS — Z8673 Personal history of transient ischemic attack (TIA), and cerebral infarction without residual deficits: Secondary | ICD-10-CM | POA: Insufficient documentation

## 2016-05-29 DIAGNOSIS — I679 Cerebrovascular disease, unspecified: Secondary | ICD-10-CM | POA: Insufficient documentation

## 2016-05-29 DIAGNOSIS — Z87891 Personal history of nicotine dependence: Secondary | ICD-10-CM | POA: Diagnosis not present

## 2016-05-29 DIAGNOSIS — I10 Essential (primary) hypertension: Secondary | ICD-10-CM | POA: Insufficient documentation

## 2016-05-29 DIAGNOSIS — R000 Tachycardia, unspecified: Secondary | ICD-10-CM | POA: Diagnosis present

## 2016-05-29 DIAGNOSIS — Z79899 Other long term (current) drug therapy: Secondary | ICD-10-CM | POA: Diagnosis not present

## 2016-05-29 DIAGNOSIS — D649 Anemia, unspecified: Secondary | ICD-10-CM | POA: Diagnosis not present

## 2016-05-29 DIAGNOSIS — Z7901 Long term (current) use of anticoagulants: Secondary | ICD-10-CM | POA: Insufficient documentation

## 2016-05-29 DIAGNOSIS — D6489 Other specified anemias: Secondary | ICD-10-CM

## 2016-05-29 DIAGNOSIS — I951 Orthostatic hypotension: Secondary | ICD-10-CM | POA: Diagnosis not present

## 2016-05-29 LAB — BASIC METABOLIC PANEL
ANION GAP: 7 (ref 5–15)
BUN: 28 mg/dL — ABNORMAL HIGH (ref 6–20)
CHLORIDE: 107 mmol/L (ref 101–111)
CO2: 26 mmol/L (ref 22–32)
Calcium: 8.8 mg/dL — ABNORMAL LOW (ref 8.9–10.3)
Creatinine, Ser: 0.92 mg/dL (ref 0.61–1.24)
GFR calc non Af Amer: >60 mL/min (ref >60)
Glucose, Bld: 103 mg/dL — ABNORMAL HIGH (ref 65–99)
POTASSIUM: 4.6 mmol/L (ref 3.5–5.1)
Sodium: 140 mmol/L (ref 135–145)

## 2016-05-29 LAB — I-STAT TROPONIN, ED: Troponin i, poc: 0.01 ng/mL (ref 0.00–0.08)

## 2016-05-29 LAB — CBC
HCT: 29.8 % — ABNORMAL LOW (ref 39.0–52.0)
HEMOGLOBIN: 9.1 g/dL — AB (ref 13.0–17.0)
MCH: 31 pg (ref 26.0–34.0)
MCHC: 30.5 g/dL (ref 30.0–36.0)
MCV: 101.4 fL — AB (ref 78.0–100.0)
Platelets: 274 10*3/uL (ref 150–400)
RBC: 2.94 MIL/uL — AB (ref 4.22–5.81)
RDW: 13.2 % (ref 11.5–15.5)
WBC: 4.9 10*3/uL (ref 4.0–10.5)

## 2016-05-29 MED ORDER — DILTIAZEM HCL-DEXTROSE 100-5 MG/100ML-% IV SOLN (PREMIX): 5.0000 mg/h | INTRAVENOUS | Status: DC

## 2016-05-29 MED ORDER — DILTIAZEM LOAD VIA INFUSION
10.0000 mg | Freq: Once | INTRAVENOUS | Status: DC
  Filled 2016-05-29: qty 10

## 2016-05-29 MED ORDER — GI COCKTAIL ~~LOC~~
30.0000 mL | Freq: Once | ORAL | Status: AC
  Administered 2016-05-29: 30 mL via ORAL
  Filled 2016-05-29: qty 30

## 2016-05-29 MED ORDER — SODIUM CHLORIDE 0.9 % IV BOLUS (SEPSIS): 1000.0000 mL | Freq: Once | INTRAVENOUS | Status: DC

## 2016-05-29 NOTE — ED Notes (Signed)
Pt left AMA. Pt was informed of dangers involved with leaving prior to completion of medical workup. EDP notified. AMA paperwork completed and given to pt prior to pt leaving department.

## 2016-05-29 NOTE — ED Triage Notes (Signed)
Per pt, visited his PCP at 145pm today and was told to come to the ER for hypotension and Afib RVR. HR is 132. Pt c/o pain "everywhere". Pt hard of hearing, pt appears well and not in distress.

## 2016-05-29 NOTE — ED Notes (Signed)
ED Provider at bedside. 

## 2016-05-29 NOTE — ED Notes (Signed)
Pt attempted to walk away from room. NT locating wheelchair to get pt to lobby safely.

## 2016-05-29 NOTE — ED Notes (Signed)
Pt disconnected self from monitor and removed cuff, leads, and pulse ox cable. Pt was getting dressed as Charity fundraiserN entered room. Pt is refusing further tx at this time. EDP notified.

## 2016-05-29 NOTE — ED Triage Notes (Signed)
Pt not on any blood thinners for afib.

## 2016-05-29 NOTE — Progress Notes (Signed)
Called to admit patient, but before I could evaluate patient, I was called that patient wanted to leave, despite being told about the risks of leaving. Patient signed AMA papers and left building before I could see or evaluate pt.   Jeff CarinaEjiroghene Evita Merida, MD TRH.

## 2016-05-29 NOTE — ED Provider Notes (Signed)
[facility]-EMERGENCY DEPT Provider Note   CSN: [id number] Arrival date & time: 05/29/16  1505     History   Chief Complaint Chief Complaint  Patient presents with  . Tachycardia    HPI Jeff Hunt is a 83 y.o. male.  HPI  [facility] with [facility] pressures. He went to the clinic to be assessed for recurrent daily chest pain that is typically noted at night. Pain is nonexertional, nonradiating, and usually resolves throughout the night. Patient reports that he wakes up pain free. In addition patient also reports migrating back pain which typically improves with massage. Currently he is pain-free. He denies any bladder or bowel incontinence. Patient also reports decreased PO intake due to worsening dysphagia. He reports that he is unable to tolerate much solid foods however is able to drink Ensures. Because of his inability to tolerate solids, patient has not taking his prescription medicine for about 2 weeks. In addition patient has had GI bleed requiring him to stay off his Xarelto.   Past Medical History:  Diagnosis Date  . Arthropathy, unspecified, site unspecified   . BPH with obstruction/lower urinary tract symptoms   . Cataract    cortical senile  . Cerebrovascular disease, unspecified   . Chronic renal insufficiency, stage II (mild)   . Chronic rhinitis   . Chronic venous insufficiency   . Coronary atherosclerosis of unspecified type of vessel, native or graft    s/p stent placement "decades ago", hx of cath after this which showed patent stent per pt report.  Marland Kitchen. DEPRESSION 12/06/2006   Qualifier: Diagnosis of  By: Roxan Hockeyobinson CMA, Shanda BumpsJessica    . Depressive disorder, not elsewhere classified   . Hearing loss of both ears   . History of actinic keratoses 08/2011  . History of iron deficiency anemia 2013   Pt declined GI eval on multiple occasions  . Lichen simplex chronicus 08/2011  . Lumbar degenerative disc disease 06/2009   Mild  osteoarthritic changes of L-spine diffusely on x-rays  . Memory loss   . Osteoarthritis 06/2009   X-rays: both hips-mild  . Other and unspecified hyperlipidemia   . Rectal bleeding 2015   on/off x 3 mo; [facility] Dul[facility]law [facility]; CT abd/pelvis + Anusol HC supp regimen rx'd.  It appears the CT was ordered but not done (as of 02/20/14)  . TRANSIENT ISCHEMIC ATTACK, HX OF 12/06/2006   Qualifier: Diagnosis of  By: Roxan Hockeyobinson CMA, Shanda BumpsJessica    . Urticarial dermatitis    Skin biopsy 11/2010 favored "urticarial allergic rxn related to localized hypersensitivity such as insect bite or arthropod assault"  . Venous stasis dermatitis, left 07/2007   Biopsy showed angiodermatitis, with all tests for infection NEG (fungal stain, AFB stain, and gram stain).    Patient Active Problem List   Diagnosis Date Noted  . Pressure ulcer 08/30/2015  . Acute urinary retention 08/29/2015  . Hip fracture (HCC) 08/24/2015  . Fall   . Femoral neck fracture (HCC) 08/23/2015  . Atrial fibrillation (HCC) 08/23/2015  . Rectal bleeding 11/06/2013  . GERD (gastroesophageal reflux disease) 03/15/2012  . Atypical chest pain 02/24/2012  . Loss of weight 01/01/2012  . Dizziness 08/20/2011  . Rotator cuff impingement syndrome 07/22/2011  . HTN (hypertension), benign 03/16/2011  . Rash 03/06/2011  . Irregular cardiac rhythm 02/18/2011  . Peripheral edema 02/18/2011  . Anemia, iron deficiency 02/03/2011  . SKIN LESIONS, MULTIPLE 12/18/2009  . LEG PAIN 12/03/2009  .  VENOUS INSUFFICIENCY, LEGS 06/14/2009  . HEMATOCHEZIA 12/06/2008  . GROSS HEMATURIA 12/06/2008  . ONYCHOMYCOSIS, TOENAILS 08/09/2007  . DERMATITIS, STASIS 08/09/2007  . MEMORY LOSS 08/01/2007  . HYPERLIPIDEMIA 12/06/2006  . DEPRESSION 12/06/2006  . Coronary atherosclerosis 12/06/2006  . CEREBROVASCULAR DISEASE 12/06/2006  . BENIGN PROSTATIC HYPERTROPHY 12/06/2006  . ARTHRITIS 12/06/2006    Past Surgical History:  Procedure Laterality Date  . ANTERIOR  APPROACH HEMI HIP ARTHROPLASTY Right 08/24/2015   Procedure: ANTERIOR APPROACH HEMI HIP ARTHROPLASTY;  Surgeon: Cammy Copa, MD;  Location: [facility] OR;  Service: Orthopedics;  Laterality: Right;  . APPENDECTOMY    . CATARACT EXTRACTION  10/24/2007   right  . CATARACT EXTRACTION  11/07/2007   left  . CORONARY ANGIOPLASTY WITH STENT PLACEMENT    . TONSILLECTOMY         Home Medications    Prior to Admission medications   Medication Sig Start Date End Date Taking? Authorizing Provider  acetaZOLAMIDE (DIAMOX) 250 MG tablet ALTERNATE 2 TABLETS BY MOUTH EVERY DAY WITH 1 TABLET BY MOUTH EVERY DAY. 05/14/14   McGowen, Maryjean Morn, MD  citalopram (CELEXA) 20 MG tablet Take 20 mg by mouth daily. 08/16/15   [provider]  ferrous sulfate 325 (65 FE) MG tablet Take 325 mg by mouth 2 (two) times daily.    [provider]  fluticasone (FLONASE) 50 MCG/ACT nasal spray INSTILL 2 SPRAYS INTO EACH NOSTRIL DAILY 01/11/14   McGowen, Maryjean Morn, MD  HYDROcodone-acetaminophen (NORCO/VICODIN) 5-325 MG tablet Take 1 tablet by mouth every 6 (six) hours as needed for moderate pain or severe pain. 08/29/15   Cammy Copa, MD  metoprolol tartrate (LOPRESSOR) 25 MG tablet Take 1 tablet (25 mg total) by mouth 2 (two) times daily. 08/30/15   Renae Fickle, MD  pantoprazole (PROTONIX) 40 MG tablet Take 1 tablet (40 mg total) by mouth daily. 04/30/14   McGowen, Maryjean Morn, MD  polycarbophil (FIBERCON) 625 MG tablet Take 625 mg by mouth daily.    [provider]  Potassium Gluconate 550 MG TABS Take 1 tablet by mouth daily.    [provider]  rivaroxaban (XARELTO) 20 MG TABS tablet Take 1 tablet (20 mg total) by mouth daily with breakfast. 08/30/15   Renae Fickle, MD  simvastatin (ZOCOR) 80 MG tablet TAKE 1 TABLET BY MOUTH AT BEDTIME 06/13/14   McGowen, Maryjean Morn, MD  tamsulosin (FLOMAX) 0.4 MG CAPS capsule Take 1 capsule (0.4 mg total) by mouth daily. 04/16/14   McGowen, Maryjean Morn, MD    triamcinolone (KENALOG) 0.025 % cream Apply 2-3 times daily to skin eruptions, hives    [provider]    Family History Family History  Problem Relation Age of Onset  . Heart disease Father 58  . Hypertension Mother   . Dementia Sister   . Colon cancer Neg Hx     Social History Social History  Substance Use Topics  . Smoking status: Former Smoker    Types: Pipe  . Smokeless tobacco: Never Used     Comment: Quit > 23 yrs ago.  . Alcohol use 10.5 oz/week    21 Standard drinks or equivalent per week     Allergies   Grapefruit concentrate; Amoxicillin; and Penicillins   Review of Systems Review of Systems All other systems are reviewed and are negative for acute change except as noted in the HPI   Physical Exam Updated Vital Signs BP 128/88   Pulse (!) 124   Temp 98.4 F (  36.9 C) (Oral)   Resp 14   SpO2 100%   Physical Exam  Constitutional: He is oriented to person, place, and time. He appears well-developed and well-nourished. No distress.  HENT:  Head: Normocephalic and atraumatic.  Nose: Nose normal.  Eyes: Conjunctivae and EOM are normal. Pupils are equal, round, and reactive to light. Right eye exhibits no discharge. Left eye exhibits no discharge. No scleral icterus.  Neck: Normal range of motion. Neck supple.  Cardiovascular: Normal rate and regular rhythm.  Exam reveals no gallop and no friction rub.   No murmur heard. Pulmonary/Chest: Effort normal and breath sounds normal. No stridor. No respiratory distress. He has no rales.  Abdominal: Soft. He exhibits no distension. There is no tenderness.  Musculoskeletal: He exhibits no edema or tenderness.  Neurological: He is alert and oriented to person, place, and time.  Spine Exam: Strength: 5/5 throughout LE bilaterally Sensation: Intact to light touch in proximal and distal LE bilaterally Reflexes: 1+ quadriceps and achilles reflexes  Skin: Skin is warm and dry. No rash noted. He is not  diaphoretic. No erythema.  Psychiatric: He has a normal mood and affect.  Vitals reviewed.    ED Treatments / Results  Labs (all labs ordered are listed, but only abnormal results are displayed) Labs Reviewed  BASIC METABOLIC PANEL - Abnormal; Notable for the following:       Result Value   Glucose, Bld 103 (*)    BUN 28 (*)    Calcium 8.8 (*)    All other components within normal limits  CBC - Abnormal; Notable for the following:    RBC 2.94 (*)    Hemoglobin 9.1 (*)    HCT 29.8 (*)    MCV 101.4 (*)    All other components within normal limits  I-STAT TROPOININ, ED    EKG  EKG Interpretation  Date/Time: 05/29/2016   Ventricular Rate:   90 QRS Duration:  117 QT Interval:   435 QTC Calculation:  533 Text Interpretation:  atrial flutter with 3-1 AV block. Incomplete right bundle branch block. No evidence of acute ischemia.  Radiology Dg Chest 2 View  Result Date: 05/29/2016 CLINICAL DATA:  Chest pain EXAM: CHEST  2 VIEW COMPARISON:  08/23/2015 FINDINGS: Normal heart size. Aortic tortuosity that is chronic. Chronic subpleural reticulation greatest at the bases. Aeration is improved since prior. There is no edema, consolidation, effusion, or pneumothorax. Spondylosis. Barium within the colon, primarily outlining diverticula. IMPRESSION: No acute finding. Electronically Signed   By: Marnee Spring M.D.   On: 05/29/2016 16:23   Dg Lumbar Spine 2-3 Views  Result Date: 05/29/2016 CLINICAL DATA:  Back pain. EXAM: LUMBAR SPINE - 2-3 VIEW COMPARISON:  Lumbar spine radiographs 06/25/2009 and 05/07/2005. Thoracic spine radiographs 12/10/2015. FINDINGS: Bone detail is limited by a large amount of residual barium throughout the colon related to recent esophagram. There are multiple colonic diverticula. There are 5 lumbar type vertebral bodies. There is a superior endplate compression deformity at L1, stable from 2017 thoracic spine radiographs. This is new from 2011. There is a grade 1  degenerative anterolisthesis at L4-5. Mild disc space narrowing and facet hypertrophy noted throughout the lumbar spine. No acute osseous findings are evident. Right hip hemiarthroplasty noted. IMPRESSION: No acute osseous findings demonstrated. Superior endplate compression deformity at L1 is stable from thoracic radiographs of 5 months ago. Multilevel spondylosis. Electronically Signed   By: Carey Bullocks M.D.   On: 05/29/2016 18:40    Procedures Procedures (including  critical care time)  Medications Ordered in ED Medications  gi cocktail (Maalox,Lidocaine,Donnatal) (30 mLs Oral Given 05/29/16 1713)     Initial Impression / Assessment and Plan / ED Course  I have reviewed the triage vital signs and the nursing notes.  Pertinent labs & imaging results that were available during my care of the patient were reviewed by me and considered in my medical decision making (see chart for details).     Currently the patient is pain-free. EKG without acute ischemic changes or evidence of pericarditis. Initial troponin negative. Patient noted to be orthostatic with increase in his heart rate while sitting up. Improved with laying down. Hemoglobin close the patient's baseline at 9.1. Given the patient's history of decreased by mouth intake with recent GI bleed and orthostasis we recommended admission for further workup and management. The patient and caregiver declined. Patient refused IV hydration. Requested that he be discharged. Risks of leaving without further workup and management were discussed. Patient shows to leave AMA. Patient was instructed to find a way to take his home medicines except his Xarelto.  Final Clinical Impressions(s) / ED Diagnoses   Final diagnoses:  Orthostasis  Anemia due to other cause, not classified   Disposition: Discharge AMA  Condition: stable  I have discussed the results, Dx and Tx plan with the patient who expressed understanding and agree(s) with the plan.  Discharge instructions discussed at great length. The patient was given strict return precautions who verbalized understanding of the instructions. No further questions at time of discharge.    Discharge Medication List as of 05/29/2016  7:12 PM      Follow Up: Primary care provider  Schedule an appointment as soon as possible for a visit in 1 day       Cardama, Amadeo Garnet, MD 05/30/16 [phone]

## 2016-05-29 NOTE — ED Notes (Signed)
Pt refused to allow RN to place IV. EDP informed. EDP instructed RN to give apple juice. RN complied.

## 2016-06-10 ENCOUNTER — Other Ambulatory Visit (HOSPITAL_COMMUNITY): Payer: Self-pay | Admitting: Geriatric Medicine

## 2016-06-10 DIAGNOSIS — R1310 Dysphagia, unspecified: Secondary | ICD-10-CM

## 2016-06-11 ENCOUNTER — Ambulatory Visit (HOSPITAL_COMMUNITY): Payer: Medicare Other

## 2016-06-17 ENCOUNTER — Other Ambulatory Visit (HOSPITAL_COMMUNITY): Payer: Medicare Other

## 2016-06-17 ENCOUNTER — Ambulatory Visit (HOSPITAL_COMMUNITY): Payer: Medicare Other

## 2016-07-22 ENCOUNTER — Emergency Department (HOSPITAL_COMMUNITY): Payer: Medicare Other

## 2016-07-22 ENCOUNTER — Inpatient Hospital Stay (HOSPITAL_COMMUNITY): Payer: Medicare Other

## 2016-07-22 ENCOUNTER — Inpatient Hospital Stay (HOSPITAL_COMMUNITY)
Admission: EM | Admit: 2016-07-22 | Discharge: 2016-08-05 | DRG: 871 | Disposition: E | Payer: Medicare Other | Attending: Internal Medicine | Admitting: Internal Medicine

## 2016-07-22 ENCOUNTER — Encounter (HOSPITAL_COMMUNITY): Payer: Self-pay | Admitting: *Deleted

## 2016-07-22 DIAGNOSIS — F329 Major depressive disorder, single episode, unspecified: Secondary | ICD-10-CM | POA: Diagnosis present

## 2016-07-22 DIAGNOSIS — J690 Pneumonitis due to inhalation of food and vomit: Secondary | ICD-10-CM | POA: Diagnosis present

## 2016-07-22 DIAGNOSIS — I2510 Atherosclerotic heart disease of native coronary artery without angina pectoris: Secondary | ICD-10-CM | POA: Diagnosis not present

## 2016-07-22 DIAGNOSIS — J9811 Atelectasis: Secondary | ICD-10-CM | POA: Diagnosis present

## 2016-07-22 DIAGNOSIS — E872 Acidosis, unspecified: Secondary | ICD-10-CM

## 2016-07-22 DIAGNOSIS — Z87891 Personal history of nicotine dependence: Secondary | ICD-10-CM | POA: Diagnosis not present

## 2016-07-22 DIAGNOSIS — J948 Other specified pleural conditions: Secondary | ICD-10-CM | POA: Diagnosis not present

## 2016-07-22 DIAGNOSIS — Z96641 Presence of right artificial hip joint: Secondary | ICD-10-CM | POA: Diagnosis present

## 2016-07-22 DIAGNOSIS — Z955 Presence of coronary angioplasty implant and graft: Secondary | ICD-10-CM

## 2016-07-22 DIAGNOSIS — H9190 Unspecified hearing loss, unspecified ear: Secondary | ICD-10-CM

## 2016-07-22 DIAGNOSIS — J869 Pyothorax without fistula: Secondary | ICD-10-CM | POA: Diagnosis present

## 2016-07-22 DIAGNOSIS — J181 Lobar pneumonia, unspecified organism: Secondary | ICD-10-CM

## 2016-07-22 DIAGNOSIS — A419 Sepsis, unspecified organism: Principal | ICD-10-CM | POA: Diagnosis present

## 2016-07-22 DIAGNOSIS — I2583 Coronary atherosclerosis due to lipid rich plaque: Secondary | ICD-10-CM | POA: Diagnosis present

## 2016-07-22 DIAGNOSIS — I4892 Unspecified atrial flutter: Secondary | ICD-10-CM | POA: Diagnosis present

## 2016-07-22 DIAGNOSIS — Z8249 Family history of ischemic heart disease and other diseases of the circulatory system: Secondary | ICD-10-CM

## 2016-07-22 DIAGNOSIS — N179 Acute kidney failure, unspecified: Secondary | ICD-10-CM | POA: Diagnosis present

## 2016-07-22 DIAGNOSIS — L8990 Pressure ulcer of unspecified site, unspecified stage: Secondary | ICD-10-CM | POA: Diagnosis present

## 2016-07-22 DIAGNOSIS — I129 Hypertensive chronic kidney disease with stage 1 through stage 4 chronic kidney disease, or unspecified chronic kidney disease: Secondary | ICD-10-CM | POA: Diagnosis present

## 2016-07-22 DIAGNOSIS — I482 Chronic atrial fibrillation: Secondary | ICD-10-CM | POA: Diagnosis present

## 2016-07-22 DIAGNOSIS — R0602 Shortness of breath: Secondary | ICD-10-CM

## 2016-07-22 DIAGNOSIS — I10 Essential (primary) hypertension: Secondary | ICD-10-CM | POA: Diagnosis present

## 2016-07-22 DIAGNOSIS — E785 Hyperlipidemia, unspecified: Secondary | ICD-10-CM | POA: Diagnosis present

## 2016-07-22 DIAGNOSIS — F0391 Unspecified dementia with behavioral disturbance: Secondary | ICD-10-CM | POA: Diagnosis present

## 2016-07-22 DIAGNOSIS — Z66 Do not resuscitate: Secondary | ICD-10-CM | POA: Diagnosis not present

## 2016-07-22 DIAGNOSIS — J189 Pneumonia, unspecified organism: Secondary | ICD-10-CM | POA: Diagnosis present

## 2016-07-22 DIAGNOSIS — Z880 Allergy status to penicillin: Secondary | ICD-10-CM | POA: Diagnosis not present

## 2016-07-22 DIAGNOSIS — R531 Weakness: Secondary | ICD-10-CM

## 2016-07-22 DIAGNOSIS — Z91018 Allergy to other foods: Secondary | ICD-10-CM | POA: Diagnosis not present

## 2016-07-22 DIAGNOSIS — L89152 Pressure ulcer of sacral region, stage 2: Secondary | ICD-10-CM | POA: Diagnosis present

## 2016-07-22 DIAGNOSIS — R1310 Dysphagia, unspecified: Secondary | ICD-10-CM | POA: Diagnosis present

## 2016-07-22 DIAGNOSIS — L89159 Pressure ulcer of sacral region, unspecified stage: Secondary | ICD-10-CM | POA: Diagnosis not present

## 2016-07-22 DIAGNOSIS — Z8673 Personal history of transient ischemic attack (TIA), and cerebral infarction without residual deficits: Secondary | ICD-10-CM

## 2016-07-22 DIAGNOSIS — N182 Chronic kidney disease, stage 2 (mild): Secondary | ICD-10-CM | POA: Diagnosis present

## 2016-07-22 DIAGNOSIS — H9193 Unspecified hearing loss, bilateral: Secondary | ICD-10-CM | POA: Diagnosis present

## 2016-07-22 DIAGNOSIS — Z515 Encounter for palliative care: Secondary | ICD-10-CM | POA: Diagnosis not present

## 2016-07-22 DIAGNOSIS — R0902 Hypoxemia: Secondary | ICD-10-CM | POA: Diagnosis present

## 2016-07-22 DIAGNOSIS — I4891 Unspecified atrial fibrillation: Secondary | ICD-10-CM | POA: Diagnosis not present

## 2016-07-22 DIAGNOSIS — J90 Pleural effusion, not elsewhere classified: Secondary | ICD-10-CM | POA: Diagnosis present

## 2016-07-22 DIAGNOSIS — J9690 Respiratory failure, unspecified, unspecified whether with hypoxia or hypercapnia: Secondary | ICD-10-CM | POA: Diagnosis present

## 2016-07-22 DIAGNOSIS — I959 Hypotension, unspecified: Secondary | ICD-10-CM | POA: Diagnosis present

## 2016-07-22 HISTORY — PX: IR THORACENTESIS ASP PLEURAL SPACE W/IMG GUIDE: IMG5380

## 2016-07-22 LAB — CBC WITH DIFFERENTIAL/PLATELET
Basophils Absolute: 0 10*3/uL (ref 0.0–0.1)
Basophils Relative: 0 %
EOS ABS: 0 10*3/uL (ref 0.0–0.7)
Eosinophils Relative: 0 %
HCT: 43.3 % (ref 39.0–52.0)
HEMOGLOBIN: 13.2 g/dL (ref 13.0–17.0)
LYMPHS ABS: 0.7 10*3/uL (ref 0.7–4.0)
LYMPHS PCT: 10 %
MCH: 27.3 pg (ref 26.0–34.0)
MCHC: 30.5 g/dL (ref 30.0–36.0)
MCV: 89.6 fL (ref 78.0–100.0)
Monocytes Absolute: 0.8 10*3/uL (ref 0.1–1.0)
Monocytes Relative: 12 %
NEUTROS PCT: 78 %
Neutro Abs: 5 10*3/uL (ref 1.7–7.7)
Platelets: 177 10*3/uL (ref 150–400)
RBC: 4.83 MIL/uL (ref 4.22–5.81)
RDW: 16.8 % — ABNORMAL HIGH (ref 11.5–15.5)
WBC: 6.5 10*3/uL (ref 4.0–10.5)

## 2016-07-22 LAB — I-STAT CG4 LACTIC ACID, ED
LACTIC ACID, VENOUS: 3.9 mmol/L — AB (ref 0.5–1.9)
Lactic Acid, Venous: 4.8 mmol/L (ref 0.5–1.9)

## 2016-07-22 LAB — COMPREHENSIVE METABOLIC PANEL
ALBUMIN: 2.1 g/dL — AB (ref 3.5–5.0)
ALT: 15 U/L — ABNORMAL LOW (ref 17–63)
ANION GAP: 13 (ref 5–15)
AST: 28 U/L (ref 15–41)
Alkaline Phosphatase: 91 U/L (ref 38–126)
BUN: 53 mg/dL — ABNORMAL HIGH (ref 6–20)
CO2: 21 mmol/L — AB (ref 22–32)
Calcium: 8.2 mg/dL — ABNORMAL LOW (ref 8.9–10.3)
Chloride: 106 mmol/L (ref 101–111)
Creatinine, Ser: 1.75 mg/dL — ABNORMAL HIGH (ref 0.61–1.24)
GFR calc Af Amer: 38 mL/min — ABNORMAL LOW (ref >60)
GFR calc non Af Amer: 33 mL/min — ABNORMAL LOW (ref >60)
GLUCOSE: 122 mg/dL — AB (ref 65–99)
POTASSIUM: 5.1 mmol/L (ref 3.5–5.1)
SODIUM: 140 mmol/L (ref 135–145)
Total Bilirubin: 1.5 mg/dL — ABNORMAL HIGH (ref 0.3–1.2)
Total Protein: 6.1 g/dL — ABNORMAL LOW (ref 6.5–8.1)

## 2016-07-22 LAB — LACTIC ACID, PLASMA
LACTIC ACID, VENOUS: 3.9 mmol/L — AB (ref 0.5–1.9)
LACTIC ACID, VENOUS: 3.5 mmol/L — AB (ref 0.5–1.9)

## 2016-07-22 LAB — BODY FLUID CELL COUNT WITH DIFFERENTIAL
OTHER CELLS FL: UNDETERMINED %
WBC FLUID: 2133 uL — AB (ref 0–1000)

## 2016-07-22 LAB — GLUCOSE, CAPILLARY: GLUCOSE-CAPILLARY: 98 mg/dL (ref 65–99)

## 2016-07-22 LAB — I-STAT ARTERIAL BLOOD GAS, ED
ACID-BASE DEFICIT: 6 mmol/L — AB (ref 0.0–2.0)
BICARBONATE: 17.4 mmol/L — AB (ref 20.0–28.0)
O2 Saturation: 99 %
PCO2 ART: 28.2 mmHg — AB (ref 32.0–48.0)
PH ART: 7.4 (ref 7.350–7.450)
PO2 ART: 120 mmHg — AB (ref 83.0–108.0)
Patient temperature: 99.8
TCO2: 18 mmol/L (ref 0–100)

## 2016-07-22 LAB — PROTIME-INR
INR: 1.47
PROTHROMBIN TIME: 17.9 s — AB (ref 11.4–15.2)

## 2016-07-22 LAB — MRSA PCR SCREENING: MRSA by PCR: NEGATIVE

## 2016-07-22 MED ORDER — ONDANSETRON HCL 4 MG/2ML IJ SOLN: 4.0000 mg | Freq: Four times a day (QID) | INTRAMUSCULAR | Status: DC | PRN

## 2016-07-22 MED ORDER — SODIUM CHLORIDE 0.9% FLUSH
3.0000 mL | Freq: Two times a day (BID) | INTRAVENOUS | Status: DC
  Administered 2016-07-22 (×2): 3 mL via INTRAVENOUS

## 2016-07-22 MED ORDER — METOPROLOL TARTRATE 5 MG/5ML IV SOLN
5.0000 mg | Freq: Four times a day (QID) | INTRAVENOUS | Status: DC
  Administered 2016-07-22 (×2): 5 mg via INTRAVENOUS
  Filled 2016-07-22 (×2): qty 5

## 2016-07-22 MED ORDER — SODIUM CHLORIDE 0.9 % IV BOLUS (SEPSIS)
500.0000 mL | Freq: Once | INTRAVENOUS | Status: AC
  Administered 2016-07-22: 500 mL via INTRAVENOUS

## 2016-07-22 MED ORDER — PANTOPRAZOLE SODIUM 40 MG PO TBEC: 40.0000 mg | DELAYED_RELEASE_TABLET | Freq: Every day | ORAL | Status: DC

## 2016-07-22 MED ORDER — ATORVASTATIN CALCIUM 40 MG PO TABS: 40.0000 mg | ORAL_TABLET | Freq: Every day | ORAL | Status: DC

## 2016-07-22 MED ORDER — ONDANSETRON HCL 4 MG PO TABS: 4.0000 mg | ORAL_TABLET | Freq: Four times a day (QID) | ORAL | Status: DC | PRN

## 2016-07-22 MED ORDER — AMIODARONE HCL IN DEXTROSE 360-4.14 MG/200ML-% IV SOLN
INTRAVENOUS | Status: AC
  Filled 2016-07-22: qty 200

## 2016-07-22 MED ORDER — ACETAMINOPHEN 650 MG RE SUPP
650.0000 mg | Freq: Once | RECTAL | Status: AC
  Administered 2016-07-22: 650 mg via RECTAL
  Filled 2016-07-22: qty 1

## 2016-07-22 MED ORDER — SODIUM CHLORIDE 0.9 % IV BOLUS (SEPSIS)
1000.0000 mL | Freq: Once | INTRAVENOUS | Status: AC
  Administered 2016-07-22: 1000 mL via INTRAVENOUS

## 2016-07-22 MED ORDER — ACETAMINOPHEN 650 MG RE SUPP: 650.0000 mg | Freq: Four times a day (QID) | RECTAL | Status: DC | PRN

## 2016-07-22 MED ORDER — DEXTROSE 5 % IV SOLN
2.0000 g | INTRAVENOUS | Status: DC
  Administered 2016-07-22: 2 g via INTRAVENOUS
  Filled 2016-07-22: qty 2

## 2016-07-22 MED ORDER — CITALOPRAM HYDROBROMIDE 20 MG PO TABS: 20.0000 mg | ORAL_TABLET | Freq: Every day | ORAL | Status: DC

## 2016-07-22 MED ORDER — ORAL CARE MOUTH RINSE
15.0000 mL | Freq: Two times a day (BID) | OROMUCOSAL | Status: DC
  Administered 2016-07-22: 15 mL via OROMUCOSAL

## 2016-07-22 MED ORDER — VANCOMYCIN HCL IN DEXTROSE 1-5 GM/200ML-% IV SOLN
1000.0000 mg | Freq: Once | INTRAVENOUS | Status: AC
  Administered 2016-07-22: 1000 mg via INTRAVENOUS
  Filled 2016-07-22: qty 200

## 2016-07-22 MED ORDER — METOPROLOL TARTRATE 25 MG PO TABS: 25.0000 mg | ORAL_TABLET | Freq: Two times a day (BID) | ORAL | Status: DC

## 2016-07-22 MED ORDER — ACETAMINOPHEN 325 MG PO TABS: 650.0000 mg | ORAL_TABLET | Freq: Once | ORAL | Status: DC

## 2016-07-22 MED ORDER — HYDROCODONE-ACETAMINOPHEN 5-325 MG PO TABS: 1.0000 | ORAL_TABLET | Freq: Four times a day (QID) | ORAL | Status: DC | PRN

## 2016-07-22 MED ORDER — ACETAMINOPHEN 325 MG PO TABS: 650.0000 mg | ORAL_TABLET | Freq: Four times a day (QID) | ORAL | Status: DC | PRN

## 2016-07-22 MED ORDER — VANCOMYCIN HCL IN DEXTROSE 750-5 MG/150ML-% IV SOLN
750.0000 mg | INTRAVENOUS | Status: DC
  Administered 2016-07-22: 750 mg via INTRAVENOUS
  Filled 2016-07-22: qty 150

## 2016-07-22 MED ORDER — TAMSULOSIN HCL 0.4 MG PO CAPS: 0.4000 mg | ORAL_CAPSULE | Freq: Every day | ORAL | Status: DC

## 2016-07-22 MED ORDER — SODIUM CHLORIDE 0.9 % IV SOLN
INTRAVENOUS | Status: DC
  Administered 2016-07-22: 100 mL/h via INTRAVENOUS
  Administered 2016-07-23: 05:00:00 via INTRAVENOUS

## 2016-07-22 MED ORDER — LIDOCAINE HCL (PF) 1 % IJ SOLN
INTRAMUSCULAR | Status: AC
  Filled 2016-07-22: qty 30

## 2016-07-22 MED ORDER — DEXTROSE 5 % IV SOLN
2.0000 g | Freq: Once | INTRAVENOUS | Status: AC
  Administered 2016-07-22: 2 g via INTRAVENOUS
  Filled 2016-07-22: qty 2

## 2016-07-22 MED ORDER — DEXTROSE 5 % IV SOLN: 2.0000 g | INTRAVENOUS | Status: DC

## 2016-07-22 NOTE — Consult Note (Signed)
[address]       [place] [address]             [phone]        Jeff Hunt [facility] Medical Record #[id number] Date of Birth: 17-Sep-1925  Referring: No ref. provider found Primary Care: [name], MD  Chief Complaint:    Chief Complaint  Patient presents with  . Weakness    History of Present Illness:     Jeff Hunt is an 83 year old male with a past medical history of CAD, hypertension, atrial fibrillation with RVR, AKI, and CAP of the left lower lobe who has been experiencing a cough with occasional choking over the last few weeks with associated lethargy. His symptoms became so severe that the caregiver at his house called EMS when he could not get the patient off the commode. The caregiver also shares that he has fallen at home and she believes it is due to fatigue/weakness which has been exacerbated by his lack of appetite. A CXR was performed which showed near complete opacification of the left hemithorax with compressive atelectasis. A CT was obtained which revealed a large loculated left pleural effusion with compressive atelectasis and a small right effusion. He was admitted and started on broad spectrum antibiotics and cultures were obtained. Radiology performed an US guided thoracentesis which yielded of thick green fluid earlier today. We were consulted for possible chest tube placement vs. surgery for moderate to large lateral basilar left hydropneumothorax. Of note most of this H and P was obtained from his caregiver and medical record since the patient is confused and is very hard of hearing.   Current Activity/ Functional Status: The patient has a full time caregiver who helps him 24/7. He has stopped eating and has to be encouraged to drink. He isn't moving as much as he was which prompted the call to EMS   Zubrod Score: At the time of surgery this patient's most appropriate activity status/level should be described  as: []     0    Normal activity, no symptoms []     1    Restricted in physical strenuous activity but ambulatory, able to do out light work []     2    Ambulatory and capable of self care, unable to do work activities, up and about                 more than 50%  Of the time                            [x]     3    Only limited self care, in bed greater than 50% of waking hours []     4    Completely disabled, no self care, confined to bed or chair []     5    Moribund  Past Medical History:  Diagnosis Date  . Arthropathy, unspecified, site unspecified   . BPH with obstruction/lower urinary tract symptoms   . Cataract    cortical senile  . Cerebrovascular disease, unspecified   . Chronic renal insufficiency, stage II (mild)   . Chronic rhinitis   . Chronic venous insufficiency   . Coronary atherosclerosis of unspecified type of vessel, native or graft    s/p stent placement "decades ago", hx of cath after this which showed patent stent per pt report.  Marland Kitchen DEPRESSION 12/06/2006  Qualifier: Diagnosis of  By: Roxan Hockey CMA, Shanda Bumps    . Depressive disorder, not elsewhere classified   . Hearing loss of both ears   . History of actinic keratoses 08/2011  . History of iron deficiency anemia 05/28/2011   Pt declined GI eval on multiple occasions  . Lichen simplex chronicus 08/2011  . Lumbar degenerative disc disease 06/2009   Mild osteoarthritic changes of L-spine diffusely on x-rays  . Memory loss   . Osteoarthritis 06/2009   X-rays: both hips-mild  . Other and unspecified hyperlipidemia   . Rectal bleeding 2015   on/off x 3 mo; saw Dr. Dulce Sellar with [facility]; CT abd/pelvis + Anusol HC supp regimen rx'd.  It appears the CT was ordered but not done (as of 02/20/14)  . TRANSIENT ISCHEMIC ATTACK, HX OF 12/06/2006   Qualifier: Diagnosis of  By: Roxan Hockey CMA, Shanda Bumps    . Urticarial dermatitis    Skin biopsy 11/2010 favored "urticarial allergic rxn related to localized hypersensitivity such as insect bite or  arthropod assault"  . Venous stasis dermatitis, left 07/2007   Biopsy showed angiodermatitis, with all tests for infection NEG (fungal stain, AFB stain, and gram stain).    Past Surgical History:  Procedure Laterality Date  . ANTERIOR APPROACH HEMI HIP ARTHROPLASTY Right 08/24/2015   Procedure: ANTERIOR APPROACH HEMI HIP ARTHROPLASTY;  Surgeon: Cammy Copa, MD;  Location: [facility] OR;  Service: Orthopedics;  Laterality: Right;  . APPENDECTOMY    . CATARACT EXTRACTION  10/24/2007   right  . CATARACT EXTRACTION  11/07/2007   left  . CORONARY ANGIOPLASTY WITH STENT PLACEMENT    . TONSILLECTOMY      History  Smoking Status  . Former Smoker  . Types: Pipe  Smokeless Tobacco  . Never Used    Comment: Quit > 23 yrs ago.    History  Alcohol Use  . 10.5 oz/week  . 21 Standard drinks or equivalent per week    Social History   Social History  . Marital status: Single    Spouse name: N/A  . Number of children: N/A  . Years of education: N/A   Occupational History  . Retired Retired   Social History Main Topics  . Smoking status: Former Smoker    Types: Pipe  . Smokeless tobacco: Never Used     Comment: Quit > 23 yrs ago.  . Alcohol use 10.5 oz/week    21 Standard drinks or equivalent per week  . Drug use: No  . Sexual activity: No   Other Topics Concern  . 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 [facility] [place]. Married 2047/05/28- 29- divorced, married 05-28-1978- 9 years-divorce, married 05/28/90-  10-divorce. Had a companion who died.1 son - Apr 27, 2056 dtrs - 2051-05-28, May 28, 2058, '62, '64; 6 grandchildren. 7 great-grands. 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 ACP - he thinks he has a living will. For now he wishes to have CPR, short-term mechanical ventilation  POA dtr - Mahalia Longest  (c) [phone]    Allergies  Allergen Reactions  . Grapefruit Concentrate Other (See Comments)    Cannot take with  several of his medications  . Amoxicillin Rash  . Penicillins Rash    Has patient had a PCN reaction causing immediate rash, facial/tongue/throat swelling, SOB or lightheadedness with hypotension: Yes Has patient had a PCN reaction causing severe rash involving  mucus membranes or skin necrosis: Unknown Has patient had a PCN reaction that required hospitalization: Unknown Has patient had a PCN reaction occurring within the last 10 years: No If all of the above answers are "NO", then may proceed with Cephalosporin use.     Current Facility-Administered Medications  Medication Dose Route Frequency Provider Last Rate Last Dose  . 0.9 %  sodium chloride infusion   Intravenous Continuous Danford, Earl Liteshristopher P, MD      . acetaminophen (TYLENOL) tablet 650 mg  650 mg Oral Q6H PRN Danford, Earl Liteshristopher P, MD       Or  . acetaminophen (TYLENOL) suppository 650 mg  650 mg Rectal Q6H PRN Danford, Earl Liteshristopher P, MD      . ceFEPIme (MAXIPIME) 2 g in dextrose 5 % 50 mL IVPB  2 g Intravenous Q24H Rolly SalterPatel, Pranav M, MD      . metoprolol tartrate (LOPRESSOR) injection 5 mg  5 mg Intravenous Q6H Rolly SalterPatel, Pranav M, MD      . ondansetron [facility](ZOFRAN) tablet 4 mg  4 mg Oral Q6H PRN Danford, Earl Liteshristopher P, MD       Or  . ondansetron (ZOFRAN) injection 4 mg  4 mg Intravenous Q6H PRN Danford, Earl Liteshristopher P, MD      . sodium chloride flush (NS) 0.9 % injection 3 mL  3 mL Intravenous Q12H Danford, Earl Liteshristopher P, MD      . vancomycin (VANCOCIN) IVPB 750 mg/150 ml premix  750 mg Intravenous Q24H Stevphen RochesterLedford, James L, RPH       Facility-Administered Medications Ordered in Other Encounters  Medication Dose Route Frequency Provider Last Rate Last Dose  . lidocaine (PF) (XYLOCAINE) 1 % injection             Prescriptions Prior to Admission  Medication Sig Dispense Refill Last Dose  . acetaminophen (TYLENOL) 500 MG chewable tablet Chew 500 mg by mouth every 6 (six) hours as needed for pain.   07/21/2016 at Unknown time  .  diphenhydrAMINE (BENADRYL) 50 MG tablet Take 50 mg by mouth at bedtime as needed for itching.   Past Week at Unknown time  . FIBER PO Take 1 capsule by mouth daily as needed (constipation).   Past Month at Unknown time  . rivaroxaban (XARELTO) 20 MG TABS tablet Take 1 tablet (20 mg total) by mouth daily with breakfast. (Patient not taking: Reported on 08/02/2016) 30 tablet 0 Not Taking at Unknown time    Family History  Problem Relation Age of Onset  . Heart disease Father 1178  . Hypertension Mother   . Dementia Sister   . Colon cancer Neg Hx      Review of Systems:  Pertinent items are noted in HPI.     Cardiac Review of Systems: Y or N  Chest Pain [    ]  Resting SOB [ Y  ] Exertional SOB  [  ]  Orthopnea [  ]   Pedal Edema [ N  ]    Palpitations [  ] Syncope  [  ]   Presyncope [   ]  General Review of Systems: [Y] = yes [  ]=no Constitional: recent weight change [  ]; anorexia [ Y ]; fatigue [ Y ]; nausea [  ]; night sweats [  ]; fever [  ]; or chills [  ]  Dental: poor dentition[  ]; Last Dentist visit:   Eye : blurred vision [  ]; diplopia [   ]; vision changes [  ];  Amaurosis fugax[  ]; Resp: cough [Y  ];  wheezing[ N ];  hemoptysis[  ]; shortness of breath[  ]; paroxysmal nocturnal dyspnea[  ]; dyspnea on exertion[  ]; or orthopnea[  ];  GI:  gallstones[  ], vomiting[ N ];  dysphagia[  ]; melena[  ];  hematochezia [  ]; heartburn[  ];   Hx of  Colonoscopy[  ]; GU: kidney stones [  ]; hematuria[  ];   dysuria [  ];  nocturia[  ];  history of     obstruction [Y  ]; urinary frequency [  ]             Skin: rash, swelling[  ];, hair loss[  ];  peripheral edema[ N ];  or itching[  ]; Musculosketetal: myalgias[  ];  joint swelling[  ];  joint erythema[  ];  joint pain[  ];  back pain[  ];  Heme/Lymph: bruising[Y  ];  bleeding[  ];  anemia[  ];  Neuro: TIA[ Y ];  headaches[  ];  stroke[N  ];  vertigo[  ];  seizures[  ];    paresthesias[  ];  difficulty walking[  ];  Psych:depression[Y  ]; anxiety[ N ];  Endocrine: diabetes[N  ];  thyroid dysfunction[  ];  Immunizations: Flu [  ]; Pneumococcal[  ];  Other:  Physical Exam: BP 113/84   Pulse (!) 150   Temp 99.8 F (37.7 C) (Rectal)   Resp (!) 24   Wt 81.6 kg (180 lb)   SpO2 96%   BMI 25.10 kg/m    General appearance: cooperative and no distress Resp: Diminished left lower lobe Cardio: sinus tachycardia GI: soft, non-tender; bowel sounds normal; no masses,  no organomegaly Extremities: venous stasis dermatitis noted on the left, no edema Neurologic: Confused, HOH  Diagnostic Studies & Laboratory data:     Recent Radiology Findings:   Dg Chest 1 View  Result Date: 07/16/2016 CLINICAL DATA:  Status post left thoracentesis EXAM: CHEST 1 VIEW COMPARISON:  Chest radiograph from earlier today. FINDINGS: Stable cardiomediastinal silhouette with normal heart size. There is a moderate to large lateral basilar left hydropneumothorax with significant reduction in the pleural fluid component status post thoracentesis. No right pneumothorax. Slight right mediastinal shift is not definitely changed accounting for slight rightward rotation. Stable volume loss and compressive atelectasis throughout the mid to lower left lung. No pulmonary edema. IMPRESSION: 1. Moderate to large lateral basilar left hydropneumothorax, with replacement of pleural fluid by pleural air status post thoracentesis. 2. Stable volume loss and compressive atelectasis throughout the mid to lower left lung, suggesting trapped lung. 3. Stable slight right mediastinal shift. Critical Value/emergent results were called by telephone at the time of interpretation on 08/01/2016 at 1:08 pm to Dr. Allena Katz, who verbally acknowledged these results. Electronically Signed   By: Delbert Phenix M.D.   On: 07/13/2016 13:13   Ct Chest Wo Contrast  Result Date: 07/18/2016 CLINICAL DATA:  83 year old male with  opacification of the left hemithorax. EXAM: CT CHEST WITHOUT CONTRAST TECHNIQUE: Multidetector CT imaging of the chest was performed following the standard protocol without IV contrast. COMPARISON:  Chest radiograph dated 07/24/2016 FINDINGS: Evaluation of this exam is limited in the absence of intravenous contrast. Evaluation is also limited due to streak artifact caused by patient's arms. Cardiovascular: There is  no cardiomegaly or pericardial effusion. Multi vessel coronary vascular calcifications primarily involving the LAD, RCA, and left circumflex artery. There is moderate atherosclerotic calcification of thoracic aorta. There is mild dilatation of the aortic arch measuring approximately 4 cm a diameter. Evaluation of the aorta is limited in the absence of intravenous contrast. There is enlargement of the main pulmonary trunk indicative of underlying pulmonary hypertension. Mediastinum/Nodes: There is no hilar or mediastinal adenopathy. Evaluation of the left hilum is limited due to atelectatic changes of the adjacent lung and noncontrast nature of the study. There is mild shift of the mediastinum to the right hemithorax. The esophagus and the thyroid gland are grossly unremarkable. Lungs/Pleura: There is a small right pleural effusion. Large multiloculated appearing left pleural effusion occupying the majority of the left hemithorax. There is associated compressive atelectasis of the left lung with small area of aerated lung in the left upper lobe. Patchy area of ground-glass and hazy density in the left upper lobe likely atelectatic changes although infiltrate is not excluded. Right lung base atelectatic changes versus infiltrate. There is no pneumothorax. There is compression of the left upper and left lower lobe bronchi. Upper Abdomen: There is colonic diverticulosis. Musculoskeletal: Diffuse subcutaneous edema and anasarca. Degenerative changes of the spine. There is compression fracture of the L1  vertebra with approximately 50% loss of vertebral body height and anterior wedging, seen on the radiograph of 12/10/2015. No definite acute fracture. IMPRESSION: 1. Large multiloculated left pleural effusion occupying the majority of the left hemithorax. There is associated mass effect and compressive atelectasis of the majority of the left lung with a small aerated portion in the left upper lobe. 2. Compression of the left upper and left lower lobe bronchi secondary to mass effect caused by loculated left pleural effusion. There is also mild shift of the mediastinum to the right. 3. Small right pleural effusion with right lung base atelectasis versus infiltrate. 4. Multi vessel coronary vascular disease. 5.  Aortic Atherosclerosis (ICD10-I70.0). 6. Mild dilatation of the aortic arch measuring up to 4 cm. 7. Enlarged pulmonary arteries indicative of underlying pulmonary hypertension. 8. Chronic appearing L1 compression fracture. Correlation with clinical exam and point tenderness recommended. Electronically Signed   By: Elgie Collard M.D.   On: Aug 21, 2016 06:06   Dg Chest Port 1 View  Result Date: 08/21/16 CLINICAL DATA:  83 year old male with shortness of breath and altered mental status. EXAM: PORTABLE CHEST 1 VIEW COMPARISON:  Chest radiograph dated 05/29/2016 FINDINGS: There is near complete opacification of the left hemithorax with only a very small aerated portion of the lung in the upper lobes. This findings is new compared to the prior radiograph. Right lung base atelectatic changes with probable small right pleural effusion. There is no pneumothorax. The left cardiac borders are silhouetted. There is atherosclerotic calcification of the aortic arch. There is no mediastinal shift. No acute osseous pathology. IMPRESSION: Near complete opacification of the left hemithorax, new from prior radiograph likely representing enlarged pleural effusion and associated compressive atelectasis of the lung.  Superimposed pneumonia or a central/ perihilar mass is not entirely excluded. Clinical correlation and follow-up to resolution recommended. Probable trace right pleural effusion and minimal right lung base atelectatic changes. Electronically Signed   By: Elgie Collard M.D.   On: August 21, 2016 03:33     I have independently reviewed the above radiologic studies.  Recent Lab Findings: Lab Results  Component Value Date   WBC 6.5 21-Aug-2016   HGB 13.2 21-Aug-2016   HCT 43.3  2016-07-28   PLT 177 July 28, 2016   GLUCOSE 122 (H) July 28, 2016   CHOL 139 04/30/2014   TRIG 102.0 04/30/2014   HDL 44.90 04/30/2014   LDLCALC 74 04/30/2014   ALT 15 (L) 2016/07/28   AST 28 07/28/16   NA 140 07/28/2016   K 5.1 2016/07/28   CL 106 28-Jul-2016   CREATININE 1.75 (H) 2016/07/28   BUN 53 (H) 07/28/2016   CO2 21 (L) 2016-07-28   TSH 1.621 08/23/2015   INR 1.47 07/28/16   HGBA1C 5.5 08/24/2015      Assessment / Plan:   Discussed options with his caregiver, Gerarda Gunther over the phone. She is a POA along with the patient's daughter. She shared that she wanted to prevent suffering and to keep the patient comfortable. The patient is listed as a DNR. She also shared that she wanted surgical/medical decisions to be discussed with his daughter who is an Charity fundraiser. I feel a palliative consult is necessary to discuss goals of care.   Donzetta Kohut, PA  I have seen patient and reviewed cts. And history   Patient is not candidate for thoracic surgery, likely trapped lung left now s/p thoracentesis. Can consider placement of ir directed chest tube , but unlikly lung will inflate or just observation and comfort care.  Delight Ovens MD      [address] [place] [place] [address] Office [phone]   Beeper [phone]  07-28-16 1:57 PM

## 2016-07-22 NOTE — Progress Notes (Signed)
[facility] Progress Note  Patient: Jeff Hunt ZOX:[id number]   PCP: Jeff Massed, MD DOB: May 30, 1925   DOA: 07/08/2016   DOS: 07/26/2016   Date of Service: the patient was seen and examined on 08/02/2016  Subjective: Admitted last night after 12 midnight. CCM was also consulted. On my initial evaluation the patient was responsive answering questions appropriately although orientation was difficult to discern due to hard of hearing. Later on the patient was lethargic and minimally responsive improved with removing the BiPAP. Complains about back pain. Also had a phone conversation with patient's surrogate daughter, Jeff Hunt as well as patient's daughter Jeff Bible, who both are the power of attorney regarding goals of care. They both agree that the patient has a living will that states that the patient wants a natural death, and would not want the patient to bring back. Based on this they both agreed to change the course status from full code. DO NOT RESUSCITATE. At present based on my discussion, they would like to treat what is treatable. We did not have any information during this conversation regarding the plan for his loculated effusion with hydropneumothorax on the left.  Assessment and Plan: 1. Sepsis due to left pleural effusion, pneumonia as well as empyema. Critical care consulted, IR guided thoracentesis performed which was showing frank pus. Postprocedure the patient has left-sided hydropneumothorax and which basically all the fluid that was removed is replaced by air. 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 A cardiac thoracic surgery is currently consulted awaiting recommendation. Continue with broad-spectrum IV antibiotics.  2. Dysphagia. Depression. Dementia with agitation.  At his baseline the patient has difficulty swallowing or family. Recently as per mentioned by Jeff Bible, the patient actually vocalized that he would try to kill himself by  aspirating. Given this history currently we'll keep the patient nothing by mouth and request a bedside swallowing eval. Also with his lethargy the patient should be on aspiration precaution. Patient does have history of depression. It is difficult to discern whether the patient was suicidal or not as it happened in the past. Currently the patient is unable to provide any history. With this dementia patient will require when necessary Haldol to control his agitation. Continue to monitor in the step down unit.  3. Atrial flutter with 2:1 block. We will use scheduled Lopressor. Holding parameters as the patient has sepsis as well. Continue IV hydration. Cardiology consulted for further input. Reportedly the patient is on Xarelto at home unable to verify this information at present.  Advance goals of care discussion: DNR/DNI. Palliative care consulted based on the request of the cardiothoracic surgery.  Family Communication: Discussed with patient's daughter on the phone. Opportunity was given to ask question and all questions were answered satisfactorily.   Disposition:  Discharge to be determined. .  Consultants: Cardiology, cardiothoracic surgery, critical care medicine, palliative care Procedures: none  Antibiotics: Anti-infectives    Start     Dose/Rate Route Frequency Ordered Stop   08/03/2016 2300  ceFEPIme (MAXIPIME) 2 g in dextrose 5 % 50 mL IVPB  Status:  Discontinued     2 g 100 mL/hr over 30 Minutes Intravenous Every 24 hours 07/26/2016 0436 07/21/2016 1339   07/16/2016 2300  ceFEPIme (MAXIPIME) 2 g in dextrose 5 % 50 mL IVPB     2 g 100 mL/hr over 30 Minutes Intravenous Every 24 hours 07/18/2016 1339     07/21/2016 2200  vancomycin (VANCOCIN) IVPB 750 mg/150 ml premix  750 mg 150 mL/hr over 60 Minutes Intravenous Every 24 hours Aug 08, 2016 0436     08/08/16 0400  ceFEPIme (MAXIPIME) 2 g in dextrose 5 % 50 mL IVPB     2 g 100 mL/hr over 30 Minutes Intravenous  Once 08-08-16 0358  August 08, 2016 0620   Aug 08, 2016 0400  vancomycin (VANCOCIN) IVPB 1000 mg/200 mL premix     1,000 mg 200 mL/hr over 60 Minutes Intravenous  Once 08-Aug-2016 0358 08-Aug-2016 0621       Objective: Physical Exam: Vitals:   08/08/16 1306 08-08-2016 1333 08-08-2016 1400 08/08/16 1500  BP: 107/83  100/86 105/85  Pulse:      Resp: (!) 21  (!) 21 20  Temp: (!) 97.4 F (36.3 C)     TempSrc: Oral     SpO2:  96% 100% 100%  Weight:        Intake/Output Summary (Last 24 hours) at Aug 08, 2016 1622 Last data filed at Aug 08, 2016 1441  Gross per 24 hour  Intake             3653 ml  Output                0 ml  Net             3653 ml   Filed Weights   Aug 08, 2016 0355  Weight: 81.6 kg (180 lb)   General: Alert, Awake and Oriented to Person. Appear in marked distress, affect difficult to assess Eyes: PERRL Conjunctiva normal ENT: Oral Mucosa clear dry. Neck: difficult to assess JVD, no Abnormal Mass Or lumps Cardiovascular: S1 and S2 Present, aortic systolic Murmur, Peripheral Pulses Present Respiratory: increased respiratory effort, Bilateral Air entry equal and Decreased, postive use of accessory muscle, left Crackles, no wheezes Abdomen: Bowel Sound present, Soft and no tenderness, no hernia Skin: no redness, no Rash, no induration Extremities: no Pedal edema, no calf tenderness Neurologic: Grossly no focal neuro deficit. Bilaterally Equal motor strength  Data Reviewed: CBC:  Recent Labs Lab 08/08/2016 0324  WBC 6.5  NEUTROABS 5.0  HGB 13.2  HCT 43.3  MCV 89.6  PLT 177   Basic Metabolic Panel:  Recent Labs Lab 2016-08-08 0324  NA 140  K 5.1  CL 106  CO2 21*  GLUCOSE 122*  BUN 53*  CREATININE 1.75*  CALCIUM 8.2*    Liver Function Tests:  Recent Labs Lab August 08, 2016 0324  AST 28  ALT 15*  ALKPHOS 91  BILITOT 1.5*  PROT 6.1*  ALBUMIN 2.1*   No results for input(s): LIPASE, AMYLASE in the last 168 hours. No results for input(s): AMMONIA in the last 168 hours. Coagulation  Profile:  Recent Labs Lab 2016/08/08 0324  INR 1.47   Cardiac Enzymes: No results for input(s): CKTOTAL, CKMB, CKMBINDEX, TROPONINI in the last 168 hours. BNP (last 3 results) No results for input(s): PROBNP in the last 8760 hours. CBG: No results for input(s): GLUCAP in the last 168 hours. Studies: Dg Chest 1 View  Result Date: 08-08-16 CLINICAL DATA:  Status post left thoracentesis EXAM: CHEST 1 VIEW COMPARISON:  Chest radiograph from earlier today. FINDINGS: Stable cardiomediastinal silhouette with normal heart size. There is a moderate to large lateral basilar left hydropneumothorax with significant reduction in the pleural fluid component status post thoracentesis. No right pneumothorax. Slight right mediastinal shift is not definitely changed accounting for slight rightward rotation. Stable volume loss and compressive atelectasis throughout the mid to lower left lung. No pulmonary edema. IMPRESSION: 1. Moderate to large lateral basilar  left hydropneumothorax, with replacement of pleural fluid by pleural air status post thoracentesis. 2. Stable volume loss and compressive atelectasis throughout the mid to lower left lung, suggesting trapped lung. 3. Stable slight right mediastinal shift. Critical Value/emergent results were called by telephone at the time of interpretation on 20-Jan-2016 at 1:08 pm to Dr. Allena KatzPATEL, who verbally acknowledged these results. Electronically Signed   By: Delbert PhenixJason A Poff M.D.   On: 015-Jan-2018 13:13   Ct Chest Wo Contrast  Result Date: 20-Jan-2016 CLINICAL DATA:  83 year old male with opacification of the left hemithorax. EXAM: CT CHEST WITHOUT CONTRAST TECHNIQUE: Multidetector CT imaging of the chest was performed following the standard protocol without IV contrast. COMPARISON:  Chest radiograph dated 015-Jan-2018 FINDINGS: Evaluation of this exam is limited in the absence of intravenous contrast. Evaluation is also limited due to streak artifact caused by patient's arms.  Cardiovascular: There is no cardiomegaly or pericardial effusion. Multi vessel coronary vascular calcifications primarily involving the LAD, RCA, and left circumflex artery. There is moderate atherosclerotic calcification of thoracic aorta. There is mild dilatation of the aortic arch measuring approximately 4 cm a diameter. Evaluation of the aorta is limited in the absence of intravenous contrast. There is enlargement of the main pulmonary trunk indicative of underlying pulmonary hypertension. Mediastinum/Nodes: There is no hilar or mediastinal adenopathy. Evaluation of the left hilum is limited due to atelectatic changes of the adjacent lung and noncontrast nature of the study. There is mild shift of the mediastinum to the right hemithorax. The esophagus and the thyroid gland are grossly unremarkable. Lungs/Pleura: There is a small right pleural effusion. Large multiloculated appearing left pleural effusion occupying the majority of the left hemithorax. There is associated compressive atelectasis of the left lung with small area of aerated lung in the left upper lobe. Patchy area of ground-glass and hazy density in the left upper lobe likely atelectatic changes although infiltrate is not excluded. Right lung base atelectatic changes versus infiltrate. There is no pneumothorax. There is compression of the left upper and left lower lobe bronchi. Upper Abdomen: There is colonic diverticulosis. Musculoskeletal: Diffuse subcutaneous edema and anasarca. Degenerative changes of the spine. There is compression fracture of the L1 vertebra with approximately 50% loss of vertebral body height and anterior wedging, seen on the radiograph of 12/10/2015. No definite acute fracture. IMPRESSION: 1. Large multiloculated left pleural effusion occupying the majority of the left hemithorax. There is associated mass effect and compressive atelectasis of the majority of the left lung with a small aerated portion in the left upper lobe.  2. Compression of the left upper and left lower lobe bronchi secondary to mass effect caused by loculated left pleural effusion. There is also mild shift of the mediastinum to the right. 3. Small right pleural effusion with right lung base atelectasis versus infiltrate. 4. Multi vessel coronary vascular disease. 5.  Aortic Atherosclerosis (ICD10-I70.0). 6. Mild dilatation of the aortic arch measuring up to 4 cm. 7. Enlarged pulmonary arteries indicative of underlying pulmonary hypertension. 8. Chronic appearing L1 compression fracture. Correlation with clinical exam and point tenderness recommended. Electronically Signed   By: Elgie CollardArash  Radparvar M.D.   On: 015-Jan-2018 06:06   Dg Chest Port 1 View  Result Date: 20-Jan-2016 CLINICAL DATA:  83 year old male with shortness of breath and altered mental status. EXAM: PORTABLE CHEST 1 VIEW COMPARISON:  Chest radiograph dated 05/29/2016 FINDINGS: There is near complete opacification of the left hemithorax with only a very small aerated portion of the lung in the upper lobes.  This findings is new compared to the prior radiograph. Right lung base atelectatic changes with probable small right pleural effusion. There is no pneumothorax. The left cardiac borders are silhouetted. There is atherosclerotic calcification of the aortic arch. There is no mediastinal shift. No acute osseous pathology. IMPRESSION: Near complete opacification of the left hemithorax, new from prior radiograph likely representing enlarged pleural effusion and associated compressive atelectasis of the lung. Superimposed pneumonia or a central/ perihilar mass is not entirely excluded. Clinical correlation and follow-up to resolution recommended. Probable trace right pleural effusion and minimal right lung base atelectatic changes. Electronically Signed   By: Elgie Collard M.D.   On: 26-Jul-2016 03:33   Ir Thoracentesis Asp Pleural Space W/img Guide  Result Date: 07/26/16 INDICATION: Weakness, cough.  Left pleural effusion. Request for diagnostic and therapeutic thoracentesis. EXAM: ULTRASOUND GUIDED LEFT THORACENTESIS MEDICATIONS: 1% Lidocaine = 10 mL COMPLICATIONS: None immediate. PROCEDURE: An ultrasound guided thoracentesis was thoroughly discussed with the patient and questions answered. The benefits, risks, alternatives and complications were also discussed. The patient understands and wishes to proceed with the procedure. Written consent was obtained. Ultrasound was performed to localize and mark an adequate pocket of fluid in the left chest. The area was then prepped and draped in the normal sterile fashion. 1% Lidocaine was used for local anesthesia. Under ultrasound guidance a 6 Fr Safe-T-Centesis catheter was introduced. Thoracentesis was performed. The catheter was removed and a dressing applied. FINDINGS: A total of approximately 400 mL of thick green purulent fluid was removed. Samples were sent to the laboratory as requested by the clinical team. IMPRESSION: Successful ultrasound guided left thoracentesis yielding 400 mL of pleural fluid. Post procedure chest X-ray revealed moderate to large lateral basilar left hydropneumothorax, with replacement of pleural fluid by pleural air. Cardiothoracic surgery has been consulted. Read by:  Corrin Parker, PA-C Electronically Signed   By: Judie Petit.  Shick M.D.   On: 07/26/16 15:09    Scheduled Meds: . metoprolol tartrate  5 mg Intravenous Q6H  . sodium chloride flush  3 mL Intravenous Q12H   Continuous Infusions: . sodium chloride 100 mL/hr (07-26-16 1440)  . ceFEPime (MAXIPIME) IV    . vancomycin     PRN Meds: acetaminophen **OR** acetaminophen, ondansetron **OR** ondansetron (ZOFRAN) IV  Time spent: Initially seen within 11 AM to 11:20 AM. Repeat evaluation as well as discussion with the RN, cardiothoracic surgery, cardiology, patient's daughter between 1:30 PM and 2:05 PM. Rest of the time spent in documentation.  Author: Lynden Oxford, MD [facility] Pager: [phone] 2016-07-26 4:22 PM  If 7PM-7AM, please contact night-coverage at [email or web address], password [email or web address]

## 2016-07-22 NOTE — H&P (Signed)
History and Physical  Patient Name: Jeff Hunt     ZOX:[id number]    DOB: [birth year]    DOA: 07/12/2016 PCP: [name], MD  Patient coming from: Home  Chief Complaint: Weakness      HPI: Jeff Hunt is a 83 y.o. male with a past medical history significant for Afib on Xarelto, HTN and deafness who presents with weakness for few days.  Caveat that patient is unable to provide history because of deafness, inarticulate.  Per report, the patient was brought in by caregiver because of progressive weakness over several days or weeks, now so severe that the caregiver could not transfer the patient had all. There is also report of coughing and choking.  ED course: -Afebrile rectally, heart rate 152, respirations 30, initial BP 99/78, pulse ox normal on room air, BP improved with fluids -Na 140, K 5.1, Cr 1.75 (baseline 0.9), WBC 6.5K, Hgb 13.2 -INR 1.47 -Lactate 4.8 -Chest x-ray showed complete opacification of the left lung -CT of the chest showed loculated left-sided effusion, nearly entire lung -ECG showed atrial flutter, rate 152 -He was given vancomycin and cefepime, placed on BiPAP, and given 30 mL/kg IV fluids -He was evaluated by critical care who recommended hospitalists admission to stepdown, CVTS consult for VATS     ROS: Review of Systems  Unable to perform ROS: Acuity of condition          Past Medical History:  Diagnosis Date  . Arthropathy, unspecified, site unspecified   . BPH with obstruction/lower urinary tract symptoms   . Cataract    cortical senile  . Cerebrovascular disease, unspecified   . Chronic renal insufficiency, stage II (mild)   . Chronic rhinitis   . Chronic venous insufficiency   . Coronary atherosclerosis of unspecified type of vessel, native or graft    s/p stent placement "decades ago", hx of cath after this which showed patent stent per pt report.  Marland Kitchen DEPRESSION 12/06/2006   Qualifier: Diagnosis of  By: Roxan Hockey CMA,  Shanda Bumps    . Depressive disorder, not elsewhere classified   . Hearing loss of both ears   . History of actinic keratoses 08/2011  . History of iron deficiency anemia 2013   Pt declined GI eval on multiple occasions  . Lichen simplex chronicus 08/2011  . Lumbar degenerative disc disease 06/2009   Mild osteoarthritic changes of L-spine diffusely on x-rays  . Memory loss   . Osteoarthritis 06/2009   X-rays: both hips-mild  . Other and unspecified hyperlipidemia   . Rectal bleeding 2015   on/off x 3 mo; saw Dr. Dulce Sellar with [facility]; CT abd/pelvis + Anusol HC supp regimen rx'd.  It appears the CT was ordered but not done (as of 02/20/14)  . TRANSIENT ISCHEMIC ATTACK, HX OF 12/06/2006   Qualifier: Diagnosis of  By: Roxan Hockey CMA, Shanda Bumps    . Urticarial dermatitis    Skin biopsy 11/2010 favored "urticarial allergic rxn related to localized hypersensitivity such as insect bite or arthropod assault"  . Venous stasis dermatitis, left 07/2007   Biopsy showed angiodermatitis, with all tests for infection NEG (fungal stain, AFB stain, and gram stain).    Past Surgical History:  Procedure Laterality Date  . ANTERIOR APPROACH HEMI HIP ARTHROPLASTY Right 08/24/2015   Procedure: ANTERIOR APPROACH HEMI HIP ARTHROPLASTY;  Surgeon: Cammy Copa, MD;  Location: [facility] OR;  Service: Orthopedics;  Laterality: Right;  . APPENDECTOMY    . CATARACT EXTRACTION  10/24/2007   right  .  CATARACT EXTRACTION  11/07/2007   left  . CORONARY ANGIOPLASTY WITH STENT PLACEMENT    . TONSILLECTOMY      Social History:  Social History   Social History  . Marital status: Single    Spouse name: N/A  . Number of children: N/A  . Years of education: N/A   Occupational History  . Retired Retired   Social History Main Topics  . Smoking status: Former Smoker    Types: Pipe  . Smokeless tobacco: Never Used     Comment: Quit > 23 yrs ago.  . Alcohol use 10.5 oz/week    21 Standard drinks or equivalent per week  .  Drug use: No  . Sexual activity: No   Other Topics Concern  . 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 [facility] [place]. Married 2047-05-27- 29- divorced, married 27-May-1978- 9 years-divorce, married 1990/05/27-  10-divorce. Had a companion who died.1 son - 04/26/2056 dtrs - May 27, 2051, 05-27-58, '62, '64; 6 grandchildren. 7 great-grands. 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 ACP - he thinks he has a living will. For now he wishes to have CPR, short-term mechanical ventilation  POA dtr - Mahalia Longest  (c) [phone]     Allergies  Allergen Reactions  . Grapefruit Concentrate Other (See Comments)    Cannot take with several of his medications  . Amoxicillin Rash  . Penicillins Rash    Has patient had a PCN reaction causing immediate rash, facial/tongue/throat swelling, SOB or lightheadedness with hypotension: Yes Has patient had a PCN reaction causing severe rash involving mucus membranes or skin necrosis: Unknown Has patient had a PCN reaction that required hospitalization: Unknown Has patient had a PCN reaction occurring within the last 10 years: No If all of the above answers are "NO", then may proceed with Cephalosporin use.     Family history: family history includes Dementia in his sister; Heart disease (age of onset: 34) in his father; Hypertension in his mother.  Prior to Admission medications   Medication Sig Start Date End Date Taking? Authorizing Provider  acetaZOLAMIDE (DIAMOX) 250 MG tablet ALTERNATE 2 TABLETS BY MOUTH EVERY DAY WITH 1 TABLET BY MOUTH EVERY DAY. 2014/05/27   McGowen, Maryjean Morn, MD  citalopram (CELEXA) 20 MG tablet Take 20 mg by mouth daily. 08/16/15   [provider]  ferrous sulfate 325 (65 FE) MG tablet Take 325 mg by mouth 2 (two) times daily.    [provider]  fluticasone (FLONASE) 50 MCG/ACT nasal spray INSTILL 2 SPRAYS INTO EACH NOSTRIL DAILY 01/11/14   McGowen, Maryjean Morn, MD    HYDROcodone-acetaminophen (NORCO/VICODIN) 5-325 MG tablet Take 1 tablet by mouth every 6 (six) hours as needed for moderate pain or severe pain. 08/29/15   Cammy Copa, MD  metoprolol tartrate (LOPRESSOR) 25 MG tablet Take 1 tablet (25 mg total) by mouth 2 (two) times daily. 08/30/15   Renae Fickle, MD  pantoprazole (PROTONIX) 40 MG tablet Take 1 tablet (40 mg total) by mouth daily. 04/30/14   McGowen, Maryjean Morn, MD  polycarbophil (FIBERCON) 625 MG tablet Take 625 mg by mouth daily.    [provider]  Potassium Gluconate 550 MG TABS Take 1 tablet by mouth daily.    [provider]  rivaroxaban (XARELTO) 20 MG TABS tablet Take 1 tablet (20 mg total) by mouth daily with breakfast. 08/30/15  Renae FickleShort, Mackenzie, MD  simvastatin (ZOCOR) 80 MG tablet TAKE 1 TABLET BY MOUTH AT BEDTIME 06/13/14   McGowen, Maryjean MornPhilip H, MD  tamsulosin (FLOMAX) 0.4 MG CAPS capsule Take 1 capsule (0.4 mg total) by mouth daily. 04/16/14   McGowen, Maryjean MornPhilip H, MD  triamcinolone (KENALOG) 0.025 % cream Apply 2-3 times daily to skin eruptions, hives    [provider]       Physical Exam: BP (!) 114/91   Pulse (!) 147   Temp 99.8 F (37.7 C) (Rectal)   Resp (!) 23   Wt 81.6 kg (180 lb)   SpO2 95%   BMI 25.10 kg/m  General appearance: Thin elderly adult male, awake and responds to tactile stimuli, appears weak, does not speak clearly, somewhat inattentive. Eyes: Anicteric, conjunctiva pink, lids and lashes normal. PERRL.    ENT: No nasal deformity, discharge, epistaxis.  Hearing deaf. OP very dry. Neck: No neck masses.  Trachea midline.  No thyromegaly/tenderness. Lymph: No cervical or supraclavicular lymphadenopathy. Skin: Hot, not diaphoretic.  Pale.  No jaundice.  No suspicious rashes or lesions. Cardiac: Tachycardic, nl S1-S2, no murmurs appreciated.  Capillary refill is slow normal.  JVP not visible.  No LE edema.  Radial pulses 2+ and symmetric. Respiratory: Tachpyneic, shallow.   Labored.  No respiratory sounds on left. Abdomen: Abdomen soft.  No TTP. No ascites, distension, hepatosplenomegaly.   MSK: No deformities or effusions.  No cyanosis or clubbing. Neuro: Awake, unable to test CNs due to hearing, confusion.  Appears to move both upper extremities.  Face appears symmetric.  EOM appear grossly normal.  Psych: Unable to assess.     Labs on Admission:  I have personally reviewed following labs and imaging studies: CBC:  Recent Labs Lab 07/05/2016 0324  WBC 6.5  NEUTROABS 5.0  HGB 13.2  HCT 43.3  MCV 89.6  PLT 177   Basic Metabolic Panel:  Recent Labs Lab 07/30/2016 0324  NA 140  K 5.1  CL 106  CO2 21*  GLUCOSE 122*  BUN 53*  CREATININE 1.75*  CALCIUM 8.2*   GFR: CrCl cannot be calculated (Unknown ideal weight.).  Liver Function Tests:  Recent Labs Lab 08/02/2016 0324  AST 28  ALT 15*  ALKPHOS 91  BILITOT 1.5*  PROT 6.1*  ALBUMIN 2.1*   Coagulation Profile:  Recent Labs Lab 07/29/2016 0324  INR 1.47   Sepsis Labs: Lactic acid 4.8          Radiological Exams on Admission: Personally reviewed CXR shows opacification of left hemithorax; CT report reviewed: Ct Chest Wo Contrast  Result Date: 08/02/2016 CLINICAL DATA:  83 year old male with opacification of the left hemithorax. EXAM: CT CHEST WITHOUT CONTRAST TECHNIQUE: Multidetector CT imaging of the chest was performed following the standard protocol without IV contrast. COMPARISON:  Chest radiograph dated 07/08/2016 FINDINGS: Evaluation of this exam is limited in the absence of intravenous contrast. Evaluation is also limited due to streak artifact caused by patient's arms. Cardiovascular: There is no cardiomegaly or pericardial effusion. Multi vessel coronary vascular calcifications primarily involving the LAD, RCA, and left circumflex artery. There is moderate atherosclerotic calcification of thoracic aorta. There is mild dilatation of the aortic arch measuring approximately 4 cm  a diameter. Evaluation of the aorta is limited in the absence of intravenous contrast. There is enlargement of the main pulmonary trunk indicative of underlying pulmonary hypertension. Mediastinum/Nodes: There is no hilar or mediastinal adenopathy. Evaluation of the left hilum is limited due to atelectatic changes of the  adjacent lung and noncontrast nature of the study. There is mild shift of the mediastinum to the right hemithorax. The esophagus and the thyroid gland are grossly unremarkable. Lungs/Pleura: There is a small right pleural effusion. Large multiloculated appearing left pleural effusion occupying the majority of the left hemithorax. There is associated compressive atelectasis of the left lung with small area of aerated lung in the left upper lobe. Patchy area of ground-glass and hazy density in the left upper lobe likely atelectatic changes although infiltrate is not excluded. Right lung base atelectatic changes versus infiltrate. There is no pneumothorax. There is compression of the left upper and left lower lobe bronchi. Upper Abdomen: There is colonic diverticulosis. Musculoskeletal: Diffuse subcutaneous edema and anasarca. Degenerative changes of the spine. There is compression fracture of the L1 vertebra with approximately 50% loss of vertebral body height and anterior wedging, seen on the radiograph of 12/10/2015. No definite acute fracture. IMPRESSION: 1. Large multiloculated left pleural effusion occupying the majority of the left hemithorax. There is associated mass effect and compressive atelectasis of the majority of the left lung with a small aerated portion in the left upper lobe. 2. Compression of the left upper and left lower lobe bronchi secondary to mass effect caused by loculated left pleural effusion. There is also mild shift of the mediastinum to the right. 3. Small right pleural effusion with right lung base atelectasis versus infiltrate. 4. Multi vessel coronary vascular disease.  5.  Aortic Atherosclerosis (ICD10-I70.0). 6. Mild dilatation of the aortic arch measuring up to 4 cm. 7. Enlarged pulmonary arteries indicative of underlying pulmonary hypertension. 8. Chronic appearing L1 compression fracture. Correlation with clinical exam and point tenderness recommended. Electronically Signed   By: Elgie Collard M.D.   On: 2016/08/17 06:06   Dg Chest Port 1 View  Result Date: August 17, 2016 CLINICAL DATA:  83 year old male with shortness of breath and altered mental status. EXAM: PORTABLE CHEST 1 VIEW COMPARISON:  Chest radiograph dated 05/29/2016 FINDINGS: There is near complete opacification of the left hemithorax with only a very small aerated portion of the lung in the upper lobes. This findings is new compared to the prior radiograph. Right lung base atelectatic changes with probable small right pleural effusion. There is no pneumothorax. The left cardiac borders are silhouetted. There is atherosclerotic calcification of the aortic arch. There is no mediastinal shift. No acute osseous pathology. IMPRESSION: Near complete opacification of the left hemithorax, new from prior radiograph likely representing enlarged pleural effusion and associated compressive atelectasis of the lung. Superimposed pneumonia or a central/ perihilar mass is not entirely excluded. Clinical correlation and follow-up to resolution recommended. Probable trace right pleural effusion and minimal right lung base atelectatic changes. Electronically Signed   By: Elgie Collard M.D.   On: 17-Aug-2016 03:33    EKG: Independently reviewed. Rate 152, A flutter.    Echocardiogram 2017: Report reviewed  EF 55% PAP 40 No significant valvular disease          Assessment/Plan Principal Problem:   Sepsis (HCC) Active Problems:   Coronary artery disease due to lipid rich plaque   Essential hypertension   Pressure ulcer   Atrial fibrillation with RVR (HCC)   Deaf   AKI (acute kidney injury) (HCC)    Pleural effusion, left   Community acquired pneumonia of left lower lobe of lung (HCC)  1. Sepsis from pneumonia vs empyema:  Suspected source effusion/pneumonia. Organism unknown.   Patient meets criteria given tachycardia, tachypnea, and evidence of organ dysfunction.  Lactate 4.8 mmol/L and repeat ordered within 6 hours.  This patient is at high risk of poor outcomes with a qSOFA score of 3.  Antibiotics delivered in the ED.    -Sepsis bundle utilized:  -Blood and urine cultures drawn  -30 ml/kg bolus given in ED, will repeat lactic acid  -Antibiotics: vancomycin and Zosyn  -Check MRSA swab and de-escalate as able  -Consult to CVTS for source control  -Consult to Pulmonology, appreciate cares  -Repeat renal function and complete blood count in AM  -Code SEPSIS called to E-link     2. Acute kidney injury:  Baseline Cr 0.9, now doubled in setting of sepsis. -Check FeNA -IVF and trend Cr  3. Atrial flutter:  Rate 152.  CHADS2Vasc 4 on Xarelto.  Presumably last dose Xarelto on Tuesday morning. -Hold Xarelto for possible surgery -Hold home metoprolol, may need IV metop today  4. Pressure ulcer:  Existing prior to hospitalization -WOC consult  5. Other medciations:  -Continue Flomax and PPI -Continue Norco PRN        DVT prophylaxis: SCDs for now  Code Status: FULL presumed, no family available  Family Communication: None present  Disposition Plan: Anticipate CVTS consult for empyema.  Empiric antibiotics.  Prognosis guarded given age. Consults called: CCM Admission status: INPATIENT         Medical decision making: Patient seen at 6:10 AM on 08-13-16.  The patient was discussed with Dr. Blinda Leatherwood and Dr. Tyson Alias.  What exists of the patient's chart was reviewed in depth and summarized above.  Clinical condition: tachycardic and confused, but BP good.        Alberteen Sam [facility] Pager [phone]        At the time of admission,  it appears that the appropriate admission status for this patient is INPATIENT. This is judged to be reasonable and necessary in order to provide the required intensity of service to ensure the patient's safety given the presenting symptoms, physical exam findings, and initial radiographic and laboratory data in the context of their chronic comorbidities.  Together, these circumstances are felt to place him at high risk for further clinical deterioration threatening life, limb, or organ.   Patient requires inpatient status due to high intensity of service, high risk for further deterioration and high frequency of surveillance required because of this acute illness that poses a threat to life, limb or bodily function.  I certify that at the point of admission it is my clinical judgment that the patient will require inpatient hospital care spanning beyond 2 midnights from the point of admission and that early discharge would result in unnecessary risk of decompensation and readmission or threat to life, limb or bodily function.

## 2016-07-22 NOTE — Procedures (Signed)
PROCEDURE SUMMARY:  Successful US guided left thoracentesis. Yielded 400 mL of thick green fluid. Pt tolerated procedure well. No immediate complications.  Specimen was sent for labs. CXR ordered.  Kell Ferris S Garnet Chatmon PA-C 06-19-16 12:45 PM

## 2016-07-22 NOTE — ED Notes (Signed)
Pt is upset and states he wants to stand up and put on his trousers. Pt informed that he isn't allowed to get out of bed and was unable to stand up earlier at home which is why he is in the hospital. Pt refuses to use a urinal and will not give consent for an I&O.

## 2016-07-22 NOTE — Progress Notes (Addendum)
Pharmacy Antibiotic Note  Jeff Hunt is a 83 y.o. male admitted on Aug 05, 2016 with sepsis.  Pharmacy has been consulted for Vancomycin/Cefepime dosing.  Plan: Vancomycin 750 mg IV q24h Cefepime 2g IV q24h Trend WBC, temp, renal function  F/U infectious work-up Drug levels as indicated   Weight: 180 lb (81.6 kg)  Temp (24hrs), Avg:98.8 F (37.1 C), Min:97.8 F (36.6 C), Max:99.8 F (37.7 C)   Recent Labs Lab February 06, 2016 0324 February 06, 2016 0329  WBC 6.5  --   CREATININE 1.75*  --   LATICACIDVEN  --  4.80*    CrCl cannot be calculated (Unknown ideal weight.).    Allergies  Allergen Reactions  . Grapefruit Concentrate Other (See Comments)    Cannot take with several of his medications  . Amoxicillin Rash  . Penicillins Rash    Has patient had a PCN reaction causing immediate rash, facial/tongue/throat swelling, SOB or lightheadedness with hypotension: Yes Has patient had a PCN reaction causing severe rash involving mucus membranes or skin necrosis: Unknown Has patient had a PCN reaction that required hospitalization: Unknown Has patient had a PCN reaction occurring within the last 10 years: No If all of the above answers are "NO", then may proceed with Cephalosporin use.     Jeff Hunt, Jeff Hunt Aug 05, 2016 4:33 AM

## 2016-07-22 NOTE — ED Notes (Signed)
Pt requesting pain medication for sacrum pain. Pt reports three falls in which he has landed onto butt. Also has pressure ulcer noted to sacrum

## 2016-07-22 NOTE — Consult Note (Signed)
Cardiology Consultation:   Patient ID: Jeff Hunt; [id number]; 22-Sep-1925   Admit date: 2016-08-21 Date of Consult: 2016-08-21  Primary Care Provider: [name], MD Primary Cardiologist: Jeff Hunt [phone]) Primary Electrophysiologist:  None   Patient Profile:   Jeff Hunt is a 83 y.o. male with a hx of atrial fibrillation with RVR (not anticoagulated, hx of GI bleed-- see's Dr. Dulce Hunt),  BPH, hypertension, CVA, CKD III, depression,  per pt hx of CAD and cor stent "years ago" with cath afterwards that showed patent arteries, no records available.   The patient is being seen today for the evaluation of atrial flutter at the request of Jeff Hunt.  History of Present Illness:   Jeff Hunt patient has been experiencing cough and occasional choking over the last few weeks as well as lethargy. He presented to the Emergency Department very early this morning when his caregiver was unable to get him off of the commode due to severe weakness. He was diagnosed in the ED with community acquired pneumonia of left lower lobe. A CT chest was obtained showing a large loculated left pleural effusion with compressive atelectasis and a small right pleural effusion. He was admitted and started on broad spectrum abx. Radiology performed an US guided thoracentesis yielding of thick green fluid. Cardiothoracic surgery has been consulted for evaluation of chest tube placement vs surgery for the large lateral hydropneumothorax. The patient is a DNR and the patients daughter with POA wants [name] to be kept comfortable. Palliative consult is pending to discuss goals of care.   He was admitted August of 2017 with a GI bleed, right femoral neck fracture,  and atrial fibrillation/flutter with RVR (new diagnosis).  He initially responded to diltiazem gtt and was transitioned to pills.  Despite starting oral diltiazem and loading with digoxin, he developed intermittent periods of a-fib with  RVR in the 140s-150s that  lasted for several hours.  His diltiazem and digoxin were discontinued and his metoprolol was increased.  His HR improved and he was mildly bradycardic but asymptomatic.  With a CHADS-VASC score of 5  Xarelto was decided for anticoagulation.  He presented to the ER again on 05/29/2016 he was noted to not be on Xarelto because of GI bleeds requiring him not to take it-- no records of this in epic.  Patient history obtained from medical review, he is awake, alert and seems to be responding appropriately but he is hard of hearing and he is VERY difficult to understand. On arrival to the ER he was noted to be in atrial fib/flutter high rates to the 150s. It was not felt that rate controlling medication could be given safely due to hypotension (sBP 99/78). He has since been given a dose of 5 mg IV lopressor which has not improved his heart rate.  Blood pressure 105/85, pulse (!) 150, temperature (!) 97.4 F (36.3 C), temperature source Oral, resp. rate 20, weight 180 lb (81.6 kg), SpO2 100 %.    Past Medical History:  Diagnosis Date  . Arthropathy, unspecified, site unspecified   . BPH with obstruction/lower urinary tract symptoms   . Cataract    cortical senile  . Cerebrovascular disease, unspecified   . Chronic renal insufficiency, stage II (mild)   . Chronic rhinitis   . Chronic venous insufficiency   . Coronary atherosclerosis of unspecified type of vessel, native or graft    s/p stent placement "decades ago", hx of cath after this which showed patent  stent per pt report.  Marland Kitchen. DEPRESSION 12/06/2006   Qualifier: Diagnosis of  By: Roxan Hockeyobinson CMA, Shanda BumpsJessica    . Depressive disorder, not elsewhere classified   . Hearing loss of both ears   . History of actinic keratoses 08/2011  . History of iron deficiency anemia 2013   Pt declined GI eval on multiple occasions  . Lichen simplex chronicus 08/2011  . Lumbar degenerative disc disease 06/2009   Mild osteoarthritic changes of  L-spine diffusely on x-rays  . Memory loss   . Osteoarthritis 06/2009   X-rays: both hips-mild  . Other and unspecified hyperlipidemia   . Rectal bleeding 2015   on/off x 3 mo; saw Dr. Dulce [facility]ellarutlaw with [facility]; CT abd/pelvis + Anusol HC supp regimen rx'd.  It appears the CT was ordered but not done (as of 02/20/14)  . TRANSIENT ISCHEMIC ATTACK, HX OF 12/06/2006   Qualifier: Diagnosis of  By: Roxan Hockeyobinson CMA, Shanda BumpsJessica    . Urticarial dermatitis    Skin biopsy 11/2010 favored "urticarial allergic rxn related to localized hypersensitivity such as insect bite or arthropod assault"  . Venous stasis dermatitis, left 07/2007   Biopsy showed angiodermatitis, with all tests for infection NEG (fungal stain, AFB stain, and gram stain).    Past Surgical History:  Procedure Laterality Date  . ANTERIOR APPROACH HEMI HIP ARTHROPLASTY Right 08/24/2015   Procedure: ANTERIOR APPROACH HEMI HIP ARTHROPLASTY;  Surgeon: Cammy CopaScott Gregory Dean, MD;  Location: [facility] OR;  Service: Orthopedics;  Laterality: Right;  . APPENDECTOMY    . CATARACT EXTRACTION  10/24/2007   right  . CATARACT EXTRACTION  11/07/2007   left  . CORONARY ANGIOPLASTY WITH STENT PLACEMENT    . TONSILLECTOMY       Inpatient Medications: Scheduled Meds: . metoprolol tartrate  5 mg Intravenous Q6H  . sodium chloride flush  3 mL Intravenous Q12H   Continuous Infusions: . sodium chloride 100 mL/hr (26-May-2016 1440)  . ceFEPime (MAXIPIME) IV    . vancomycin     PRN Meds: acetaminophen **OR** acetaminophen, ondansetron **OR** ondansetron (ZOFRAN) IV  Allergies:    Allergies  Allergen Reactions  . Grapefruit Concentrate Other (See Comments)    Cannot take with several of his medications  . Amoxicillin Rash  . Penicillins Rash    Has patient had a PCN reaction causing immediate rash, facial/tongue/throat swelling, SOB or lightheadedness with hypotension: Yes Has patient had a PCN reaction causing severe rash involving mucus membranes or skin necrosis:  Unknown Has patient had a PCN reaction that required hospitalization: Unknown Has patient had a PCN reaction occurring within the last 10 years: No If all of the above answers are "NO", then may proceed with Cephalosporin use.     Social History:   Social History   Social History  . Marital status: Single    Spouse name: N/A  . Number of children: N/A  . Years of education: N/A   Occupational History  . Retired Retired   Social History Main Topics  . Smoking status: Former Smoker    Types: Pipe  . Smokeless tobacco: Never Used     Comment: Quit > 23 yrs ago.  . Alcohol use 10.5 oz/week    21 Standard drinks or equivalent per week  . Drug use: No  . Sexual activity: No   Other Topics Concern  . Not on file   Social History Narrative    HSG,  PhD Chem. 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 Married '  7- 31- divorced, married June 08, 1978- 9 years-divorce, married 08-Jun-1990-  10-divorce. Had a companion who died.1 son - 2056/05/08 dtrs - 2051-06-08, 06-08-2058, '62, '64; 6 grandchildren. 7 great-grands. 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 ACP - he thinks he has a living will. For now he wishes to have CPR, short-term mechanical ventilation  POA dtr - Jeff Hunt  (c) [phone]      Family History:   The patient's family history includes Dementia in his sister; Heart disease (age of onset: 88) in his father; Hypertension in his mother. There is no history of Colon cancer.  ROS:  Please see the history of present illness.  All other ROS reviewed and negative.     Physical Exam/Data:   Vitals:   07/27/2016 1155 07/06/2016 1306 07/08/2016 1333 07/30/2016 1400  BP: 113/84 107/83  100/86  Pulse: (!) 150     Resp: (!) 24 (!) 21  (!) 21  Temp:  (!) 97.4 F (36.3 C)    TempSrc:  Oral    SpO2: 98%  96% 100%  Weight:        Intake/Output Summary (Last 24 hours) at 07/05/2016 1452 Last data filed at 07/08/2016 1441  Gross per 24 hour  Intake              3653 ml  Output                0 ml  Net             3653 ml   Filed Weights   07/19/2016 0355  Weight: 180 lb (81.6 kg)   Body mass index is 25.1 kg/m.  General: frail appearing and deconditioned. Head: Normocephalic, atraumatic, Neck: Negative for carotid bruits. JVD not elevated. Lungs: increased effort of breathing on nasal cannula, +crackles/ronchi. Heart: irregular rhythm, tachycardia  Abdomen: Soft, non-tender, non-distended Msk:  Strength and tone appear normal for age. Extremities: No clubbing or cyanosis. No edema.  Neuro: Alert   EKG:  The EKG was personally reviewed and demonstrates atrial flutter rates 150s.   Relevant CV Studies:  Echocardiogram 08/25/2015  Study Conclusions  - Left ventricle: The cavity size was normal. Wall thickness was at   the upper limits of normal. The estimated ejection fraction was   55%. The study is not technically sufficient to allow evaluation   of LV diastolic function. - Aortic valve: Mildly calcified annulus. Trileaflet. - Mitral valve: Mildly thickened leaflets . There was trivial   regurgitation. - Left atrium: The atrium was mildly dilated. - Right ventricle: Systolic function was low normal. - Right atrium: The atrium was mildly dilated. - Tricuspid valve: There was mild-moderate regurgitation. Peak   RV-RA gradient (S): 40 mm Hg. - Pericardium, extracardiac: There was no pericardial effusion.  Impressions:  - Upper normal LV wall thickness with LVEF approximately 55%.   Indeterminate diastolic function. Mild left atrial enlargement.   Mildly thickened mitral leaflets with trivial mitral   regurgitation. Mildly sclerotic aortic valve. Low normal RV   contraction. Mild to moderate tricuspid regurgitation. RV-RA   gradient 40 mmHg.  Laboratory Data:  Chemistry Recent Labs Lab 07/25/2016 0324  NA 140  K 5.1  CL 106  CO2 21*  GLUCOSE 122*  BUN 53*  CREATININE 1.75*  CALCIUM 8.2*  GFRNONAA 33*  GFRAA 38*    ANIONGAP 13     Recent Labs Lab 07/18/2016 0324  PROT 6.1*  ALBUMIN 2.1*  AST 28  ALT 15*  ALKPHOS 91  BILITOT 1.5*   Hematology Recent Labs Lab 09-Aug-2016 0324  WBC 6.5  RBC 4.83  HGB 13.2  HCT 43.3  MCV 89.6  MCH 27.3  MCHC 30.5  RDW 16.8*  PLT 177    Radiology/Studies:  Dg Chest 1 View  Result Date: August 09, 2016 CLINICAL DATA:  Status post left thoracentesis EXAM: CHEST 1 VIEW COMPARISON:  Chest radiograph from earlier today. FINDINGS: Stable cardiomediastinal silhouette with normal heart size. There is a moderate to large lateral basilar left hydropneumothorax with significant reduction in the pleural fluid component status post thoracentesis. No right pneumothorax. Slight right mediastinal shift is not definitely changed accounting for slight rightward rotation. Stable volume loss and compressive atelectasis throughout the mid to lower left lung. No pulmonary edema. IMPRESSION: 1. Moderate to large lateral basilar left hydropneumothorax, with replacement of pleural fluid by pleural air status post thoracentesis. 2. Stable volume loss and compressive atelectasis throughout the mid to lower left lung, suggesting trapped lung. 3. Stable slight right mediastinal shift. Critical Value/emergent results were called by telephone at the time of interpretation on August 09, 2016 at 1:08 pm to Jeff Hunt, who verbally acknowledged these results. Electronically Signed   By: Delbert Phenix M.D.   On: 08-09-16 13:13   Ct Chest Wo Contrast  Result Date: 08-09-2016 CLINICAL DATA:  83 year old male with opacification of the left hemithorax. EXAM: CT CHEST WITHOUT CONTRAST TECHNIQUE: Multidetector CT imaging of the chest was performed following the standard protocol without IV contrast. COMPARISON:  Chest radiograph dated 08-09-16 FINDINGS: Evaluation of this exam is limited in the absence of intravenous contrast. Evaluation is also limited due to streak artifact caused by patient's arms.  Cardiovascular: There is no cardiomegaly or pericardial effusion. Multi vessel coronary vascular calcifications primarily involving the LAD, RCA, and left circumflex artery. There is moderate atherosclerotic calcification of thoracic aorta. There is mild dilatation of the aortic arch measuring approximately 4 cm a diameter. Evaluation of the aorta is limited in the absence of intravenous contrast. There is enlargement of the main pulmonary trunk indicative of underlying pulmonary hypertension. Mediastinum/Nodes: There is no hilar or mediastinal adenopathy. Evaluation of the left hilum is limited due to atelectatic changes of the adjacent lung and noncontrast nature of the study. There is mild shift of the mediastinum to the right hemithorax. The esophagus and the thyroid gland are grossly unremarkable. Lungs/Pleura: There is a small right pleural effusion. Large multiloculated appearing left pleural effusion occupying the majority of the left hemithorax. There is associated compressive atelectasis of the left lung with small area of aerated lung in the left upper lobe. Patchy area of ground-glass and hazy density in the left upper lobe likely atelectatic changes although infiltrate is not excluded. Right lung base atelectatic changes versus infiltrate. There is no pneumothorax. There is compression of the left upper and left lower lobe bronchi. Upper Abdomen: There is colonic diverticulosis. Musculoskeletal: Diffuse subcutaneous edema and anasarca. Degenerative changes of the spine. There is compression fracture of the L1 vertebra with approximately 50% loss of vertebral body height and anterior wedging, seen on the radiograph of 12/10/2015. No definite acute fracture. IMPRESSION: 1. Large multiloculated left pleural effusion occupying the majority of the left hemithorax. There is associated mass effect and compressive atelectasis of the majority of the left lung with a small aerated portion in the left upper lobe.  2. Compression of the left upper and left lower lobe bronchi secondary to mass effect caused by loculated left pleural effusion.  There is also mild shift of the mediastinum to the right. 3. Small right pleural effusion with right lung base atelectasis versus infiltrate. 4. Multi vessel coronary vascular disease. 5.  Aortic Atherosclerosis (ICD10-I70.0). 6. Mild dilatation of the aortic arch measuring up to 4 cm. 7. Enlarged pulmonary arteries indicative of underlying pulmonary hypertension. 8. Chronic appearing L1 compression fracture. Correlation with clinical exam and point tenderness recommended. Electronically Signed   By: Elgie Collard M.D.   On: 07/07/2016 06:06   Dg Chest Port 1 View  Result Date: 07/15/2016 CLINICAL DATA:  83 year old male with shortness of breath and altered mental status. EXAM: PORTABLE CHEST 1 VIEW COMPARISON:  Chest radiograph dated 05/29/2016 FINDINGS: There is near complete opacification of the left hemithorax with only a very small aerated portion of the lung in the upper lobes. This findings is new compared to the prior radiograph. Right lung base atelectatic changes with probable small right pleural effusion. There is no pneumothorax. The left cardiac borders are silhouetted. There is atherosclerotic calcification of the aortic arch. There is no mediastinal shift. No acute osseous pathology. IMPRESSION: Near complete opacification of the left hemithorax, new from prior radiograph likely representing enlarged pleural effusion and associated compressive atelectasis of the lung. Superimposed pneumonia or a central/ perihilar mass is not entirely excluded. Clinical correlation and follow-up to resolution recommended. Probable trace right pleural effusion and minimal right lung base atelectatic changes. Electronically Signed   By: Elgie Collard M.D.   On: 07/06/2016 03:33    Assessment and Plan:   1. Atrial flutter with RVR: He has a history of atrial fibrillation with  RVR, rates up into the 140-150s. He is not currently on anticoagulation and  is a higher risk of falls and GI bleeding. It has also not been determined if he will get a chest tube or surgical procedure to treat his hydropneumothorax. If we do decided to start oral anticoagulation then this would not be done until no further procedures expected. Will discuss anticoagulation plan +/- heparin with attending.  He is currently septic and I suspect this has a factor in his difficult to control rates. So far he has  received Lopressor IV 5 mg 1 dose, scheduled for every 6 hours and fluids. His BP remains soft, low 100s making it difficult to give large doses of rate controlling medications. His previous admission 08/2015) he did well on Metoprolol Tartrate 25 mg PO BID after a trial of dig and dilt.  This patients CHA2DS2-VASc Score and unadjusted Ischemic Stroke Rate (% per year) is equal to 7.2 % stroke rate/year from a score of 5 Above score calculated as 1 point each if present [CHF, HTN, DM, Vascular=MI/PAD/Aortic Plaque, Age if 65-74, or Male], 2 points each if present [Age > 75, or Stroke/TIA/TE]    2. CAP with  large lateral hydropneumothorax: IM and CTS managing.    SignedDorthula Matas, PA-C  08/02/2016 2:52 PM   History and all data above reviewed.  Patient examined.  I agree with the findings as above.  The patient was admitted with weakness and questionable pneumonia.  He is very agitated when talking to me and refuses to answer questions.  We are called because of atrial flutter with rapid rate.  He is chronically in fib flutter apparently and not on anticoagulation because of history of bleeding.  He does report that he has had a fall.  He denies chest pain or SOB.  I tried to call his HCPOA but got  voicemail.   The patient exam reveals [id number]  ,  Lungs: Decreased breath sounds  ,  Abd: Positive bowel sounds, no rebound no guarding, Ext no edema  .  All available labs, radiology  testing, previous records reviewed. Agree with documented assessment and plan. ATRIAL FLUTTER/FIB:  Rate is increased.  This is likely secondary to the acute illness.  BP will not allow med titration. Needs treatment of effusion, infection or other goals of care to be established.  He has not been an anticoagulation candidate and I would prefer to avoid amiodarone with the possibility of cardioversion with this med without adequate anticoagulation.   Jeff Hunt  4:57 PM  07-24-2016

## 2016-07-22 NOTE — Plan of Care (Signed)
Problem: Education: Goal: Knowledge of [facility] Education information/materials will improve Outcome: Progressing Patient is confused, caregiver at bedside involved in care and participating in education.  Problem: Health Behavior/Discharge Planning: Goal: Ability to manage health-related needs will improve Outcome: Not Progressing Patient is unable to care for himself, but caregiver at bedside manages his health needs.  Problem: Pain Managment: Goal: General experience of comfort will improve Outcome: Progressing Caregiver at bedside providing additional comfort measures.  Problem: Physical Regulation: Goal: Ability to maintain clinical measurements within normal limits will improve Outcome: Progressing HR elevated, other VSS, lactic acid elevated but trending down. Goal: Will remain free from infection Outcome: Progressing IV antibiotics administered per MD orders  Problem: Skin Integrity: Goal: Risk for impaired skin integrity will decrease Outcome: Progressing Pt unable to turn himself in bed, assisted with q2hr turning, heels elevated off bed  Problem: Tissue Perfusion: Goal: Risk factors for ineffective tissue perfusion will decrease Outcome: Progressing SCDs in place  Problem: Nutrition: Goal: Adequate nutrition will be maintained Outcome: Not Progressing NPO pending SLP evalutation

## 2016-07-22 NOTE — ED Triage Notes (Signed)
Pt to ED from home with daughter c/o weakness and lethargy. Weakness has been ongoing for a couple weeks per caregiver. EMS was called tonight after pt was unable to stand from toilet. Initial bp 80/60 which improved with NS. Pt also tachycardic

## 2016-07-22 NOTE — Progress Notes (Addendum)
CRITICAL VALUE ALERT  Critical Value:  Lactic acid 3.9   Date & Time Notied:  01-10-2016 8:23 PM   Provider Notified: Schorr, NP  Orders Received/Actions taken: 500 ml NS bolus

## 2016-07-22 NOTE — ED Notes (Signed)
Pt taken to CT.

## 2016-07-22 NOTE — Progress Notes (Signed)
Patient is on 5L HFNC at this time and is tolerating well. BIPAP is not needed at this time. Will continue to monitor.

## 2016-07-22 NOTE — ED Provider Notes (Signed)
 [facility]-EMERGENCY DEPT Provider Note   CSN: [id number] Arrival date & time: 07-29-2016  0303  By signing my name below, I, Jeff Hunt, attest that this documentation has been prepared under the direction and in the presence of Zohar Maroney, Canary Brim, MD. Electronically Signed: [name], ED Scribe. 2016/07/29. 3:16 AM.  History   Chief Complaint Chief Complaint  Patient presents with  . Weakness   LEVEL V CAVEAT: HPI and ROS limited due to acuity of condition; confusion, difficulty understanding patient's speech.   HPI [name] is a 83 y.o. male who presents to the Emergency Department complaining of gradually worsening, weakness for the last few days. Associated sx include decreased appetite due to trouble swallowing, and cough. He had a hard time getting up from the bathroom, per pt's caregiver. Pt denies fever and chills.  The history is provided by the patient and a caregiver. The history is limited by the condition of the patient. No language interpreter was used.    Past Medical History:  Diagnosis Date  . Arthropathy, unspecified, site unspecified   . BPH with obstruction/lower urinary tract symptoms   . Cataract    cortical senile  . Cerebrovascular disease, unspecified   . Chronic renal insufficiency, stage II (mild)   . Chronic rhinitis   . Chronic venous insufficiency   . Coronary atherosclerosis of unspecified type of vessel, native or graft    s/p stent placement "decades ago", hx of cath after this which showed patent stent per pt report.  Marland Kitchen DEPRESSION 12/06/2006   Qualifier: Diagnosis of  By: Roxan Hockey CMA, Shanda Bumps    . Depressive disorder, not elsewhere classified   . Hearing loss of both ears   . History of actinic keratoses 08/2011  . History of iron deficiency anemia 2013   Pt declined GI eval on multiple occasions  . Lichen simplex chronicus 08/2011  . Lumbar degenerative disc disease 06/2009   Mild osteoarthritic changes of L-spine diffusely on  x-rays  . Memory loss   . Osteoarthritis 06/2009   X-rays: both hips-mild  . Other and unspecified hyperlipidemia   . Rectal bleeding 2015   on/off x 3 mo; saw Dr. Dulce Sellar with [facility]; CT abd/pelvis + Anusol HC supp regimen rx'd.  It appears the CT was ordered but not done (as of 02/20/14)  . TRANSIENT ISCHEMIC ATTACK, HX OF 12/06/2006   Qualifier: Diagnosis of  By: Roxan Hockey CMA, Shanda Bumps    . Urticarial dermatitis    Skin biopsy 11/2010 favored "urticarial allergic rxn related to localized hypersensitivity such as insect bite or arthropod assault"  . Venous stasis dermatitis, left 07/2007   Biopsy showed angiodermatitis, with all tests for infection NEG (fungal stain, AFB stain, and gram stain).    Patient Active Problem List   Diagnosis Date Noted  . Sepsis (HCC) 07-29-2016  . Atrial fibrillation with RVR (HCC) July 29, 2016  . Deaf 07-29-16  . AKI (acute kidney injury) (HCC) 2016-07-29  . Pleural effusion, left 2016-07-29  . Community acquired pneumonia of left lower lobe of lung (HCC) 07-29-2016  . Pressure ulcer 08/30/2015  . Acute urinary retention 08/29/2015  . Hip fracture (HCC) 08/24/2015  . Fall   . Femoral neck fracture (HCC) 08/23/2015  . Atrial fibrillation (HCC) 08/23/2015  . Rectal bleeding 11/06/2013  . GERD (gastroesophageal reflux disease) 03/15/2012  . Atypical chest pain 02/24/2012  . Loss of weight 01/01/2012  . Dizziness 08/20/2011  . Rotator cuff impingement syndrome 07/22/2011  . Essential hypertension 03/16/2011  .  Rash 03/06/2011  . Irregular cardiac rhythm 02/18/2011  . Peripheral edema 02/18/2011  . Anemia, iron deficiency 02/03/2011  . SKIN LESIONS, MULTIPLE 12/18/2009  . LEG PAIN 12/03/2009  . VENOUS INSUFFICIENCY, LEGS 06/14/2009  . HEMATOCHEZIA 12/06/2008  . GROSS HEMATURIA 12/06/2008  . ONYCHOMYCOSIS, TOENAILS 08/09/2007  . DERMATITIS, STASIS 08/09/2007  . MEMORY LOSS 08/01/2007  . HYPERLIPIDEMIA 12/06/2006  . DEPRESSION 12/06/2006  .  Coronary artery disease due to lipid rich plaque 12/06/2006  . CEREBROVASCULAR DISEASE 12/06/2006  . BENIGN PROSTATIC HYPERTROPHY 12/06/2006  . ARTHRITIS 12/06/2006    Past Surgical History:  Procedure Laterality Date  . ANTERIOR APPROACH HEMI HIP ARTHROPLASTY Right 08/24/2015   Procedure: ANTERIOR APPROACH HEMI HIP ARTHROPLASTY;  Surgeon: Cammy Copa, MD;  Location: [facility] OR;  Service: Orthopedics;  Laterality: Right;  . APPENDECTOMY    . CATARACT EXTRACTION  10/24/2007   right  . CATARACT EXTRACTION  11/07/2007   left  . CORONARY ANGIOPLASTY WITH STENT PLACEMENT    . TONSILLECTOMY         Home Medications    Prior to Admission medications   Medication Sig Start Date End Date Taking? Authorizing Provider  acetaZOLAMIDE (DIAMOX) 250 MG tablet ALTERNATE 2 TABLETS BY MOUTH EVERY DAY WITH 1 TABLET BY MOUTH EVERY DAY. 05/14/14   McGowen, Maryjean Morn, MD  citalopram (CELEXA) 20 MG tablet Take 20 mg by mouth daily. 08/16/15   [provider]  ferrous sulfate 325 (65 FE) MG tablet Take 325 mg by mouth 2 (two) times daily.    [provider]  fluticasone (FLONASE) 50 MCG/ACT nasal spray INSTILL 2 SPRAYS INTO EACH NOSTRIL DAILY 01/11/14   McGowen, Maryjean Morn, MD  HYDROcodone-acetaminophen (NORCO/VICODIN) 5-325 MG tablet Take 1 tablet by mouth every 6 (six) hours as needed for moderate pain or severe pain. 08/29/15   Cammy Copa, MD  metoprolol tartrate (LOPRESSOR) 25 MG tablet Take 1 tablet (25 mg total) by mouth 2 (two) times daily. 08/30/15   Renae Fickle, MD  pantoprazole (PROTONIX) 40 MG tablet Take 1 tablet (40 mg total) by mouth daily. 04/30/14   McGowen, Maryjean Morn, MD  polycarbophil (FIBERCON) 625 MG tablet Take 625 mg by mouth daily.    [provider]  Potassium Gluconate 550 MG TABS Take 1 tablet by mouth daily.    [provider]  rivaroxaban (XARELTO) 20 MG TABS tablet Take 1 tablet (20 mg total) by mouth daily with breakfast. 08/30/15    Renae Fickle, MD  simvastatin (ZOCOR) 80 MG tablet TAKE 1 TABLET BY MOUTH AT BEDTIME 06/13/14   McGowen, Maryjean Morn, MD  tamsulosin (FLOMAX) 0.4 MG CAPS capsule Take 1 capsule (0.4 mg total) by mouth daily. 04/16/14   McGowen, Maryjean Morn, MD  triamcinolone (KENALOG) 0.025 % cream Apply 2-3 times daily to skin eruptions, hives    [provider]    Family History Family History  Problem Relation Age of Onset  . Heart disease Father 21  . Hypertension Mother   . Dementia Sister   . Colon cancer Neg Hx     Social History Social History  Substance Use Topics  . Smoking status: Former Smoker    Types: Pipe  . Smokeless tobacco: Never Used     Comment: Quit > 23 yrs ago.  . Alcohol use 10.5 oz/week    21 Standard drinks or equivalent per week     Allergies   Grapefruit concentrate; Amoxicillin; and Penicillins   Review of Systems Review of  Systems  Unable to perform ROS: Patient nonverbal     Physical Exam Updated Vital Signs BP (!) 115/94   Pulse (!) 147   Temp 99.8 F (37.7 C) (Rectal)   Resp (!) 27   Wt 81.6 kg (180 lb)   SpO2 95%   BMI 25.10 kg/m   Physical Exam  Constitutional: He is oriented to person, place, and time. He appears well-developed and well-nourished. No distress.  HENT:  Head: Normocephalic and atraumatic.  Right Ear: Hearing normal.  Left Ear: Hearing normal.  Nose: Nose normal.  Mouth/Throat: Mucous membranes are dry.  Eyes: Pupils are equal, round, and reactive to light. Conjunctivae and EOM are normal.  Neck: Normal range of motion. Neck supple.  Cardiovascular: Regular rhythm, S1 normal and S2 normal.  Tachycardia present.  Exam reveals no gallop and no friction rub.   No murmur heard. Pulmonary/Chest: Tachypnea noted. No respiratory distress. He exhibits no tenderness.  Shallow respirations.   Abdominal: Soft. Normal appearance and bowel sounds are normal. There is no hepatosplenomegaly. There is no tenderness. There is no  rebound, no guarding, no tenderness at McBurney's point and negative Murphy's sign. No hernia.  Musculoskeletal: Normal range of motion.  Neurological: He is alert and oriented to person, place, and time. No cranial nerve deficit or sensory deficit. Coordination normal. GCS eye subscore is 4. GCS verbal subscore is 5. GCS motor subscore is 6.  Diffuse symmetric weakness.   Skin: Skin is warm, dry and intact. No rash noted. No cyanosis.  Sacral pressure ulcer.  Psychiatric: He has a normal mood and affect. His speech is normal and behavior is normal. Thought content normal.  Nursing note and vitals reviewed.    ED Treatments / Results  DIAGNOSTIC STUDIES: Oxygen Saturation is 95% on RA, adequate by my interpretation.   Labs (all labs ordered are listed, but only abnormal results are displayed) Labs Reviewed  COMPREHENSIVE METABOLIC PANEL - Abnormal; Notable for the following:       Result Value   CO2 21 (*)    Glucose, Bld 122 (*)    BUN 53 (*)    Creatinine, Ser 1.75 (*)    Calcium 8.2 (*)    Total Protein 6.1 (*)    Albumin 2.1 (*)    ALT 15 (*)    Total Bilirubin 1.5 (*)    GFR calc non Af Amer 33 (*)    GFR calc Af Amer 38 (*)    All other components within normal limits  CBC WITH DIFFERENTIAL/PLATELET - Abnormal; Notable for the following:    RDW 16.8 (*)    All other components within normal limits  PROTIME-INR - Abnormal; Notable for the following:    Prothrombin Time 17.9 (*)    All other components within normal limits  I-STAT CG4 LACTIC ACID, ED - Abnormal; Notable for the following:    Lactic Acid, Venous 4.80 (*)    All other components within normal limits  I-STAT ARTERIAL BLOOD GAS, ED - Abnormal; Notable for the following:    pCO2 arterial 28.2 (*)    pO2, Arterial 120.0 (*)    Bicarbonate 17.4 (*)    Acid-base deficit 6.0 (*)    All other components within normal limits  I-STAT CG4 LACTIC ACID, ED - Abnormal; Notable for the following:    Lactic Acid,  Venous 3.90 (*)    All other components within normal limits  CULTURE, BLOOD (ROUTINE X 2)  CULTURE, BLOOD (ROUTINE X 2)  URINE  CULTURE  MRSA PCR SCREENING  URINALYSIS, ROUTINE W REFLEX MICROSCOPIC    EKG  EKG Interpretation  Date/Time:  Wednesday 08/03/2016 03:07:17 EDT Ventricular Rate:  152 PR Interval:    QRS Duration: 149 QT Interval:  329 QTC Calculation: 524 R Axis:   118 Text Interpretation:  Atrial flutter inc rbbb Confirmed by Gilda Crease [phone]) on 08/03/2016 3:32:08 AM       Radiology Ct Chest Wo Contrast  Result Date: 2016-08-03 CLINICAL DATA:  83 year old male with opacification of the left hemithorax. EXAM: CT CHEST WITHOUT CONTRAST TECHNIQUE: Multidetector CT imaging of the chest was performed following the standard protocol without IV contrast. COMPARISON:  Chest radiograph dated 2016/08/03 FINDINGS: Evaluation of this exam is limited in the absence of intravenous contrast. Evaluation is also limited due to streak artifact caused by patient's arms. Cardiovascular: There is no cardiomegaly or pericardial effusion. Multi vessel coronary vascular calcifications primarily involving the LAD, RCA, and left circumflex artery. There is moderate atherosclerotic calcification of thoracic aorta. There is mild dilatation of the aortic arch measuring approximately 4 cm a diameter. Evaluation of the aorta is limited in the absence of intravenous contrast. There is enlargement of the main pulmonary trunk indicative of underlying pulmonary hypertension. Mediastinum/Nodes: There is no hilar or mediastinal adenopathy. Evaluation of the left hilum is limited due to atelectatic changes of the adjacent lung and noncontrast nature of the study. There is mild shift of the mediastinum to the right hemithorax. The esophagus and the thyroid gland are grossly unremarkable. Lungs/Pleura: There is a small right pleural effusion. Large multiloculated appearing left pleural effusion  occupying the majority of the left hemithorax. There is associated compressive atelectasis of the left lung with small area of aerated lung in the left upper lobe. Patchy area of ground-glass and hazy density in the left upper lobe likely atelectatic changes although infiltrate is not excluded. Right lung base atelectatic changes versus infiltrate. There is no pneumothorax. There is compression of the left upper and left lower lobe bronchi. Upper Abdomen: There is colonic diverticulosis. Musculoskeletal: Diffuse subcutaneous edema and anasarca. Degenerative changes of the spine. There is compression fracture of the L1 vertebra with approximately 50% loss of vertebral body height and anterior wedging, seen on the radiograph of 12/10/2015. No definite acute fracture. IMPRESSION: 1. Large multiloculated left pleural effusion occupying the majority of the left hemithorax. There is associated mass effect and compressive atelectasis of the majority of the left lung with a small aerated portion in the left upper lobe. 2. Compression of the left upper and left lower lobe bronchi secondary to mass effect caused by loculated left pleural effusion. There is also mild shift of the mediastinum to the right. 3. Small right pleural effusion with right lung base atelectasis versus infiltrate. 4. Multi vessel coronary vascular disease. 5.  Aortic Atherosclerosis (ICD10-I70.0). 6. Mild dilatation of the aortic arch measuring up to 4 cm. 7. Enlarged pulmonary arteries indicative of underlying pulmonary hypertension. 8. Chronic appearing L1 compression fracture. Correlation with clinical exam and point tenderness recommended. Electronically Signed   By: Elgie Collard M.D.   On: Aug 03, 2016 06:06   Dg Chest Port 1 View  Result Date: 08/03/2016 CLINICAL DATA:  83 year old male with shortness of breath and altered mental status. EXAM: PORTABLE CHEST 1 VIEW COMPARISON:  Chest radiograph dated 05/29/2016 FINDINGS: There is near  complete opacification of the left hemithorax with only a very small aerated portion of the lung in the upper lobes. This findings  is new compared to the prior radiograph. Right lung base atelectatic changes with probable small right pleural effusion. There is no pneumothorax. The left cardiac borders are silhouetted. There is atherosclerotic calcification of the aortic arch. There is no mediastinal shift. No acute osseous pathology. IMPRESSION: Near complete opacification of the left hemithorax, new from prior radiograph likely representing enlarged pleural effusion and associated compressive atelectasis of the lung. Superimposed pneumonia or a central/ perihilar mass is not entirely excluded. Clinical correlation and follow-up to resolution recommended. Probable trace right pleural effusion and minimal right lung base atelectatic changes. Electronically Signed   By: Elgie Collard M.D.   On: 07/09/2016 03:33    Procedures Procedures (including critical care time)  Medications Ordered in ED Medications  vancomycin (VANCOCIN) IVPB 750 mg/150 ml premix (not administered)  ceFEPIme (MAXIPIME) 2 g in dextrose 5 % 50 mL IVPB (not administered)  sodium chloride 0.9 % bolus 1,000 mL (0 mLs Intravenous Stopped 07/05/2016 0427)  sodium chloride 0.9 % bolus 1,000 mL (0 mLs Intravenous Stopped 07/15/2016 0445)    And  sodium chloride 0.9 % bolus 1,000 mL (0 mLs Intravenous Stopped 07/28/2016 0621)    And  sodium chloride 0.9 % bolus 500 mL (0 mLs Intravenous Stopped 08/04/2016 0629)  ceFEPIme (MAXIPIME) 2 g in dextrose 5 % 50 mL IVPB (0 g Intravenous Stopped  0620)  vancomycin (VANCOCIN) IVPB 1000 mg/200 mL premix (0 mg Intravenous Stopped 07/31/2016 0621)  acetaminophen (TYLENOL) suppository 650 mg (650 mg Rectal Given  0423)     Initial Impression / Assessment and Plan / ED Course  I have reviewed the triage vital signs and the nursing notes.  Pertinent labs & imaging results that were available  during my care of the patient were reviewed by me and considered in my medical decision making (see chart for details).     Patient presents to the emergency department for evaluation of generalized weakness. Patient brought to the ER from home. Caregiver reports that he has had progressive weakness over the last several weeks. He has had difficulty swallowing, has not been able to eat for several weeks. She also indicates that he has had some trouble swallowing liquids, often chokes when he drinks. He was in mild respiratory distress at arrival. He was placed on BiPAP. Temp was 99.8, not clearly a fever but he felt warm. He was hypotensive and tachycardic. Sepsis was considered likely. Was started on fluid bolus. Rhythm was felt to be atrial flutter at arrival, has history of chronic A. fib/A flutter. With his hypotension, it was felt that getting him rate control would cause severe hypotension and therefore was withheld. He was covered with broad-spectrum antibiotics. Chest x-ray showed complete opacification of his left lung. He had markedly elevated lactic acid.  Case discussed with Dr. Tyson Alias, critical care. He has been evaluated by critical care, agreed with current plan, other than discontinuing BiPAP. Recommended CT chest which was performed. This shows large loculated pleural effusion without obvious pneumonia. Patient was seen by critical care here in the ER. Did not recommend rate control at this time secondary to his hypotension.  Will order thoracentesis for this morning. Patient will be admitted to step down unit.  CRITICAL CARE Performed by: Gilda Crease   Total critical care time: 35 minutes  Critical care time was exclusive of separately billable procedures and treating other patients.  Critical care was necessary to treat or prevent imminent or life-threatening deterioration.  Critical care was time spent personally  by me on the following activities: development of  treatment plan with patient and/or surrogate as well as nursing, discussions with consultants, evaluation of patient's response to treatment, examination of patient, obtaining history from patient or surrogate, ordering and performing treatments and interventions, ordering and review of laboratory studies, ordering and review of radiographic studies, pulse oximetry and re-evaluation of patient's condition.   Final Clinical Impressions(s) / ED Diagnoses   Final diagnoses:  SOB (shortness of breath)  Lactic acidosis  Pleural effusion  Generalized weakness    New Prescriptions New Prescriptions   No medications on file   I personally performed the services described in this documentation, which was scribed in my presence. The recorded information has been reviewed and is accurate.    Gilda Crease, MD 07/22/2016 [phone]

## 2016-07-22 NOTE — Consult Note (Signed)
Name: Jeff Hunt MRN: [id number] DOB: 01/28/1925    ADMISSION DATE:  11/29/2016 CONSULTATION DATE:  03/12/2016  REFERRING MD :  Blinda LeatherwoodPollina  CHIEF COMPLAINT:  Weakness   HISTORY OF PRESENT ILLNESS:  Jeff Hunt is a 83 y.o. male with a PMH as outlined below.  [facility] was [facility] ED [facility]/18 with weakness and lethargy x a few weeks. Has also had decreased appetite and cough.  He has a caregiver at home and caregiver stated that along with cough, he would occasionally choke; therefore, was concerned about aspiration.  Symptoms had gotten progressively worse and on 7/18, caregiver was unable to get him off of the bathroom commode; therefore, called EMS. He had CXR which demonstrated near complete opacification of the left hemithorax with compressive atelectasis.  CT was also obtained and demonstrated large loculated left pleural effusion with compressive atelectasis, small right effusion.  PCCM was asked to see in consultation.  PAST MEDICAL HISTORY :   has a past medical history of Arthropathy, unspecified, site unspecified; BPH with obstruction/lower urinary tract symptoms; Cataract; Cerebrovascular disease, unspecified; Chronic renal insufficiency, stage II (mild); Chronic rhinitis; Chronic venous insufficiency; Coronary atherosclerosis of unspecified type of vessel, native or graft; DEPRESSION (12/06/2006); Depressive disorder, not elsewhere classified; Hearing loss of both ears; History of actinic keratoses (08/2011); History of iron deficiency anemia (2013); Lichen simplex chronicus (08/2011); Lumbar degenerative disc disease (06/2009); Memory loss; Osteoarthritis (06/2009); Other and unspecified hyperlipidemia; Rectal bleeding (2015); TRANSIENT ISCHEMIC ATTACK, HX OF (12/06/2006); Urticarial dermatitis; and Venous stasis dermatitis, left (07/2007).  has a past surgical history that includes Appendectomy; Coronary angioplasty with stent; Tonsillectomy; Cataract extraction (10/24/2007); Cataract  extraction (11/07/2007); and Anterior approach hemi hip arthroplasty (Right, 08/24/2015). Prior to Admission medications   Medication Sig Start Date End Date Taking? Authorizing Provider  acetaZOLAMIDE (DIAMOX) 250 MG tablet ALTERNATE 2 TABLETS BY MOUTH EVERY DAY WITH 1 TABLET BY MOUTH EVERY DAY. 05/14/14   McGowen, Maryjean MornPhilip H, MD  citalopram (CELEXA) 20 MG tablet Take 20 mg by mouth daily. 08/16/15   [provider]  ferrous sulfate 325 (65 FE) MG tablet Take 325 mg by mouth 2 (two) times daily.    [provider]  fluticasone (FLONASE) 50 MCG/ACT nasal spray INSTILL 2 SPRAYS INTO EACH NOSTRIL DAILY 01/11/14   McGowen, Maryjean MornPhilip H, MD  HYDROcodone-acetaminophen (NORCO/VICODIN) 5-325 MG tablet Take 1 tablet by mouth every 6 (six) hours as needed for moderate pain or severe pain. 08/29/15   Cammy Copaean, Scott Gregory, MD  metoprolol tartrate (LOPRESSOR) 25 MG tablet Take 1 tablet (25 mg total) by mouth 2 (two) times daily. 08/30/15   Renae FickleShort, Mackenzie, MD  pantoprazole (PROTONIX) 40 MG tablet Take 1 tablet (40 mg total) by mouth daily. 04/30/14   McGowen, Maryjean MornPhilip H, MD  polycarbophil (FIBERCON) 625 MG tablet Take 625 mg by mouth daily.    [provider]  Potassium Gluconate 550 MG TABS Take 1 tablet by mouth daily.    [provider]  rivaroxaban (XARELTO) 20 MG TABS tablet Take 1 tablet (20 mg total) by mouth daily with breakfast. 08/30/15   Renae FickleShort, Mackenzie, MD  simvastatin (ZOCOR) 80 MG tablet TAKE 1 TABLET BY MOUTH AT BEDTIME 06/13/14   McGowen, Maryjean MornPhilip H, MD  tamsulosin (FLOMAX) 0.4 MG CAPS capsule Take 1 capsule (0.4 mg total) by mouth daily. 04/16/14   McGowen, Maryjean MornPhilip H, MD  triamcinolone (KENALOG) 0.025 % cream Apply 2-3 times daily to skin eruptions, hives    [provider]  Allergies  Allergen Reactions  . Grapefruit Concentrate Other (See Comments)    Cannot take with several of his medications  . Amoxicillin Rash  . Penicillins Rash    Has patient had a PCN  reaction causing immediate rash, facial/tongue/throat swelling, SOB or lightheadedness with hypotension: Yes Has patient had a PCN reaction causing severe rash involving mucus membranes or skin necrosis: Unknown Has patient had a PCN reaction that required hospitalization: Unknown Has patient had a PCN reaction occurring within the last 10 years: No If all of the above answers are "NO", then may proceed with Cephalosporin use.     FAMILY HISTORY:  family history includes Dementia in his sister; Heart disease (age of onset: 23) in his father; Hypertension in his mother. SOCIAL HISTORY:  reports that he has quit smoking. His smoking use included Pipe. He has never used smokeless tobacco. He reports that he drinks about 10.5 oz of alcohol per week . He reports that he does not use drugs.  REVIEW OF SYSTEMS:   All negative; except for those that are bolded, which indicate positives.  Constitutional: weight loss, weight gain, night sweats, fevers, chills, fatigue, weakness.  HEENT: headaches, sore throat, sneezing, nasal congestion, post nasal drip, difficulty swallowing, tooth/dental problems, visual complaints, visual changes, ear aches. Neuro: difficulty with speech, weakness, numbness, ataxia. CV:  chest pain, orthopnea, PND, swelling in lower extremities, dizziness, palpitations, syncope.  Resp: cough, hemoptysis, dyspnea, wheezing. GI: heartburn, indigestion, abdominal pain, nausea, vomiting, diarrhea, constipation, change in bowel habits, loss of appetite, hematemesis, melena, hematochezia.  GU: dysuria, change in color of urine, urgency or frequency, flank pain, hematuria. MSK: joint pain or swelling, decreased range of motion. Psych: change in mood or affect, depression, anxiety, suicidal ideations, homicidal ideations. Skin: rash, itching, bruising.   SUBJECTIVE:   Breathing comfortably on 2L O2.  Denies SOB or chest pain.  VITAL SIGNS: Temp:  [97.8 F (36.6 C)-99.8 F (37.7 C)]  99.8 F (37.7 C) (07/18 0319) Pulse Rate:  [51-152] 51 (07/18 0500) Resp:  [24-32] 25 (07/18 0500) BP: (99-130)/(78-99) 130/97 (07/18 0500) SpO2:  [95 %-100 %] 100 % (07/18 0500) Weight:  [81.6 kg (180 lb)] 81.6 kg (180 lb) (07/18 0355)  PHYSICAL EXAMINATION: General: Adult male, resting in bed, in NAD. Hard of hearing. Neuro: A&O x 3, non-focal.  HEENT: [place]/AT. PERRL, sclerae anicteric. Cardiovascular: RRR, no M/R/G.  Lungs: Respirations even and unlabored.  Diminished left hemithorax. Abdomen: BS x 4, soft, NT/ND.  Musculoskeletal: No gross deformities, no edema.  Skin: Intact, warm, no rashes.    Recent Labs Lab 07/12/2016 0324  NA 140  K 5.1  CL 106  CO2 21*  BUN 53*  CREATININE 1.75*  GLUCOSE 122*    Recent Labs Lab 07/06/2016 0324  HGB 13.2  HCT 43.3  WBC 6.5  PLT 177   Dg Chest Port 1 View  Result Date: 07/27/2016 CLINICAL DATA:  83 year old male with shortness of breath and altered mental status. EXAM: PORTABLE CHEST 1 VIEW COMPARISON:  Chest radiograph dated 05/29/2016 FINDINGS: There is near complete opacification of the left hemithorax with only a very small aerated portion of the lung in the upper lobes. This findings is new compared to the prior radiograph. Right lung base atelectatic changes with probable small right pleural effusion. There is no pneumothorax. The left cardiac borders are silhouetted. There is atherosclerotic calcification of the aortic arch. There is no mediastinal shift. No acute osseous pathology. IMPRESSION: Near complete opacification of the left hemithorax,  new from prior radiograph likely representing enlarged pleural effusion and associated compressive atelectasis of the lung. Superimposed pneumonia or a central/ perihilar mass is not entirely excluded. Clinical correlation and follow-up to resolution recommended. Probable trace right pleural effusion and minimal right lung base atelectatic changes. Electronically Signed   By: Elgie Collard M.D.   On: 08-03-16 03:33    STUDIES:  CXR 7/18 > near complete opacification of the left hemithorax with compressive atelectasis. CT chest 7/18 > large loculated left pleural effusion with compressive atelectasis, small right effusion  SIGNIFICANT EVENTS  7/18 > admit.  ASSESSMENT / PLAN:  Left pleural effusion with loculations and compressive atelectasis. Small right pleural effusion. Plan: OK to admit to SDU. Continue empiric abx. Needs CVTS consult given multiple loculations. Bronchial hygiene. Follow CXR.  Sinus tach. Plan: Defer treatment for now given borderline BP's upon arrival to ED (responded to IVF's, BP normal now). If persists, can try low dose lopressor.   Global:  Need to address goals of care; however, no family present.  Caregiver was at bedside earlier but no longer is.  Perhaps if caregiver returns, can see if they have contact for any family members.   Rutherford Guys, [place] [facility] Pager: [phone]  or [phone] 2016/08/03, 5:23 AM  Attending Note:  83 year old male presenting with SOB.  CXR was performed and revealed a complete opacification of the left lung.  I reviewed chest CT myself, loculated effusions noted on the left with compressive atelectasis.  On exam, no BS are audible on the left and clear on the right.  Discussed with PCCM-NP.  Pleural effusion, concern for empyema  - Recommend CVTS consult vs IR to direct placement of tubes  - Pan culture  - Cefepime  - Vanc  - F/U on cultures  Atelectasis:  - IS per RT protocol  - May need evacuation of pleural space  Hypoxemia:  - Titrate O2 for sat of 88-92%  Sinus tach:  - Tele monitoring.  GOC: may want to consider involvement of palliative care for goals of care discussion prior to aggressive surgical interventions.  PCCM will be available PRN.  Patient seen and examined, agree with above note.  I dictated the care and  orders written for this patient under my direction.  Alyson Reedy, MD [phone]

## 2016-07-22 NOTE — ED Notes (Signed)
Pt has removed all monitoring equipment and IV. Pt states he would rather die than be in here. Pt continues to demand to stand up.

## 2016-07-23 ENCOUNTER — Inpatient Hospital Stay (HOSPITAL_COMMUNITY): Payer: Medicare Other

## 2016-07-23 LAB — COMPREHENSIVE METABOLIC PANEL
ALT: 18 U/L (ref 17–63)
ANION GAP: 13 (ref 5–15)
AST: 30 U/L (ref 15–41)
Albumin: 2 g/dL — ABNORMAL LOW (ref 3.5–5.0)
Alkaline Phosphatase: 85 U/L (ref 38–126)
BILIRUBIN TOTAL: 1.3 mg/dL — AB (ref 0.3–1.2)
BUN: 61 mg/dL — ABNORMAL HIGH (ref 6–20)
CHLORIDE: 111 mmol/L (ref 101–111)
CO2: 17 mmol/L — ABNORMAL LOW (ref 22–32)
Calcium: 7.8 mg/dL — ABNORMAL LOW (ref 8.9–10.3)
Creatinine, Ser: 1.81 mg/dL — ABNORMAL HIGH (ref 0.61–1.24)
GFR calc Af Amer: 36 mL/min — ABNORMAL LOW (ref >60)
GFR, EST NON AFRICAN AMERICAN: 31 mL/min — AB (ref >60)
GLUCOSE: 126 mg/dL — AB (ref 65–99)
POTASSIUM: 5 mmol/L (ref 3.5–5.1)
Sodium: 141 mmol/L (ref 135–145)
TOTAL PROTEIN: 5.8 g/dL — AB (ref 6.5–8.1)

## 2016-07-23 LAB — CBC WITH DIFFERENTIAL/PLATELET
BASOS ABS: 0 10*3/uL (ref 0.0–0.1)
Basophils Relative: 0 %
EOS PCT: 0 %
Eosinophils Absolute: 0 10*3/uL (ref 0.0–0.7)
HEMATOCRIT: 45.7 % (ref 39.0–52.0)
HEMOGLOBIN: 14 g/dL (ref 13.0–17.0)
LYMPHS PCT: 12 %
Lymphs Abs: 0.9 10*3/uL (ref 0.7–4.0)
MCH: 27.3 pg (ref 26.0–34.0)
MCHC: 30.6 g/dL (ref 30.0–36.0)
MCV: 89.1 fL (ref 78.0–100.0)
MONOS PCT: 5 %
Monocytes Absolute: 0.4 10*3/uL (ref 0.1–1.0)
Neutro Abs: 6.2 10*3/uL (ref 1.7–7.7)
Neutrophils Relative %: 83 %
Platelets: 124 10*3/uL — ABNORMAL LOW (ref 150–400)
RBC: 5.13 MIL/uL (ref 4.22–5.81)
RDW: 16.5 % — ABNORMAL HIGH (ref 11.5–15.5)
WBC: 7.5 10*3/uL (ref 4.0–10.5)

## 2016-07-23 LAB — URINALYSIS, ROUTINE W REFLEX MICROSCOPIC
Bilirubin Urine: NEGATIVE
GLUCOSE, UA: NEGATIVE mg/dL
Hgb urine dipstick: NEGATIVE
Ketones, ur: NEGATIVE mg/dL
Leukocytes, UA: NEGATIVE
NITRITE: NEGATIVE
PH: 5 (ref 5.0–8.0)
PROTEIN: 30 mg/dL — AB
Specific Gravity, Urine: 1.024 (ref 1.005–1.030)

## 2016-07-23 LAB — STREP PNEUMONIAE URINARY ANTIGEN: Strep Pneumo Urinary Antigen: NEGATIVE

## 2016-07-23 LAB — MAGNESIUM: MAGNESIUM: 2.4 mg/dL (ref 1.7–2.4)

## 2016-07-23 LAB — LACTIC ACID, PLASMA
LACTIC ACID, VENOUS: 4.4 mmol/L — AB (ref 0.5–1.9)
Lactic Acid, Venous: 3.7 mmol/L (ref 0.5–1.9)

## 2016-07-23 LAB — SODIUM, URINE, RANDOM: Sodium, Ur: <10 mmol/L

## 2016-07-23 LAB — CREATININE, URINE, RANDOM: CREATININE, URINE: 139.63 mg/dL

## 2016-07-23 LAB — PROTIME-INR
INR: 1.61
PROTHROMBIN TIME: 19.3 s — AB (ref 11.4–15.2)

## 2016-07-23 MED ORDER — LORAZEPAM 2 MG/ML PO CONC: 1.0000 mg | ORAL | Status: DC | PRN

## 2016-07-23 MED ORDER — SODIUM CHLORIDE 0.9 % IV BOLUS (SEPSIS)
500.0000 mL | Freq: Once | INTRAVENOUS | Status: AC
  Administered 2016-07-23: 500 mL via INTRAVENOUS

## 2016-07-23 MED ORDER — POLYVINYL ALCOHOL 1.4 % OP SOLN: 1.0000 [drp] | Freq: Four times a day (QID) | OPHTHALMIC | Status: DC | PRN

## 2016-07-23 MED ORDER — ONDANSETRON HCL 4 MG/2ML IJ SOLN
4.0000 mg | Freq: Four times a day (QID) | INTRAMUSCULAR | Status: DC | PRN
  Administered 2016-07-23: 4 mg via INTRAVENOUS
  Filled 2016-07-23: qty 2

## 2016-07-23 MED ORDER — MORPHINE SULFATE 25 MG/ML IV SOLN
1.0000 mg/h | INTRAVENOUS | Status: DC
  Filled 2016-07-23: qty 10

## 2016-07-23 MED ORDER — HALOPERIDOL LACTATE 2 MG/ML PO CONC
0.5000 mg | ORAL | Status: DC | PRN
  Filled 2016-07-23: qty 0.3

## 2016-07-23 MED ORDER — HALOPERIDOL 0.5 MG PO TABS: 0.5000 mg | ORAL_TABLET | ORAL | Status: DC | PRN

## 2016-07-23 MED ORDER — MORPHINE SULFATE (PF) 4 MG/ML IV SOLN
2.0000 mg | INTRAVENOUS | Status: DC | PRN
  Administered 2016-07-23: 2 mg via INTRAVENOUS
  Filled 2016-07-23: qty 1

## 2016-07-23 MED ORDER — ATROPINE SULFATE 1 % OP SOLN: 4.0000 [drp] | OPHTHALMIC | Status: DC | PRN

## 2016-07-23 MED ORDER — LORAZEPAM 2 MG/ML IJ SOLN
1.0000 mg | INTRAMUSCULAR | Status: DC | PRN
  Administered 2016-07-23: 1 mg via INTRAVENOUS

## 2016-07-23 MED ORDER — HALOPERIDOL LACTATE 5 MG/ML IJ SOLN: 0.5000 mg | INTRAMUSCULAR | Status: DC | PRN

## 2016-07-23 MED ORDER — LORAZEPAM 1 MG PO TABS: 1.0000 mg | ORAL_TABLET | ORAL | Status: DC | PRN

## 2016-07-23 MED ORDER — LORAZEPAM 2 MG/ML IJ SOLN
1.0000 mg | INTRAMUSCULAR | Status: DC | PRN
  Filled 2016-07-23: qty 1

## 2016-07-23 MED ORDER — ONDANSETRON 4 MG PO TBDP: 4.0000 mg | ORAL_TABLET | Freq: Four times a day (QID) | ORAL | Status: DC | PRN

## 2016-07-23 MED ORDER — DIPHENHYDRAMINE HCL 50 MG/ML IJ SOLN: 12.5000 mg | INTRAMUSCULAR | Status: DC | PRN

## 2016-07-24 LAB — URINE CULTURE: Culture: <10000 — AB

## 2016-07-24 LAB — PROTEIN, BODY FLUID (OTHER): Total Protein, Body Fluid Other: 1.7 g/dL

## 2016-07-24 LAB — PH, BODY FLUID: pH, Body Fluid: 7

## 2016-07-24 LAB — HIV ANTIBODY (ROUTINE TESTING W REFLEX): HIV SCREEN 4TH GENERATION: NONREACTIVE

## 2016-07-24 LAB — GLUCOSE, BODY FLUID OTHER

## 2016-07-25 LAB — LEGIONELLA PNEUMOPHILA SEROGP 1 UR AG: L. pneumophila Serogp 1 Ur Ag: NEGATIVE

## 2016-07-27 LAB — CULTURE, BLOOD (ROUTINE X 2)
CULTURE: NO GROWTH
Culture: NO GROWTH
SPECIAL REQUESTS: ADEQUATE
Special Requests: ADEQUATE

## 2016-07-28 LAB — LD, BODY FLUID (OTHER): LD, Body Fluid: 2325 IU/L

## 2016-07-29 LAB — BODY FLUID CULTURE

## 2016-08-05 NOTE — Progress Notes (Signed)
CRITICAL VALUE ALERT  Critical Value:  Lactic Acid 4.4  Date & Time Notied:  0840 7/19  Provider Notified: Allena KatzPatel, MD  Orders Received/Actions taken: awaiting new orders

## 2016-08-05 NOTE — Progress Notes (Signed)
CRITICAL VALUE ALERT  Critical Value:  Lactic acid 3.7  Date & Time Notied:  10/31/16 5:07 AM   Provider Notified: Clearence PedSchorr, NP  Orders Received/Actions taken:

## 2016-08-05 NOTE — Progress Notes (Addendum)
CRITICAL VALUE ALERT  Critical Value:  Lactic acid 3.5   Date & Time Notied:  08/02/2016 12:03 AM   Provider Notified: Schorr, NP  Orders Received/Actions taken: 500 ml NS bolus

## 2016-08-05 NOTE — Discharge Summary (Signed)
 DEATH SUMMARY   Patient Details  Name: Jeff Hunt MRN: [id number] DOB: 31-Dec-1925  Admission/Discharge Information   Admit Date:  2016/08/15  Date of Death: Date of Death: August 16, 2016  Time of Death: Time of Death: 1004  Length of Stay: 1  Referring Physician: [name], MD   Reason(s) for Hospitalization  sepsis  Diagnoses  Preliminary cause of death:  Secondary Diagnoses (including complications and co-morbidities):  Principal Problem:   Sepsis (HCC) Active Problems:   Coronary artery disease due to lipid rich plaque   Essential hypertension   Pressure ulcer   Atrial fibrillation with RVR (HCC)   Deaf   AKI (acute kidney injury) (HCC)   Pleural effusion, left   Community acquired pneumonia of left lower lobe of lung [facility])   Brief Hospital Course (including significant findings, care, treatment, and services provided and events leading to death)  Jeff Hunt is a 83 y.o. year old male who Initially presented with sepsis due to community-acquired pneumonia most likely an aspiration pneumonia. Critical care was consulted. Thoracentesis was performed which was showing frank pus. Culture grew Streptococcus viridans. Patient was given broad-spectrum antibiotics and IV hydration. Over the period of time patient's condition progressively worsened. Patient was felt not a candidate for VATS by cardiothoracic surgery for his empyema. Cardiology also felt that patient's rate control for his a flutter with RVR given his other comorbidities as well as hypotension and sepsis would be difficult. Patient has progressive decline over last few months per my discussion with patient's daughter. Discussed with Ms. Nedra Hai was patient's surrogate daughter and Mrs. [name] was patient's daughter, both of whom are power of attorney for the patient. Based on the discussion, patient was transitioned to comfort care. At 10:04 AM on Aug 16, 2016 the patient was pronounced deceased by RN.  1.  Sepsis due to left pleural effusion, aspiration pneumonia as well as empyema. Critical care consulted, IR guided thoracentesis performed which was showing frank pus. Postprocedure the patient has left-sided hydropneumothorax and which basically all the fluid that was removed is replaced by air. 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 A cardiac thoracic surgery consulted. Felt not a candidate for VATS.  2. Dysphagia. Depression. Dementia with agitation.  At his baseline the patient has difficulty swallowing per family. Recently as per mentioned by [name], the patient actually vocalized that he would try to kill himself by aspirating. Also denied any suicidal ideation.  3. Atrial flutter with 2:1 block. Patient was on scheduled Lopressor.  Cardiology consulted for further input.  Pertinent Labs and Studies  Significant Diagnostic Studies Dg Chest 1 View  Result Date: 15-Aug-2016 CLINICAL DATA:  Status post left thoracentesis EXAM: CHEST 1 VIEW COMPARISON:  Chest radiograph from earlier today. FINDINGS: Stable cardiomediastinal silhouette with normal heart size. There is a moderate to large lateral basilar left hydropneumothorax with significant reduction in the pleural fluid component status post thoracentesis. No right pneumothorax. Slight right mediastinal shift is not definitely changed accounting for slight rightward rotation. Stable volume loss and compressive atelectasis throughout the mid to lower left lung. No pulmonary edema. IMPRESSION: 1. Moderate to large lateral basilar left hydropneumothorax, with replacement of pleural fluid by pleural air status post thoracentesis. 2. Stable volume loss and compressive atelectasis throughout the mid to lower left lung, suggesting trapped lung. 3. Stable slight right mediastinal shift. Critical Value/emergent results were called by telephone at the time of interpretation on 08/15/2016 at 1:08 pm to Dr. Allena Katz, who verbally  acknowledged these results. Electronically Signed   By: Delbert Phenix M.D.   On: 07/30/2016 13:13   Ct Chest Wo Contrast  Result Date: 07/30/2016 CLINICAL DATA:  83 year old male with opacification of the left hemithorax. EXAM: CT CHEST WITHOUT CONTRAST TECHNIQUE: Multidetector CT imaging of the chest was performed following the standard protocol without IV contrast. COMPARISON:  Chest radiograph dated  FINDINGS: Evaluation of this exam is limited in the absence of intravenous contrast. Evaluation is also limited due to streak artifact caused by patient's arms. Cardiovascular: There is no cardiomegaly or pericardial effusion. Multi vessel coronary vascular calcifications primarily involving the LAD, RCA, and left circumflex artery. There is moderate atherosclerotic calcification of thoracic aorta. There is mild dilatation of the aortic arch measuring approximately 4 cm a diameter. Evaluation of the aorta is limited in the absence of intravenous contrast. There is enlargement of the main pulmonary trunk indicative of underlying pulmonary hypertension. Mediastinum/Nodes: There is no hilar or mediastinal adenopathy. Evaluation of the left hilum is limited due to atelectatic changes of the adjacent lung and noncontrast nature of the study. There is mild shift of the mediastinum to the right hemithorax. The esophagus and the thyroid gland are grossly unremarkable. Lungs/Pleura: There is a small right pleural effusion. Large multiloculated appearing left pleural effusion occupying the majority of the left hemithorax. There is associated compressive atelectasis of the left lung with small area of aerated lung in the left upper lobe. Patchy area of ground-glass and hazy density in the left upper lobe likely atelectatic changes although infiltrate is not excluded. Right lung base atelectatic changes versus infiltrate. There is no pneumothorax. There is compression of the left upper and left lower lobe bronchi.  Upper Abdomen: There is colonic diverticulosis. Musculoskeletal: Diffuse subcutaneous edema and anasarca. Degenerative changes of the spine. There is compression fracture of the L1 vertebra with approximately 50% loss of vertebral body height and anterior wedging, seen on the radiograph of 12/10/2015. No definite acute fracture. IMPRESSION: 1. Large multiloculated left pleural effusion occupying the majority of the left hemithorax. There is associated mass effect and compressive atelectasis of the majority of the left lung with a small aerated portion in the left upper lobe. 2. Compression of the left upper and left lower lobe bronchi secondary to mass effect caused by loculated left pleural effusion. There is also mild shift of the mediastinum to the right. 3. Small right pleural effusion with right lung base atelectasis versus infiltrate. 4. Multi vessel coronary vascular disease. 5.  Aortic Atherosclerosis (ICD10-I70.0). 6. Mild dilatation of the aortic arch measuring up to 4 cm. 7. Enlarged pulmonary arteries indicative of underlying pulmonary hypertension. 8. Chronic appearing L1 compression fracture. Correlation with clinical exam and point tenderness recommended. Electronically Signed   By: Elgie Collard M.D.   On: 07/21/2016 06:06   Dg Chest Port 1 View  Result Date: 07-30-2016 CLINICAL DATA:  Respiratory failure EXAM: PORTABLE CHEST 1 VIEW COMPARISON:  07/16/2016 FINDINGS: Cardiac shadow is again mildly enlarged. The right lung remains predominately clear with the exception of a small pleural effusion and minimal basilar atelectasis. There has been thoracentesis on the left with replacement of the pleural space with air. Some residual fluid remains. This is related to incomplete re-expansion of the lung as posterior true pneumothorax. Compressive atelectasis in the left base is again seen. IMPRESSION: Stable appearance of the chest when compared with the prior exam. No significant re-expansion of the  left lung is noted. Electronically Signed   By: Loraine Leriche  Lukens M.D.   On: Aug 16, 2016 08:21   Dg Chest Port 1 View  Result Date: 07/09/2016 CLINICAL DATA:  83 year old male with shortness of breath and altered mental status. EXAM: PORTABLE CHEST 1 VIEW COMPARISON:  Chest radiograph dated 05/29/2016 FINDINGS: There is near complete opacification of the left hemithorax with only a very small aerated portion of the lung in the upper lobes. This findings is new compared to the prior radiograph. Right lung base atelectatic changes with probable small right pleural effusion. There is no pneumothorax. The left cardiac borders are silhouetted. There is atherosclerotic calcification of the aortic arch. There is no mediastinal shift. No acute osseous pathology. IMPRESSION: Near complete opacification of the left hemithorax, new from prior radiograph likely representing enlarged pleural effusion and associated compressive atelectasis of the lung. Superimposed pneumonia or a central/ perihilar mass is not entirely excluded. Clinical correlation and follow-up to resolution recommended. Probable trace right pleural effusion and minimal right lung base atelectatic changes. Electronically Signed   By: Elgie Collard M.D.   On: 07/17/2016 03:33   Ir Thoracentesis Asp Pleural Space W/img Guide  Result Date: 07/07/2016 INDICATION: Weakness, cough. Left pleural effusion. Request for diagnostic and therapeutic thoracentesis. EXAM: ULTRASOUND GUIDED LEFT THORACENTESIS MEDICATIONS: 1% Lidocaine = 10 mL COMPLICATIONS: None immediate. PROCEDURE: An ultrasound guided thoracentesis was thoroughly discussed with the patient and questions answered. The benefits, risks, alternatives and complications were also discussed. The patient understands and wishes to proceed with the procedure. Written consent was obtained. Ultrasound was performed to localize and mark an adequate pocket of fluid in the left chest. The area was then prepped and  draped in the normal sterile fashion. 1% Lidocaine was used for local anesthesia. Under ultrasound guidance a 6 Fr Safe-T-Centesis catheter was introduced. Thoracentesis was performed. The catheter was removed and a dressing applied. FINDINGS: A total of approximately 400 mL of thick green purulent fluid was removed. Samples were sent to the laboratory as requested by the clinical team. IMPRESSION: Successful ultrasound guided left thoracentesis yielding 400 mL of pleural fluid. Post procedure chest X-ray revealed moderate to large lateral basilar left hydropneumothorax, with replacement of pleural fluid by pleural air. Cardiothoracic surgery has been consulted. Read by:  Corrin Parker, PA-C Electronically Signed   By: Judie Petit.  Shick M.D.   On: 07/29/2016 15:09    Microbiology Recent Results (from the past 240 hour(s))  Culture, blood (Routine x 2)     Status: None   Collection Time: 07/26/2016  3:25 AM  Result Value Ref Range Status   Specimen Description BLOOD RIGHT ANTECUBITAL  Final   Special Requests   Final    BOTTLES DRAWN AEROBIC AND ANAEROBIC Blood Culture adequate volume   Culture NO GROWTH 5 DAYS  Final   Report Status 07/27/2016 FINAL  Final  Culture, blood (Routine x 2)     Status: None   Collection Time: 08/02/2016  3:50 AM  Result Value Ref Range Status   Specimen Description BLOOD LEFT ARM  Final   Special Requests IN PEDIATRIC BOTTLE Blood Culture adequate volume  Final   Culture NO GROWTH 5 DAYS  Final   Report Status 07/27/2016 FINAL  Final  MRSA PCR Screening     Status: None   Collection Time: 07/24/2016  6:40 AM  Result Value Ref Range Status   MRSA by PCR NEGATIVE NEGATIVE Final    Comment:        The GeneXpert MRSA Assay (FDA approved for NASAL specimens only), is one component  of a comprehensive MRSA colonization surveillance program. It is not intended to diagnose MRSA infection nor to guide or monitor treatment for MRSA infections.   Body fluid culture     Status:  None (Preliminary result)   Collection Time: 07/21/2016 12:47 PM  Result Value Ref Range Status   Specimen Description FLUID LEFT PLEURAL  Final   Special Requests NONE  Final   Gram Stain   Final    ABUNDANT WBC PRESENT,BOTH PMN AND MONONUCLEAR FEW GRAM NEGATIVE COCCOBACILLI    Culture   Final    MODERATE VIRIDANS STREPTOCOCCUS HOLDING FOR POSSIBLE ANAEROBE    Report Status PENDING  Incomplete   Organism ID, Bacteria VIRIDANS STREPTOCOCCUS  Final      Susceptibility   Viridans streptococcus - MIC*    ERYTHROMYCIN >=8 RESISTANT Resistant     LEVOFLOXACIN 0.5 SENSITIVE Sensitive     VANCOMYCIN 0.5 SENSITIVE Sensitive     * MODERATE VIRIDANS STREPTOCOCCUS  Urine culture     Status: Abnormal   Collection Time: 2016-01-27  1:44 AM  Result Value Ref Range Status   Specimen Description URINE, RANDOM  Final   Special Requests NONE  Final   Culture <10,000 COLONIES/mL INSIGNIFICANT GROWTH (A)  Final   Report Status 07/24/2016 FINAL  Final    Lab Basic Metabolic Panel:  Recent Labs Lab 07/10/2016 0324 2016-01-27 0351  NA 140 141  K 5.1 5.0  CL 106 111  CO2 21* 17*  GLUCOSE 122* 126*  BUN 53* 61*  CREATININE 1.75* 1.81*  CALCIUM 8.2* 7.8*  MG  --  2.4   Liver Function Tests:  Recent Labs Lab 08/03/2016 0324 2016-01-27 0351  AST 28 30  ALT 15* 18  ALKPHOS 91 85  BILITOT 1.5* 1.3*  PROT 6.1* 5.8*  ALBUMIN 2.1* 2.0*   No results for input(s): LIPASE, AMYLASE in the last 168 hours. No results for input(s): AMMONIA in the last 168 hours. CBC:  Recent Labs Lab 07/10/2016 0324 2016-01-27 0351  WBC 6.5 7.5  NEUTROABS 5.0 6.2  HGB 13.2 14.0  HCT 43.3 45.7  MCV 89.6 89.1  PLT 177 124*   Cardiac Enzymes: No results for input(s): CKTOTAL, CKMB, CKMBINDEX, TROPONINI in the last 168 hours. Sepsis Labs:  Recent Labs Lab 07/27/2016 0324  07/30/2016 1938 07/19/2016 2311 2016-01-27 0351 2016-01-27 0729  WBC 6.5  --   --   --  7.5  --   LATICACIDVEN  --   < > 3.9* 3.5* 3.7* 4.4*  < >  = values in this interval not displayed.  Procedures/Operations  bipap   Gabriell Casimir 07/28/2016, 1:41 PM

## 2016-08-05 NOTE — Consult Note (Addendum)
WOC Nurse wound consult note Reason for Consult: Consult requested for buttocks/sacrum Wound type: Stage 2 pressure injury, noted as present on admission on nursing assessment flow sheet Pressure Injury POA: Yes Measurement: .8X.8X.1cm Wound bed: dark purple-red to right buttock Drainage (amount, consistency, odor) Scant amt tan drainage, no odor Periwound: Intact skin surrounding Dressing procedure/placement/frequency: Foam dressing to protect and promote healing.  Discussed plan of care with family member at the bedside. Please re-consult if further assistance is needed.  Thank-you,  Cammie Mcgeeawn Sujay Grundman MSN, RN, CWOCN, [place]-AP, CNS [phone]

## 2016-08-05 NOTE — Progress Notes (Signed)
Patient states that "he can't breathe"  while 5L HFNC O2 sats 93% and RR 38, BIPAP applied which patient could not tolerate. RT & MD paged.

## 2016-08-05 NOTE — Care Management Note (Signed)
Case Management Note  Patient Details  Name: Jeff Hunt MRN: [id number] Date of Birth: 02/19/1925  Subjective/Objective:   Expired.                 Action/Plan:   Expected Discharge Date:                  Expected Discharge Plan:     In-House Referral:     Discharge planning Services  CM Consult  Post Acute Care Choice:    Choice offered to:     DME Arranged:    DME Agency:     HH Arranged:    HH Agency:     Status of Service:  Completed, signed off  If discussed at [employer] Length of Stay Meetings, dates discussed:    Additional Comments:  [name], [name], RN 25-Mar-2016, 2:18 PM

## 2016-08-05 DEATH — deceased

## 2018-07-08 IMAGING — CR DG CHEST 1V PORT
1 series · 1 of 1 positions shown · non-contrast
Comparison: 05/25/2011

CLINICAL DATA: Fall

EXAM:
PORTABLE CHEST 1 VIEW

[AP]
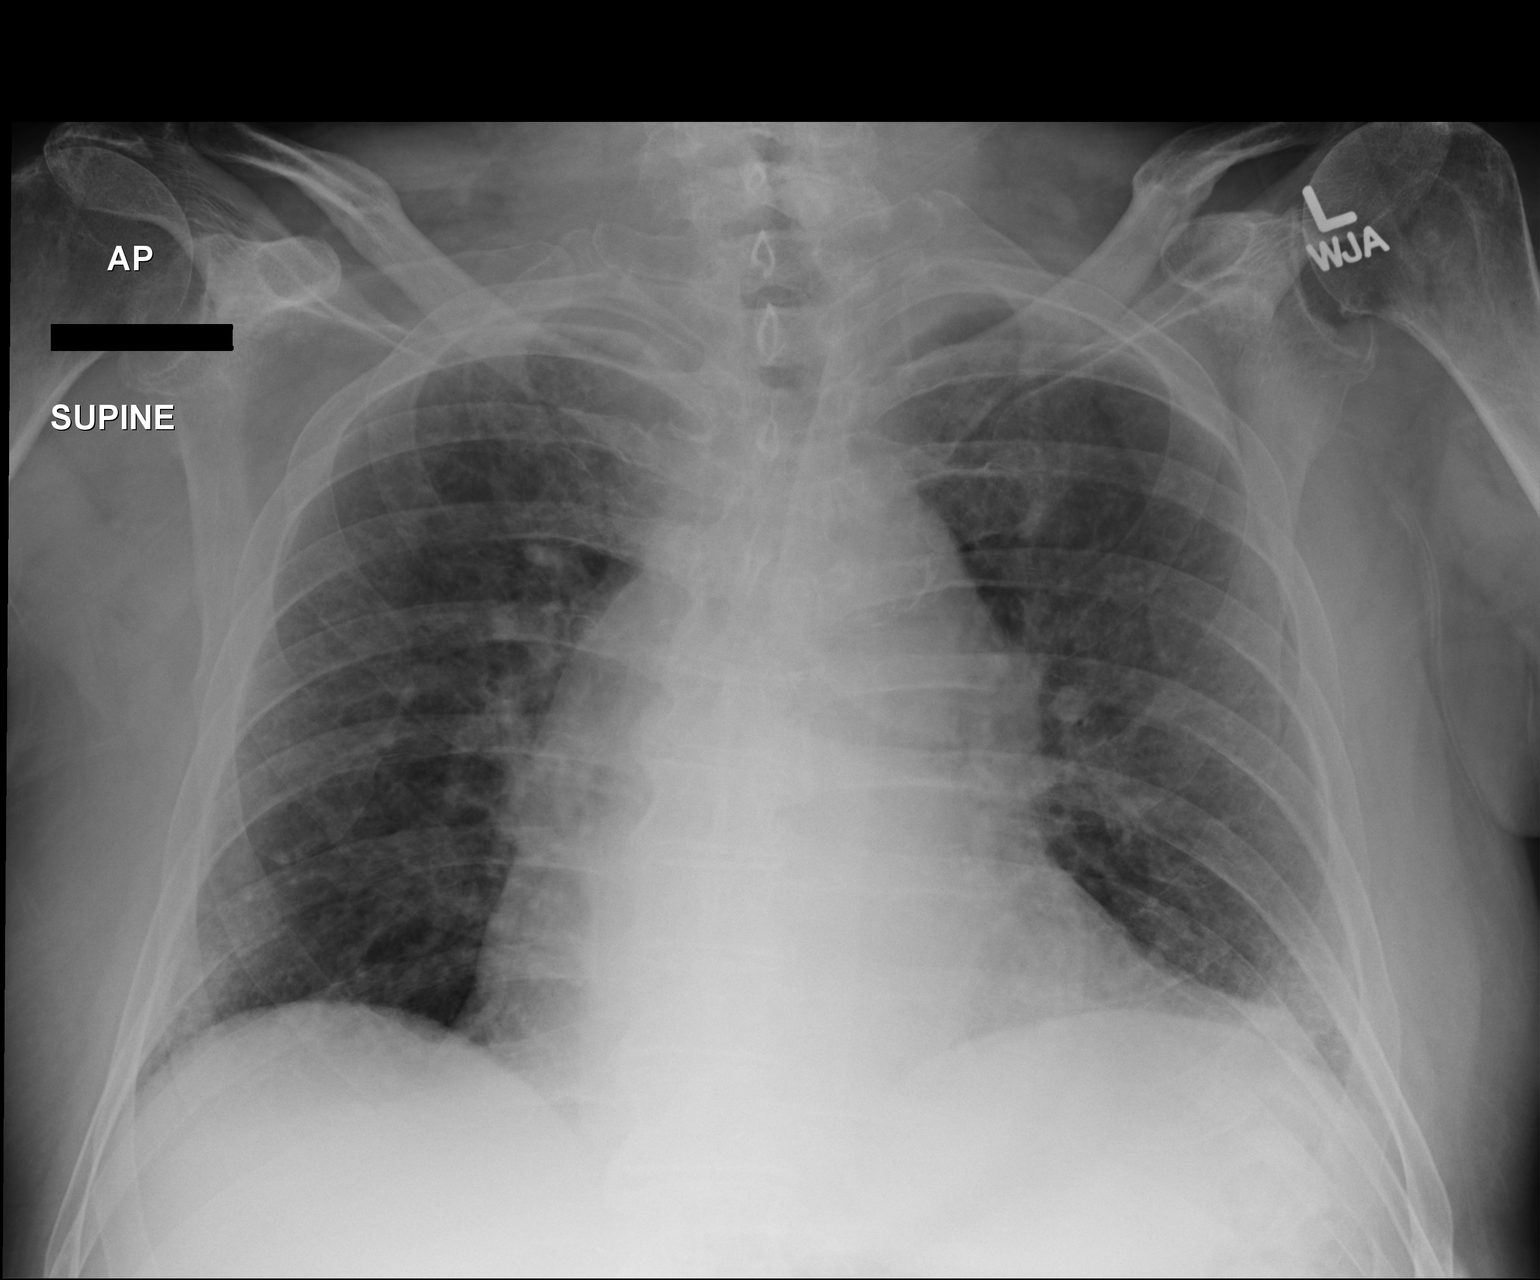

[1 of 1 positions shown; findings below may reference images not displayed]

FINDINGS: Cardiomediastinal silhouette is stable. No acute infiltrate or
pleural effusion. No pulmonary edema. Stable left basilar scarring.
IMPRESSION: No active disease.

## 2018-07-08 IMAGING — DX DG HIP (WITH OR WITHOUT PELVIS) 2-3V*R*
3 series · 3 of 3 positions shown · non-contrast
Comparison: None.

CLINICAL DATA: Fall this afternoon while taking out the trash.
Acute onset of right hip pain.

EXAM:
DG HIP (WITH OR WITHOUT PELVIS) 2-3V RIGHT

[pelvis ap]
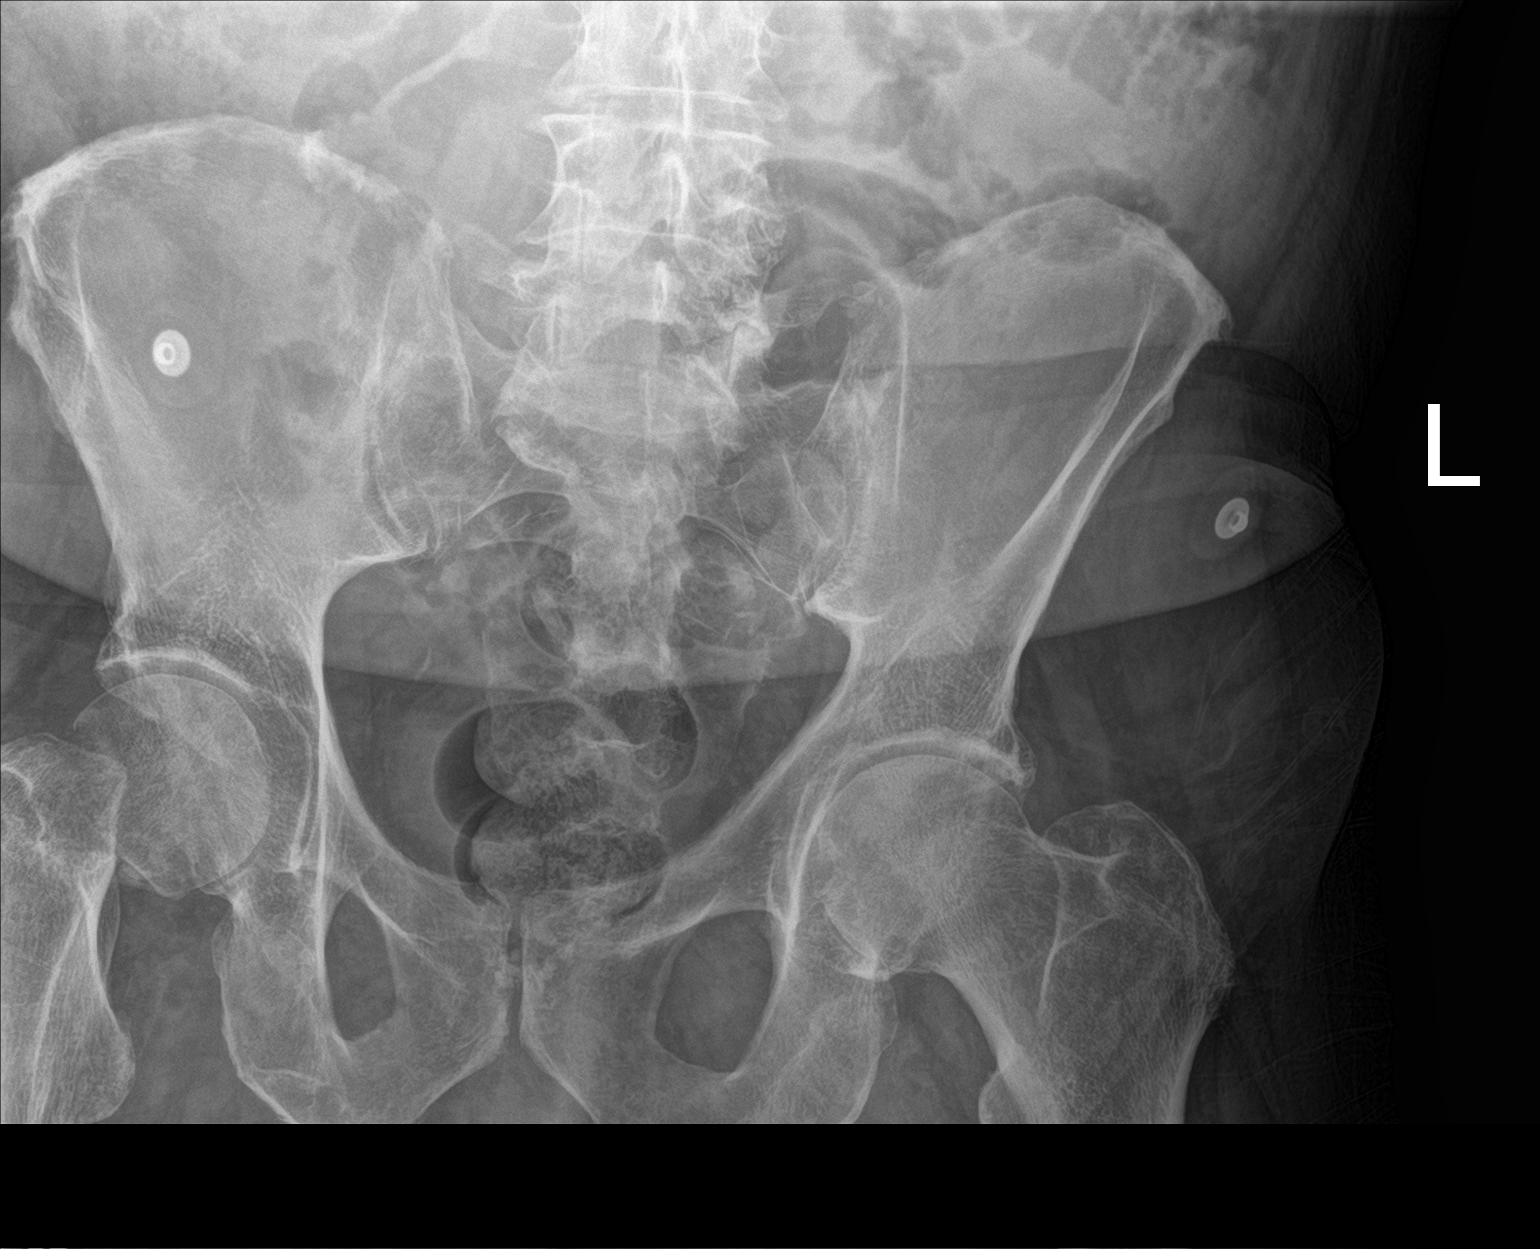

[hip ap]
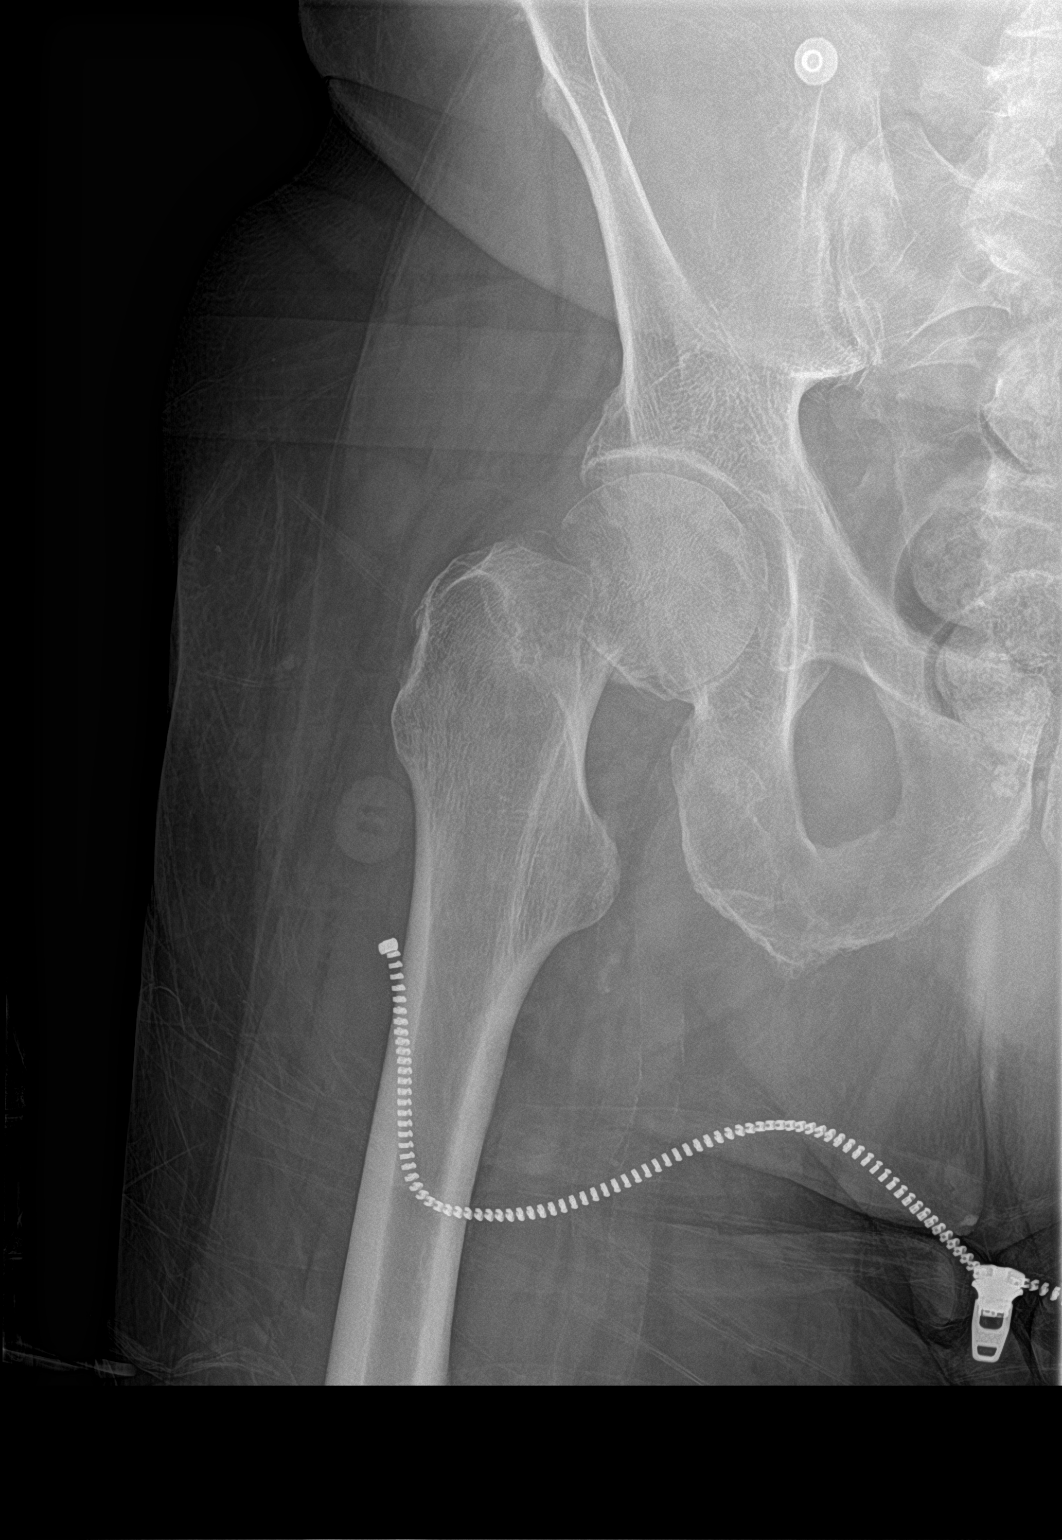

[hip lat]
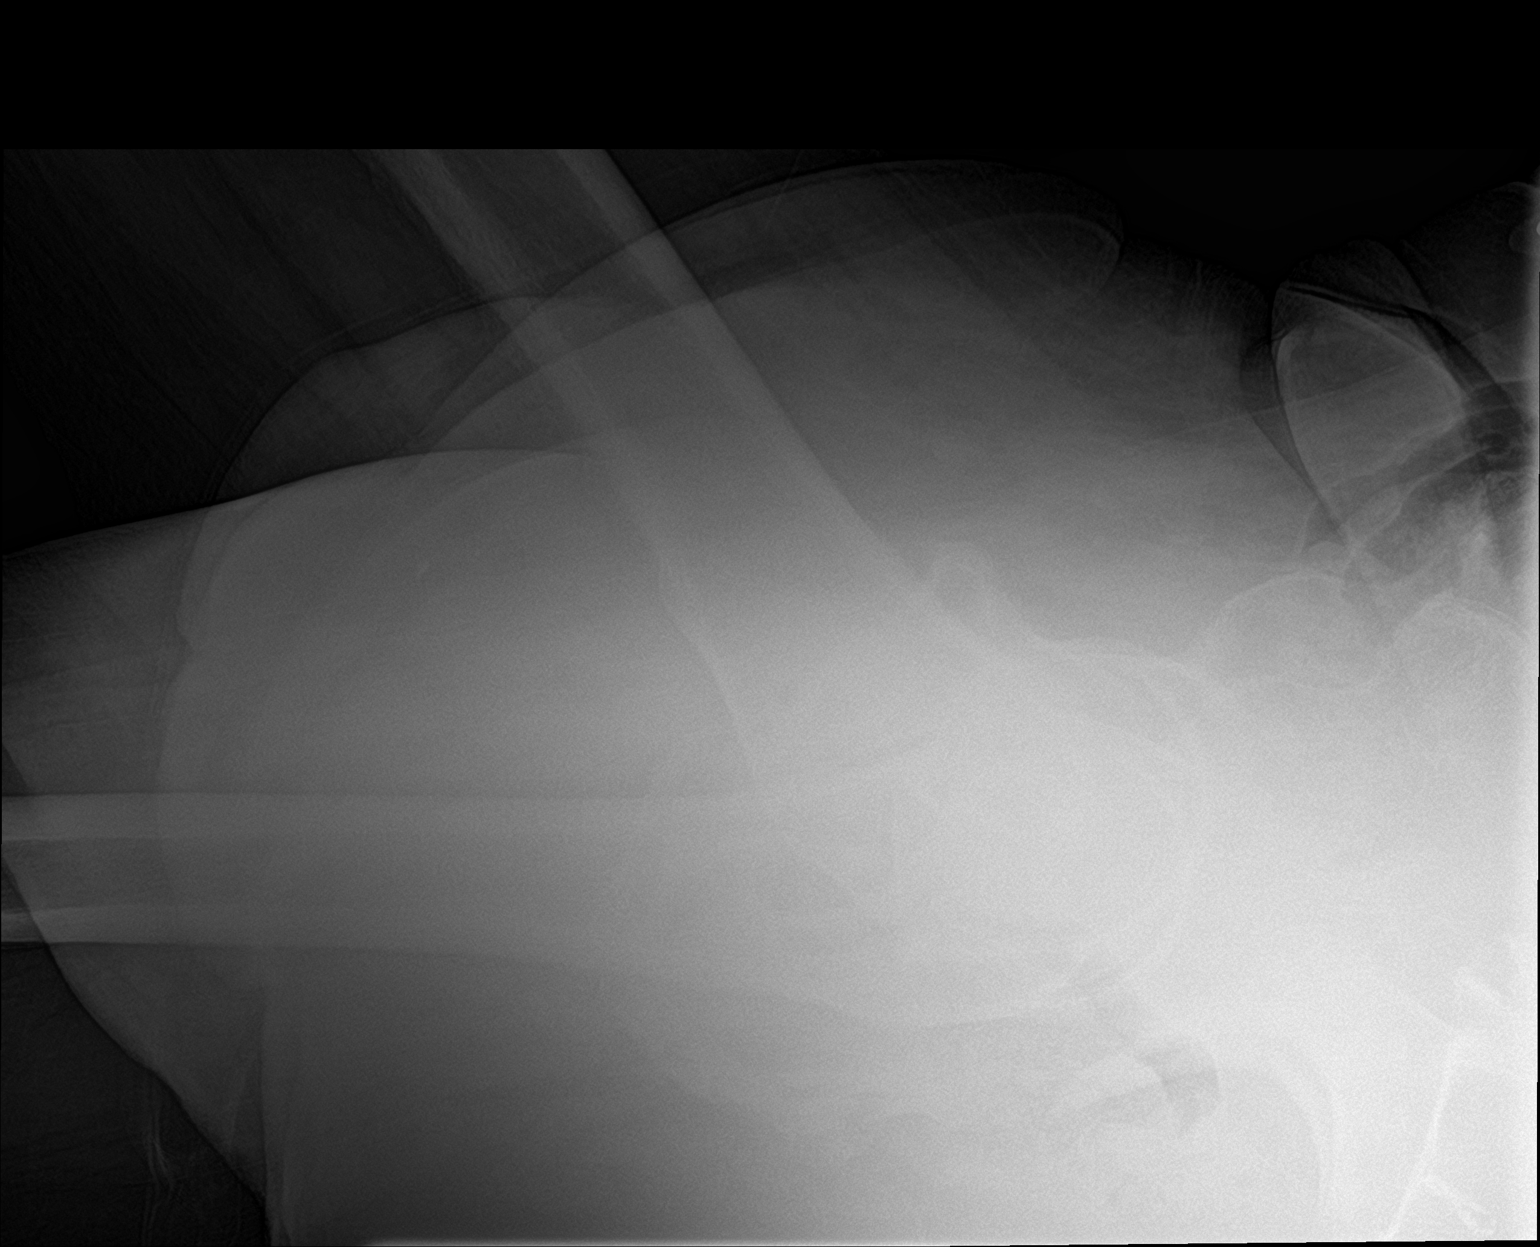

[3 of 3 positions shown; findings below may reference images not displayed]

FINDINGS: A right subcapital femoral neck fracture is present. Increased varus
angulation and slight foreshortening is present. The femoral head is
located. The pelvis is otherwise intact. Degenerative changes are
present in the lower lumbar spine.
IMPRESSION: 1. Right subcapital femoral neck fracture with increased angulation.

## 2021-12-24 NOTE — Progress Notes (Signed)
This encounter was created in error - please disregard.
